# Patient Record
Sex: Female | Born: 1960
Health system: Southern US, Community
[De-identification: ages and names within clinical notes are randomized; demographics above are authoritative.]

## PROBLEM LIST (undated history)

## (undated) DIAGNOSIS — R030 Elevated blood-pressure reading, without diagnosis of hypertension: Secondary | ICD-10-CM

## (undated) DIAGNOSIS — Z973 Presence of spectacles and contact lenses: Secondary | ICD-10-CM

## (undated) DIAGNOSIS — M810 Age-related osteoporosis without current pathological fracture: Secondary | ICD-10-CM

## (undated) DIAGNOSIS — Z8719 Personal history of other diseases of the digestive system: Secondary | ICD-10-CM

## (undated) DIAGNOSIS — K5790 Diverticulosis of intestine, part unspecified, without perforation or abscess without bleeding: Secondary | ICD-10-CM

## (undated) DIAGNOSIS — D259 Leiomyoma of uterus, unspecified: Secondary | ICD-10-CM

## (undated) DIAGNOSIS — N950 Postmenopausal bleeding: Secondary | ICD-10-CM

## (undated) DIAGNOSIS — M8580 Other specified disorders of bone density and structure, unspecified site: Secondary | ICD-10-CM

## (undated) DIAGNOSIS — K219 Gastro-esophageal reflux disease without esophagitis: Secondary | ICD-10-CM

## (undated) HISTORY — DX: Postmenopausal bleeding: N95.0

## (undated) HISTORY — PX: UMBILICAL HERNIA REPAIR: SHX196

## (undated) HISTORY — PX: ABDOMINOPLASTY: SUR9

## (undated) HISTORY — DX: Age-related osteoporosis without current pathological fracture: M81.0

## (undated) HISTORY — PX: EPIGASTRIC HERNIA REPAIR: SUR1177

## (undated) HISTORY — DX: Diverticulosis of intestine, part unspecified, without perforation or abscess without bleeding: K57.90

## (undated) HISTORY — PX: HYSTEROSCOPY WITH D & C: SHX1775

## (undated) HISTORY — PX: HERNIA REPAIR: SHX51

## (undated) HISTORY — DX: Leiomyoma of uterus, unspecified: D25.9

## (undated) HISTORY — DX: Other specified disorders of bone density and structure, unspecified site: M85.80

## (undated) HISTORY — PX: DILATION AND CURETTAGE, DIAGNOSTIC / THERAPEUTIC: SUR384

## (undated) HISTORY — PX: INGUINAL HERNIA REPAIR: SUR1180

## (undated) HISTORY — DX: Gastro-esophageal reflux disease without esophagitis: K21.9

## (undated) HISTORY — DX: Personal history of other diseases of the digestive system: Z87.19

## (undated) HISTORY — PX: DILATION AND CURETTAGE OF UTERUS: SHX78

---

## 1998-04-28 ENCOUNTER — Encounter: Payer: Self-pay | Admitting: Sports Medicine

## 1998-04-28 ENCOUNTER — Ambulatory Visit (HOSPITAL_COMMUNITY): Admission: RE | Admit: 1998-04-28 | Discharge: 1998-04-28 | Payer: Self-pay | Admitting: Sports Medicine

## 1999-05-17 ENCOUNTER — Encounter: Admission: RE | Admit: 1999-05-17 | Discharge: 1999-08-15 | Payer: Self-pay | Admitting: Family Medicine

## 1999-11-22 ENCOUNTER — Ambulatory Visit (HOSPITAL_COMMUNITY): Admission: RE | Admit: 1999-11-22 | Discharge: 1999-11-22 | Payer: Self-pay | Admitting: Obstetrics & Gynecology

## 1999-11-22 ENCOUNTER — Encounter: Payer: Self-pay | Admitting: Obstetrics & Gynecology

## 1999-12-06 ENCOUNTER — Other Ambulatory Visit: Admission: RE | Admit: 1999-12-06 | Discharge: 1999-12-06 | Payer: Self-pay | Admitting: Obstetrics and Gynecology

## 2000-02-14 ENCOUNTER — Encounter: Payer: Self-pay | Admitting: Obstetrics and Gynecology

## 2000-02-14 ENCOUNTER — Ambulatory Visit (HOSPITAL_COMMUNITY): Admission: RE | Admit: 2000-02-14 | Discharge: 2000-02-14 | Payer: Self-pay | Admitting: Obstetrics and Gynecology

## 2000-03-21 ENCOUNTER — Encounter: Admission: RE | Admit: 2000-03-21 | Discharge: 2000-03-21 | Payer: Self-pay | Admitting: Family Medicine

## 2000-04-03 ENCOUNTER — Encounter: Admission: RE | Admit: 2000-04-03 | Discharge: 2000-04-03 | Payer: Self-pay | Admitting: Family Medicine

## 2000-07-05 ENCOUNTER — Inpatient Hospital Stay (HOSPITAL_COMMUNITY): Admission: AD | Admit: 2000-07-05 | Discharge: 2000-07-08 | Payer: Self-pay | Admitting: Obstetrics and Gynecology

## 2000-07-05 ENCOUNTER — Encounter (INDEPENDENT_AMBULATORY_CARE_PROVIDER_SITE_OTHER): Payer: Self-pay | Admitting: Specialist

## 2000-11-08 ENCOUNTER — Encounter (HOSPITAL_BASED_OUTPATIENT_CLINIC_OR_DEPARTMENT_OTHER): Payer: Self-pay | Admitting: General Surgery

## 2000-11-13 ENCOUNTER — Encounter (INDEPENDENT_AMBULATORY_CARE_PROVIDER_SITE_OTHER): Payer: Self-pay | Admitting: *Deleted

## 2000-11-13 ENCOUNTER — Ambulatory Visit (HOSPITAL_COMMUNITY): Admission: RE | Admit: 2000-11-13 | Discharge: 2000-11-13 | Payer: Self-pay | Admitting: General Surgery

## 2000-12-31 ENCOUNTER — Encounter (INDEPENDENT_AMBULATORY_CARE_PROVIDER_SITE_OTHER): Payer: Self-pay | Admitting: *Deleted

## 2000-12-31 ENCOUNTER — Ambulatory Visit (HOSPITAL_COMMUNITY): Admission: RE | Admit: 2000-12-31 | Discharge: 2000-12-31 | Payer: Self-pay | Admitting: General Surgery

## 2001-05-01 ENCOUNTER — Other Ambulatory Visit: Admission: RE | Admit: 2001-05-01 | Discharge: 2001-05-01 | Payer: Self-pay | Admitting: Obstetrics and Gynecology

## 2001-08-21 ENCOUNTER — Encounter: Admission: RE | Admit: 2001-08-21 | Discharge: 2001-11-19 | Payer: Self-pay | Admitting: Sports Medicine

## 2001-10-29 ENCOUNTER — Encounter: Admission: RE | Admit: 2001-10-29 | Discharge: 2001-10-29 | Payer: Self-pay | Admitting: Sports Medicine

## 2001-12-03 ENCOUNTER — Inpatient Hospital Stay (HOSPITAL_COMMUNITY): Admission: RE | Admit: 2001-12-03 | Discharge: 2001-12-06 | Payer: Self-pay | Admitting: Plastic Surgery

## 2001-12-30 ENCOUNTER — Encounter: Admission: RE | Admit: 2001-12-30 | Discharge: 2001-12-30 | Payer: Self-pay | Admitting: Family Medicine

## 2002-03-03 ENCOUNTER — Encounter: Admission: RE | Admit: 2002-03-03 | Discharge: 2002-03-03 | Payer: Self-pay | Admitting: Family Medicine

## 2002-05-26 ENCOUNTER — Encounter: Admission: RE | Admit: 2002-05-26 | Discharge: 2002-05-26 | Payer: Self-pay | Admitting: Family Medicine

## 2002-07-08 ENCOUNTER — Ambulatory Visit (HOSPITAL_COMMUNITY): Admission: RE | Admit: 2002-07-08 | Discharge: 2002-07-08 | Payer: Self-pay | Admitting: Family Medicine

## 2002-07-08 ENCOUNTER — Encounter: Admission: RE | Admit: 2002-07-08 | Discharge: 2002-07-08 | Payer: Self-pay | Admitting: Family Medicine

## 2002-07-08 ENCOUNTER — Encounter: Payer: Self-pay | Admitting: Family Medicine

## 2002-07-17 ENCOUNTER — Encounter: Admission: RE | Admit: 2002-07-17 | Discharge: 2002-07-17 | Payer: Self-pay | Admitting: Family Medicine

## 2002-08-19 ENCOUNTER — Other Ambulatory Visit: Admission: RE | Admit: 2002-08-19 | Discharge: 2002-08-19 | Payer: Self-pay | Admitting: Gynecology

## 2003-05-26 ENCOUNTER — Ambulatory Visit (HOSPITAL_COMMUNITY): Admission: RE | Admit: 2003-05-26 | Discharge: 2003-05-26 | Payer: Self-pay | Admitting: Family Medicine

## 2003-05-26 ENCOUNTER — Encounter: Admission: RE | Admit: 2003-05-26 | Discharge: 2003-05-26 | Payer: Self-pay | Admitting: Family Medicine

## 2003-10-27 ENCOUNTER — Other Ambulatory Visit: Admission: RE | Admit: 2003-10-27 | Discharge: 2003-10-27 | Payer: Self-pay | Admitting: Gynecology

## 2003-11-06 ENCOUNTER — Encounter: Admission: RE | Admit: 2003-11-06 | Discharge: 2003-11-06 | Payer: Self-pay | Admitting: Sports Medicine

## 2003-12-31 ENCOUNTER — Ambulatory Visit (HOSPITAL_COMMUNITY): Admission: RE | Admit: 2003-12-31 | Discharge: 2003-12-31 | Payer: Self-pay | Admitting: *Deleted

## 2003-12-31 IMAGING — US US ABDOMEN COMPLETE
1 series · 14 of 25 positions shown · non-contrast
Comparison: none

CLINICAL DATA: Patient has epigastric pain.
 ULTRASOUND OF THE ABDOMEN
 The gallbladder is well distended without evidence of stones.  Gallbladder wall is normal measuring 1.9mm.  Common bile duct is normal measuring 4mm.  Liver, IVC, pancreas and spleen are unremarkable.  Right kidney measures 10.4cm.  Left kidney measures 11.1cm with a 3 x 2mm echogenic area in the lower pole of the left kidney.  This may well represent a small stone or an angiomyolipoma.  Aorta is normal measuring 1.7cm. 
 IMPRESSION
 No gallstones.
 Small echogenic area of the left kidney.  Followup study would be suggested in six months for further evaluation of the left kidney.

[Series 1: unknown · 0.30mm/px · 14 of 93 slices shown]
[im 1/93]
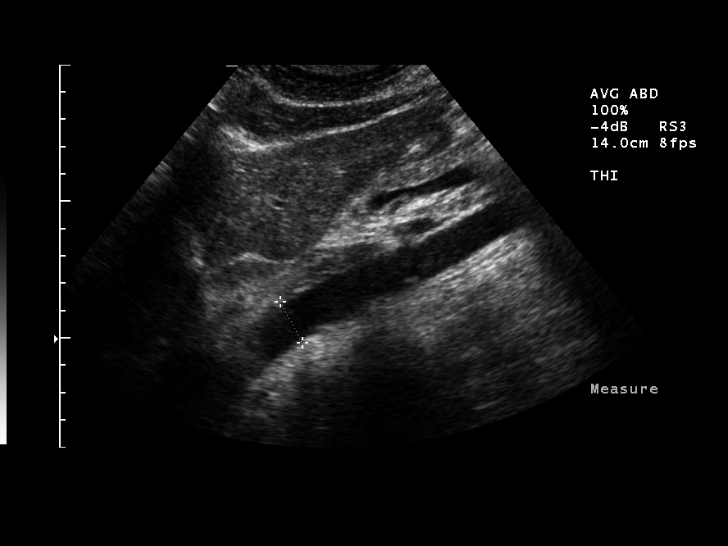
[im 8/93]
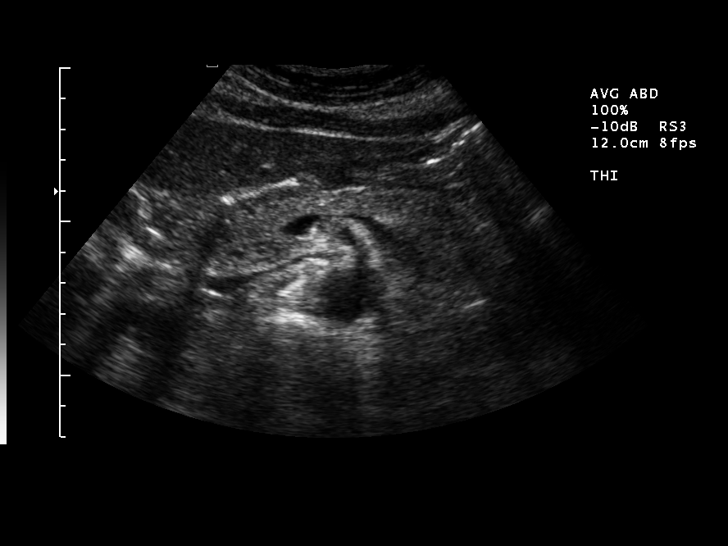
[im 16/93]
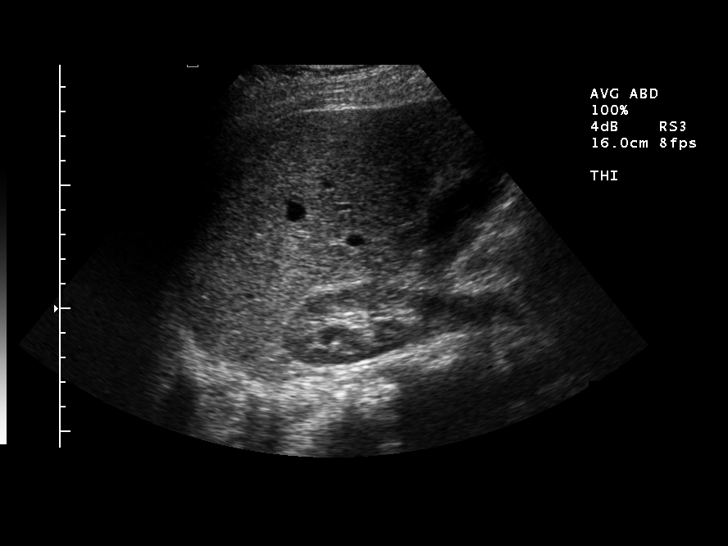
[im 24/93]
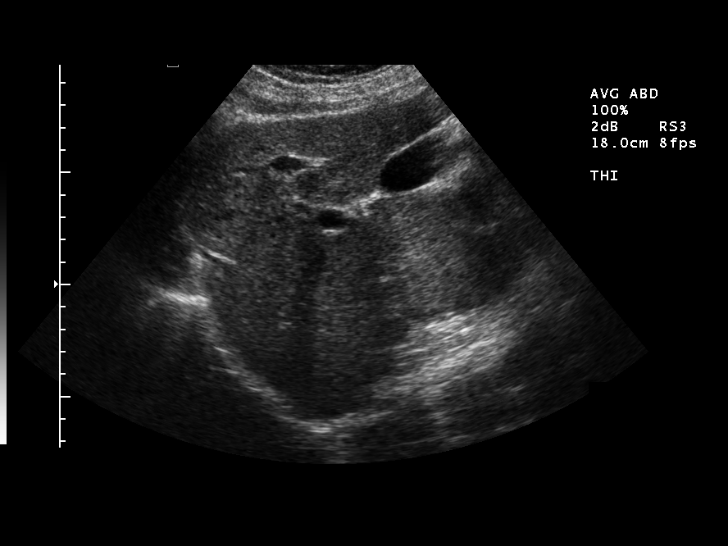
[im 31/93]
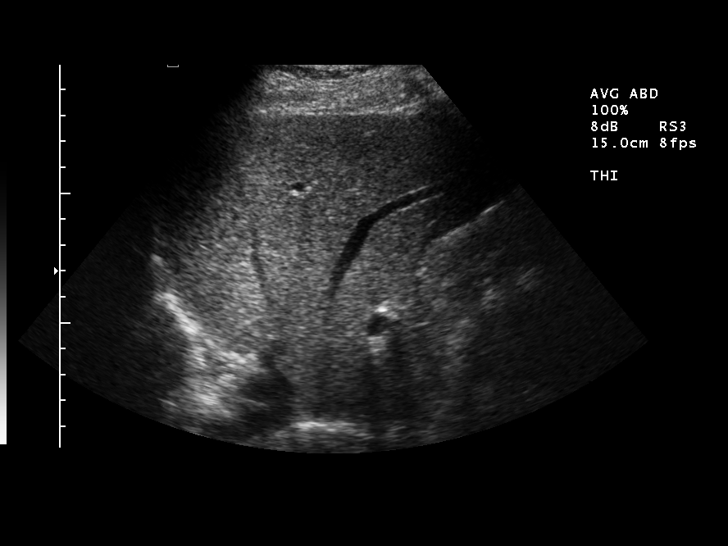
[im 35/93]
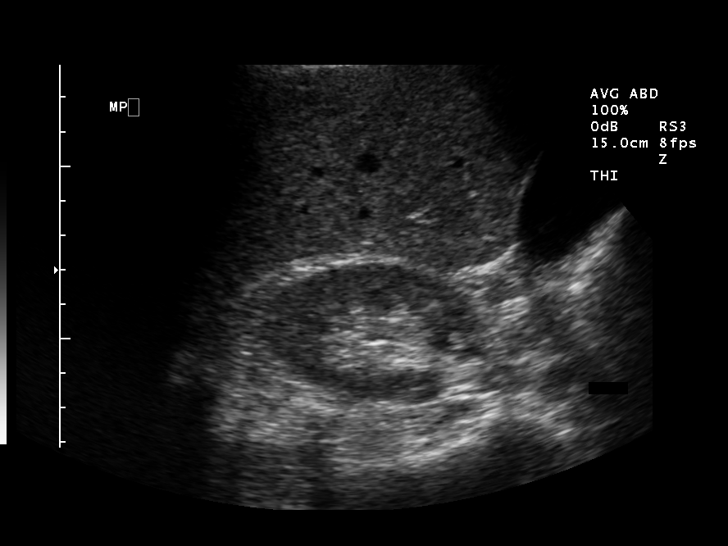
[im 43/93]
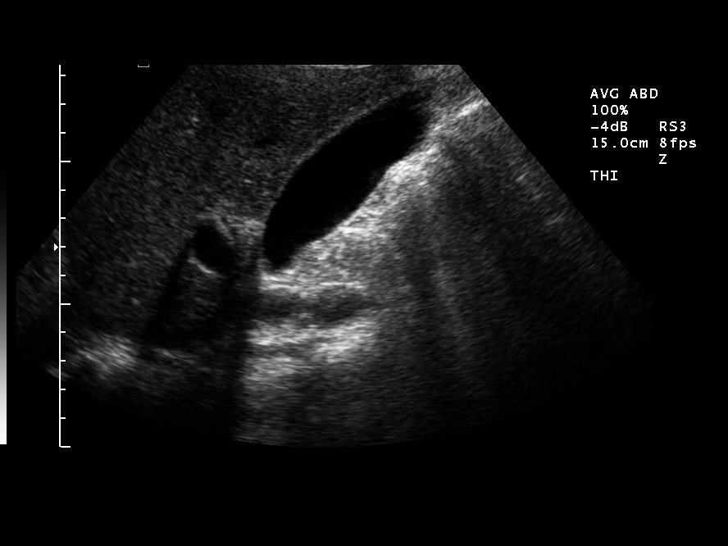
[im 50/93]
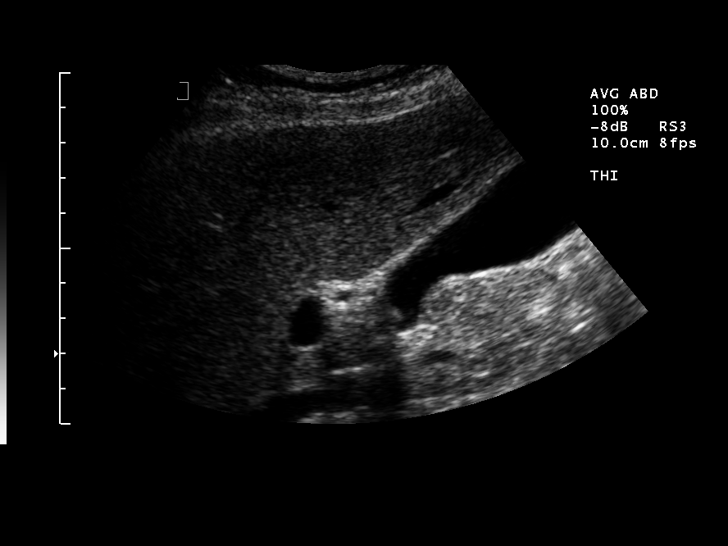
[im 58/93]
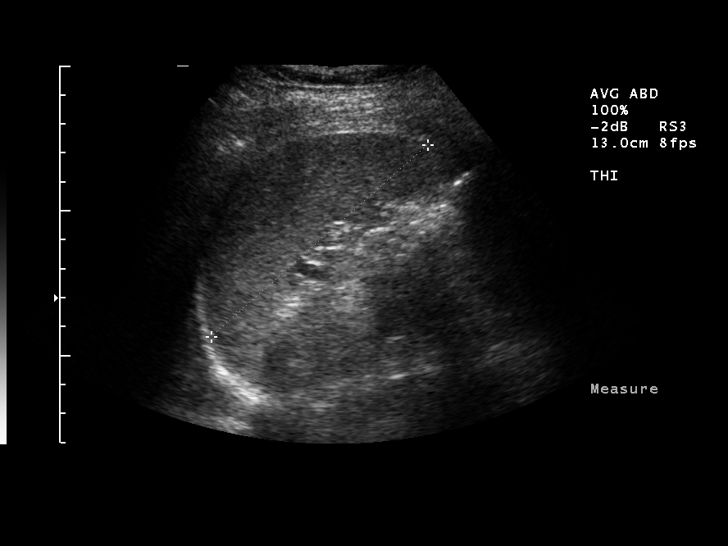
[im 62/93]
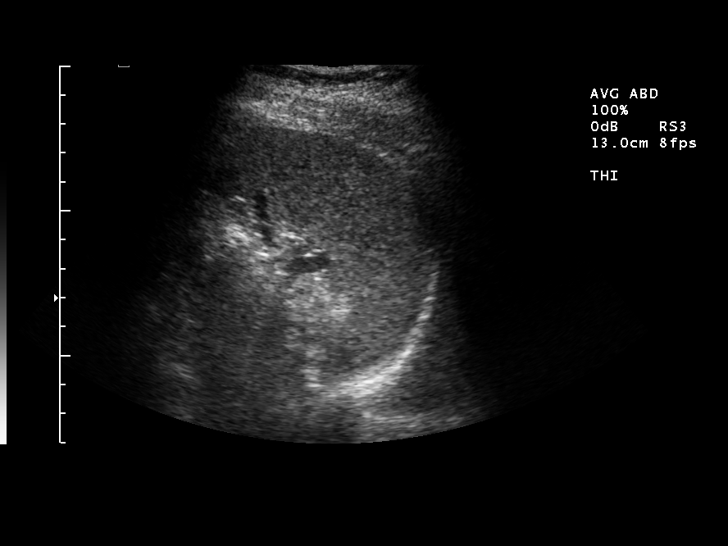
[im 70/93]
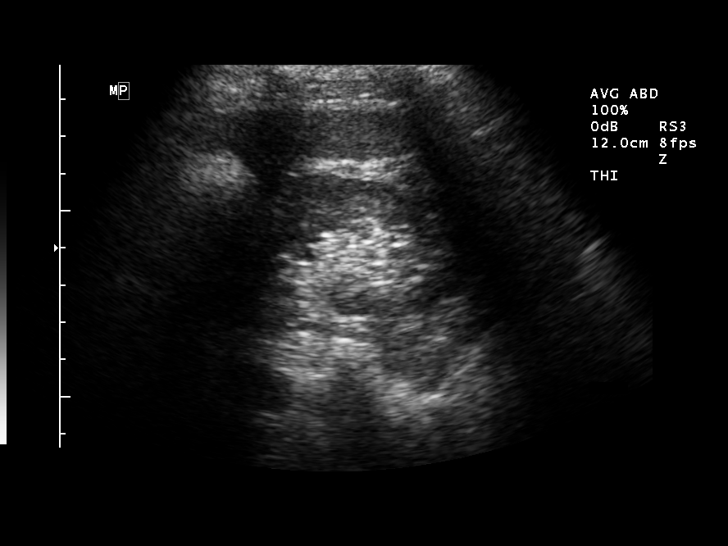
[im 77/93]
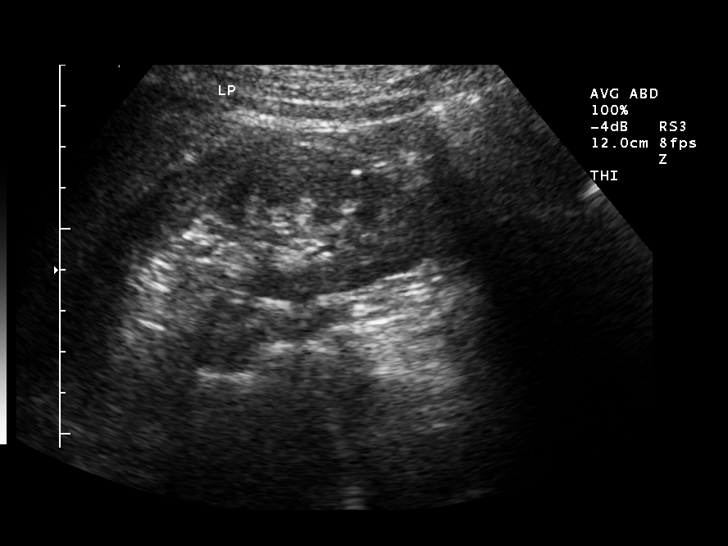
[im 85/93]
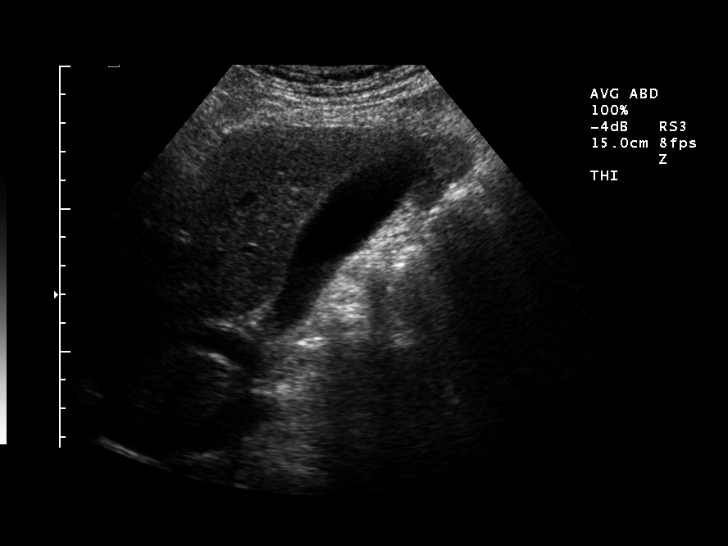
[im 93/93]
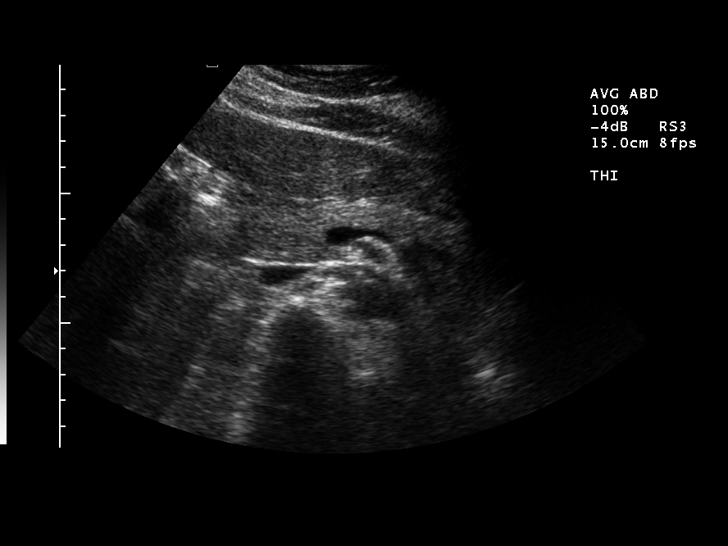

[14 of 25 positions shown; findings below may reference images not displayed]

## 2004-01-07 ENCOUNTER — Ambulatory Visit (HOSPITAL_COMMUNITY): Admission: RE | Admit: 2004-01-07 | Discharge: 2004-01-07 | Payer: Self-pay | Admitting: *Deleted

## 2004-01-07 ENCOUNTER — Encounter (INDEPENDENT_AMBULATORY_CARE_PROVIDER_SITE_OTHER): Payer: Self-pay | Admitting: *Deleted

## 2004-02-16 ENCOUNTER — Ambulatory Visit: Payer: Self-pay | Admitting: Family Medicine

## 2004-04-28 ENCOUNTER — Ambulatory Visit (HOSPITAL_COMMUNITY): Admission: RE | Admit: 2004-04-28 | Discharge: 2004-04-28 | Payer: Self-pay | Admitting: Family Medicine

## 2004-04-28 IMAGING — CT CT ABDOMEN WO/W CM
2 of 4 series · 14 of 32 positions shown, 19 images · IV contrast (omnipaque)
Comparison: Ultrasound of the abdomen [DATE].

CLINICAL DATA: Follow-up left kidney abnormality on ultrasound.
 CT ABDOMEN WITHOUT AND WITH CONTRAST:
 150 cc Omnipaque 300 IV.

[Series 2: abdomen w/o 5.0 b30f · axial · non-contrast · 0.62mm/px · z∈[-370,-230]mm · 6 of 45 slices shown]
[im 6/45  soft-tissue]
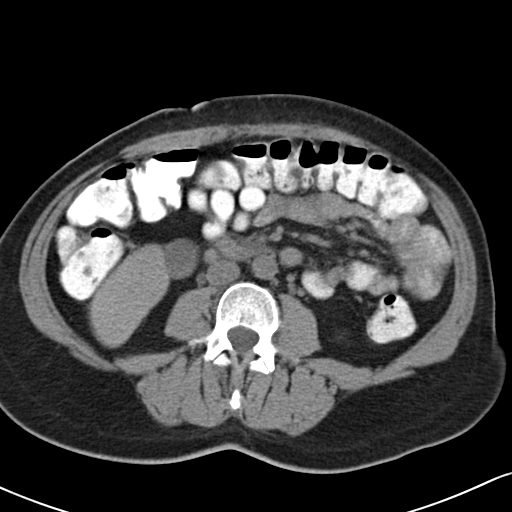
[im 12/45  soft-tissue]
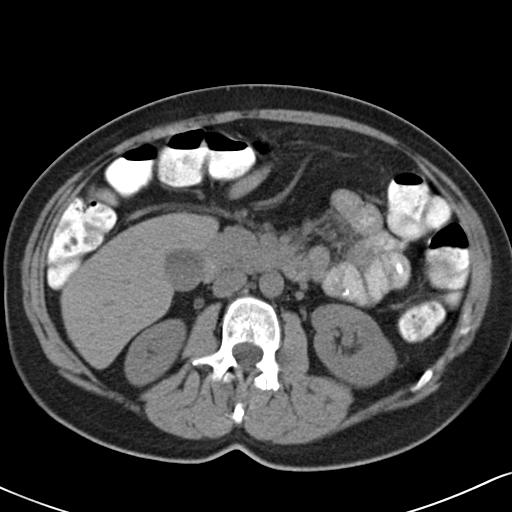
[im 17/45  soft-tissue]
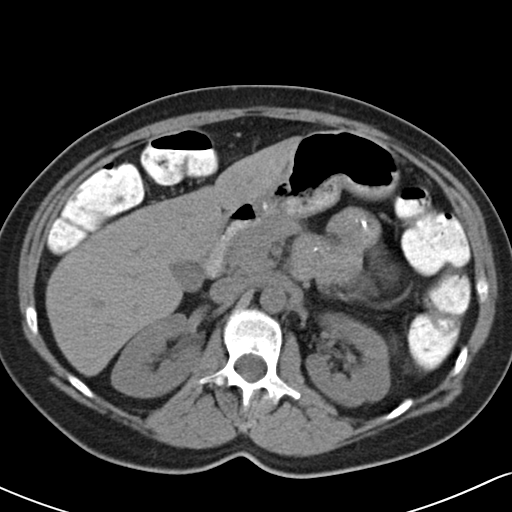
[im 23/45  soft-tissue]
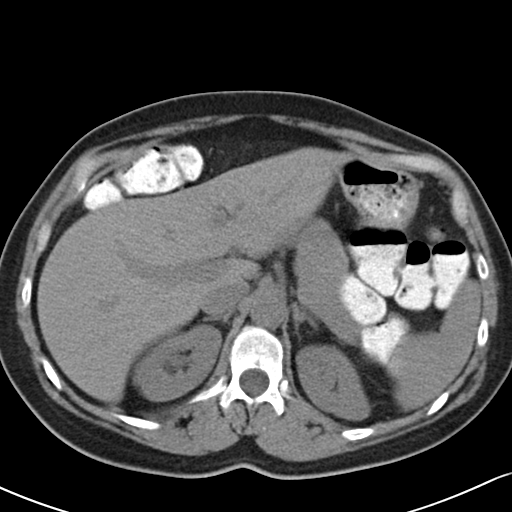
[im 28/45  soft-tissue]
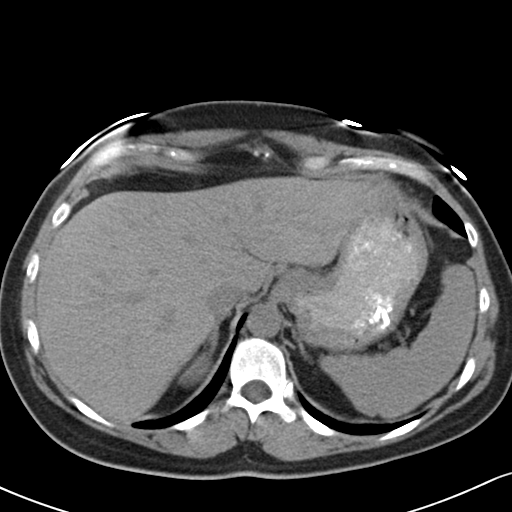
[im 34/45  soft-tissue]
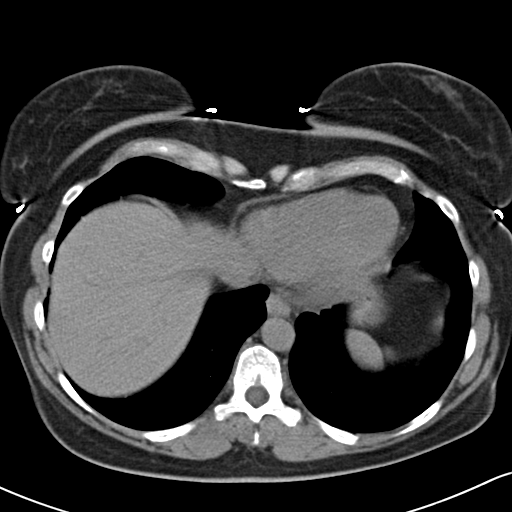

[Series 3: abdomen 5.0 b30f · axial · 0.62mm/px · z∈[-380,-205]mm · 8 of 47 slices shown, 13 images]
[im 6/47  soft-tissue]
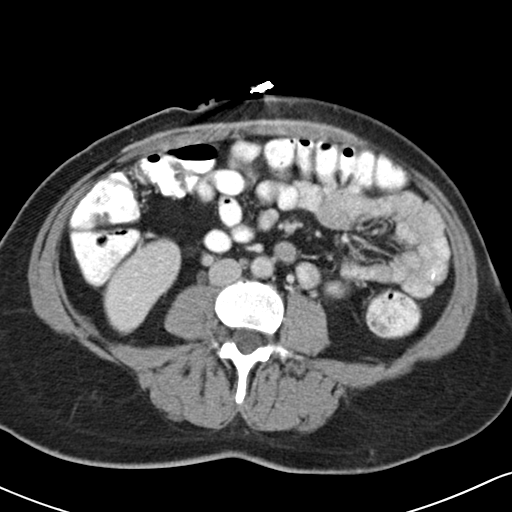
[im 6/47  bone]
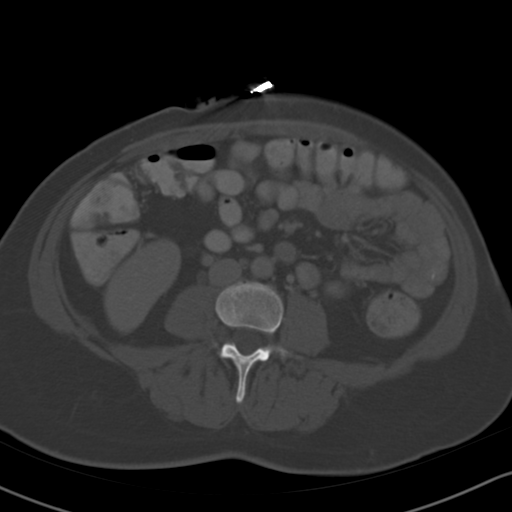
[im 11/47  soft-tissue]
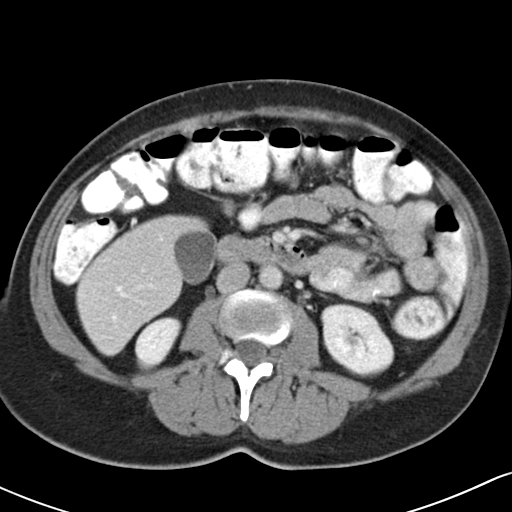
[im 16/47  soft-tissue]
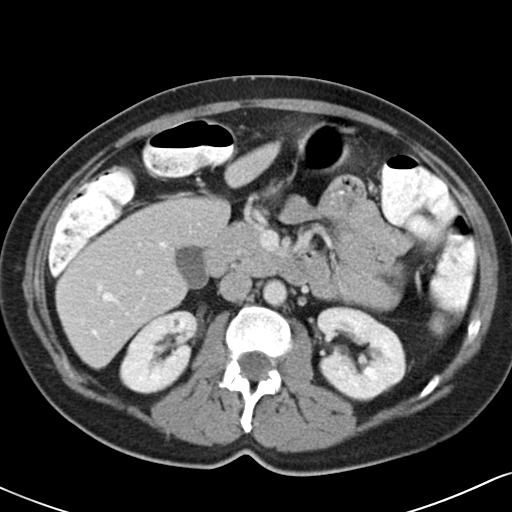
[im 21/47  soft-tissue]
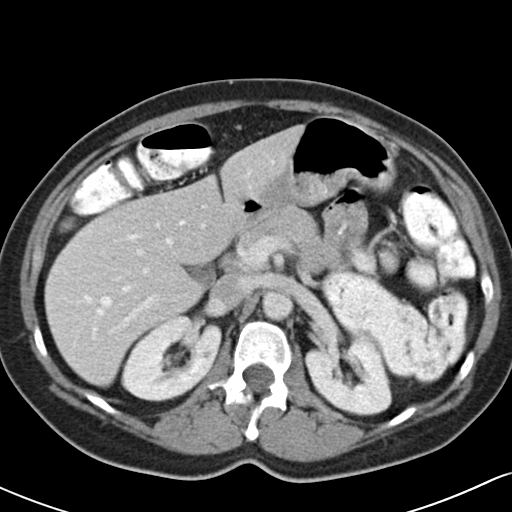
[im 26/47  soft-tissue]
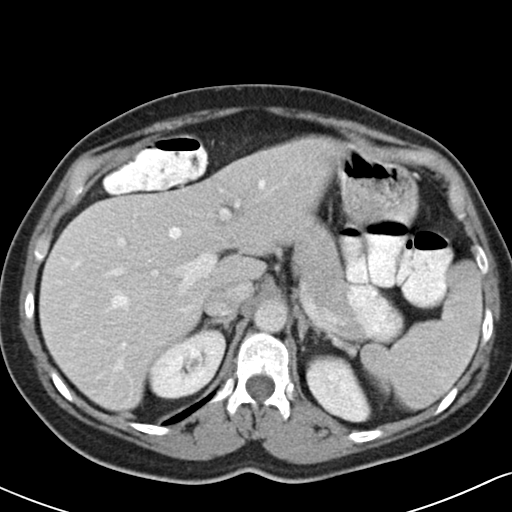
[im 26/47  lung]
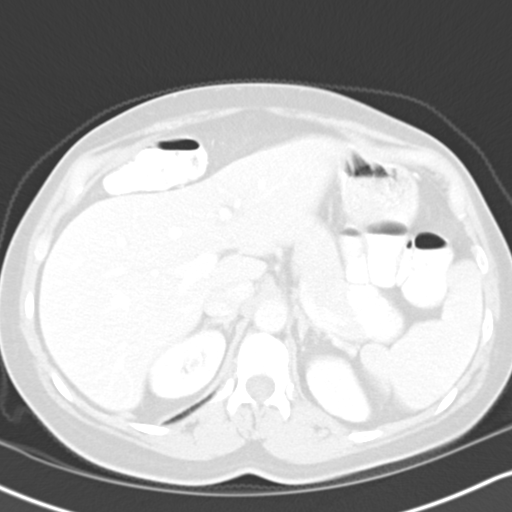
[im 31/47  soft-tissue]
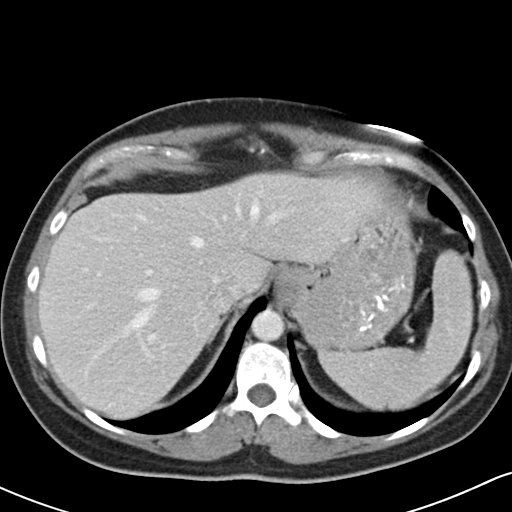
[im 31/47  lung]
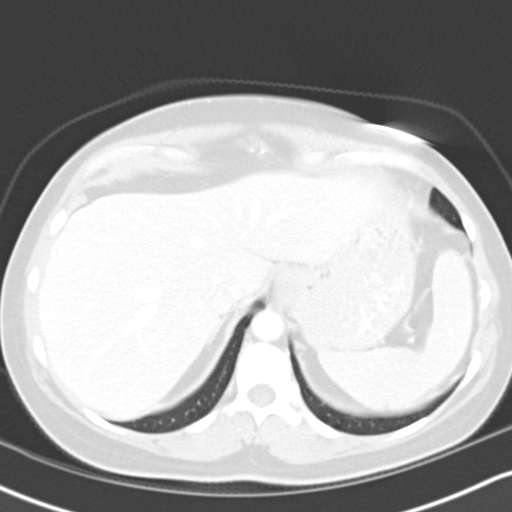
[im 36/47  soft-tissue]
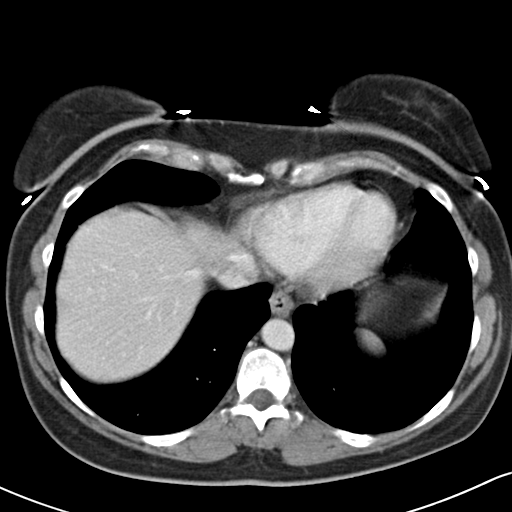
[im 36/47  lung]
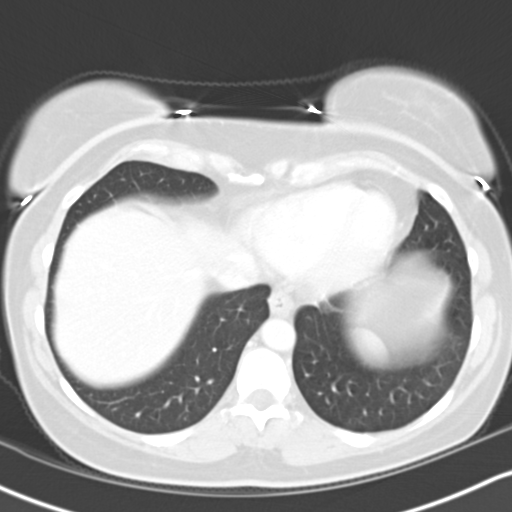
[im 41/47  soft-tissue]
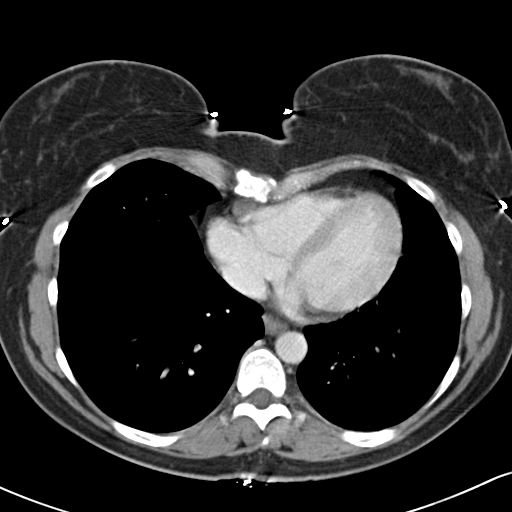
[im 41/47  lung]
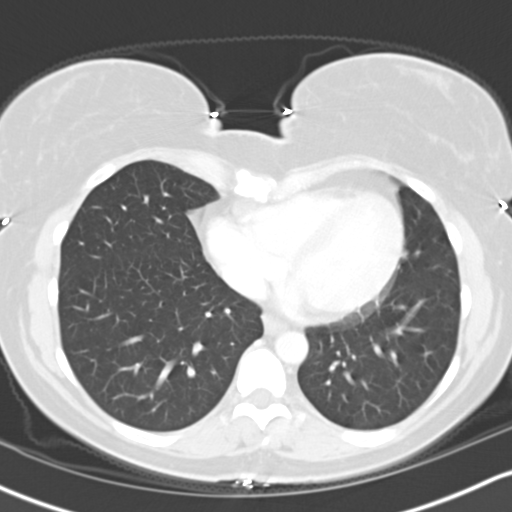

[14 of 32 positions shown; findings below may reference images not displayed]

The prior ultrasound showed a 3 mm echogenic non-shadowing focus in the left lower renal cortex.  No abnormality is identified on this area on CT.  Specifically, there is no renal calcification, cyst, or fatty mass.  Etiology is uncertain but this may represent a small complex cyst, which has resolved, possibly a small fatty tumor which is not visualized on CT.  
 There is a 4 mm low density lesion in the right mid renal medial cortex seen on the CT but not seen in retrospect on the ultrasound.  This is most likely a benign lesion and probably a small cyst.  It has density measurements between approximately 2 and 25.  No renal calculi are identified.  There is no hydronephrosis and no other renal lesions are identified.  
 The liver and spleen are normal.  The lung bases are normal.  The pancreas is normal.  There is no adenopathy or fluid.  The bowel is not dilated.  The hepatic flexure of the colon does extend anterior to the liver, which is a normal variant.
IMPRESSION: 1.  No renal lesion is seen on the left kidney corresponding to the ultrasound abnormality.  I doubt that this represents a significant finding with discussion of above.  
 2  4 mm lesion in the right kidney, which is most likely a small cyst, which is not seen on the recent ultrasound.

## 2005-05-04 ENCOUNTER — Other Ambulatory Visit: Admission: RE | Admit: 2005-05-04 | Discharge: 2005-05-04 | Payer: Self-pay | Admitting: Gynecology

## 2005-09-15 ENCOUNTER — Encounter: Admission: RE | Admit: 2005-09-15 | Discharge: 2005-09-15 | Payer: Self-pay | Admitting: Family Medicine

## 2006-05-17 ENCOUNTER — Ambulatory Visit: Payer: Self-pay | Admitting: Family Medicine

## 2006-08-10 ENCOUNTER — Ambulatory Visit: Payer: Self-pay | Admitting: Family Medicine

## 2006-08-10 LAB — CONVERTED CEMR LAB: Rapid Strep: NEGATIVE

## 2006-08-11 ENCOUNTER — Encounter: Payer: Self-pay | Admitting: Family Medicine

## 2006-09-17 ENCOUNTER — Encounter: Admission: RE | Admit: 2006-09-17 | Discharge: 2006-09-17 | Payer: Self-pay | Admitting: Family Medicine

## 2006-09-18 ENCOUNTER — Encounter: Payer: Self-pay | Admitting: Family Medicine

## 2006-12-11 ENCOUNTER — Ambulatory Visit: Payer: Self-pay | Admitting: Family Medicine

## 2006-12-12 ENCOUNTER — Encounter: Payer: Self-pay | Admitting: Family Medicine

## 2006-12-12 LAB — CONVERTED CEMR LAB
Albumin: 4.4 g/dL (ref 3.5–5.2)
Creatinine, Ser: 0.74 mg/dL (ref 0.40–1.20)
Glucose, Bld: 136 mg/dL — ABNORMAL HIGH (ref 70–99)
Potassium: 4.3 meq/L (ref 3.5–5.3)
Total Bilirubin: 0.5 mg/dL (ref 0.3–1.2)

## 2007-02-26 ENCOUNTER — Ambulatory Visit: Payer: Self-pay | Admitting: Family Medicine

## 2007-02-26 DIAGNOSIS — R109 Unspecified abdominal pain: Secondary | ICD-10-CM | POA: Insufficient documentation

## 2007-02-26 LAB — CONVERTED CEMR LAB
AST: 15 units/L (ref 0–37)
BUN: 9 mg/dL (ref 6–23)
Chloride: 103 meq/L (ref 96–112)
Hemoglobin: 13.7 g/dL (ref 12.0–15.0)
MCV: 88.5 fL (ref 78.0–100.0)
Platelets: 307 10*3/uL (ref 150–400)
Potassium: 4 meq/L (ref 3.5–5.3)
Sodium: 139 meq/L (ref 135–145)
Total Protein: 6.8 g/dL (ref 6.0–8.3)
WBC: 5.8 10*3/uL (ref 4.0–10.5)

## 2007-05-03 ENCOUNTER — Encounter: Payer: Self-pay | Admitting: Family Medicine

## 2007-05-09 ENCOUNTER — Ambulatory Visit: Payer: Self-pay | Admitting: Family Medicine

## 2007-05-09 DIAGNOSIS — M25579 Pain in unspecified ankle and joints of unspecified foot: Secondary | ICD-10-CM | POA: Insufficient documentation

## 2007-06-05 ENCOUNTER — Telehealth: Payer: Self-pay | Admitting: Family Medicine

## 2007-06-07 ENCOUNTER — Ambulatory Visit: Payer: Self-pay | Admitting: Family Medicine

## 2007-06-07 ENCOUNTER — Ambulatory Visit (HOSPITAL_COMMUNITY): Admission: RE | Admit: 2007-06-07 | Discharge: 2007-06-07 | Payer: Self-pay | Admitting: Family Medicine

## 2007-06-10 ENCOUNTER — Telehealth: Payer: Self-pay | Admitting: *Deleted

## 2007-06-11 ENCOUNTER — Encounter: Payer: Self-pay | Admitting: Family Medicine

## 2007-06-26 ENCOUNTER — Encounter: Payer: Self-pay | Admitting: Family Medicine

## 2007-07-02 ENCOUNTER — Ambulatory Visit: Payer: Self-pay | Admitting: Family Medicine

## 2007-07-03 ENCOUNTER — Ambulatory Visit (HOSPITAL_COMMUNITY): Admission: RE | Admit: 2007-07-03 | Discharge: 2007-07-03 | Payer: Self-pay | Admitting: Interventional Cardiology

## 2007-07-03 ENCOUNTER — Encounter (INDEPENDENT_AMBULATORY_CARE_PROVIDER_SITE_OTHER): Payer: Self-pay | Admitting: Interventional Cardiology

## 2007-07-11 DIAGNOSIS — E669 Obesity, unspecified: Secondary | ICD-10-CM | POA: Insufficient documentation

## 2007-08-16 ENCOUNTER — Encounter: Payer: Self-pay | Admitting: Family Medicine

## 2007-08-30 ENCOUNTER — Ambulatory Visit: Payer: Self-pay | Admitting: Sports Medicine

## 2007-08-30 ENCOUNTER — Encounter: Admission: RE | Admit: 2007-08-30 | Discharge: 2007-08-30 | Payer: Self-pay | Admitting: Sports Medicine

## 2007-08-30 DIAGNOSIS — M2170 Unequal limb length (acquired), unspecified site: Secondary | ICD-10-CM

## 2007-08-30 DIAGNOSIS — M4130 Thoracogenic scoliosis, site unspecified: Secondary | ICD-10-CM | POA: Insufficient documentation

## 2007-08-30 DIAGNOSIS — M79673 Pain in unspecified foot: Secondary | ICD-10-CM | POA: Insufficient documentation

## 2007-08-30 DIAGNOSIS — M2020 Hallux rigidus, unspecified foot: Secondary | ICD-10-CM

## 2007-08-30 HISTORY — DX: Thoracogenic scoliosis, site unspecified: M41.30

## 2007-08-30 HISTORY — DX: Unequal limb length (acquired), unspecified site: M21.70

## 2007-08-30 IMAGING — CR DG FOOT COMPLETE 3+V*L*
3 series · 3 of 3 positions shown · non-contrast
Comparison: None

CLINICAL DATA: Pain, no acute injury

LEFT FOOT - COMPLETE 3+ VIEW

[view not recorded (1 of 3)]
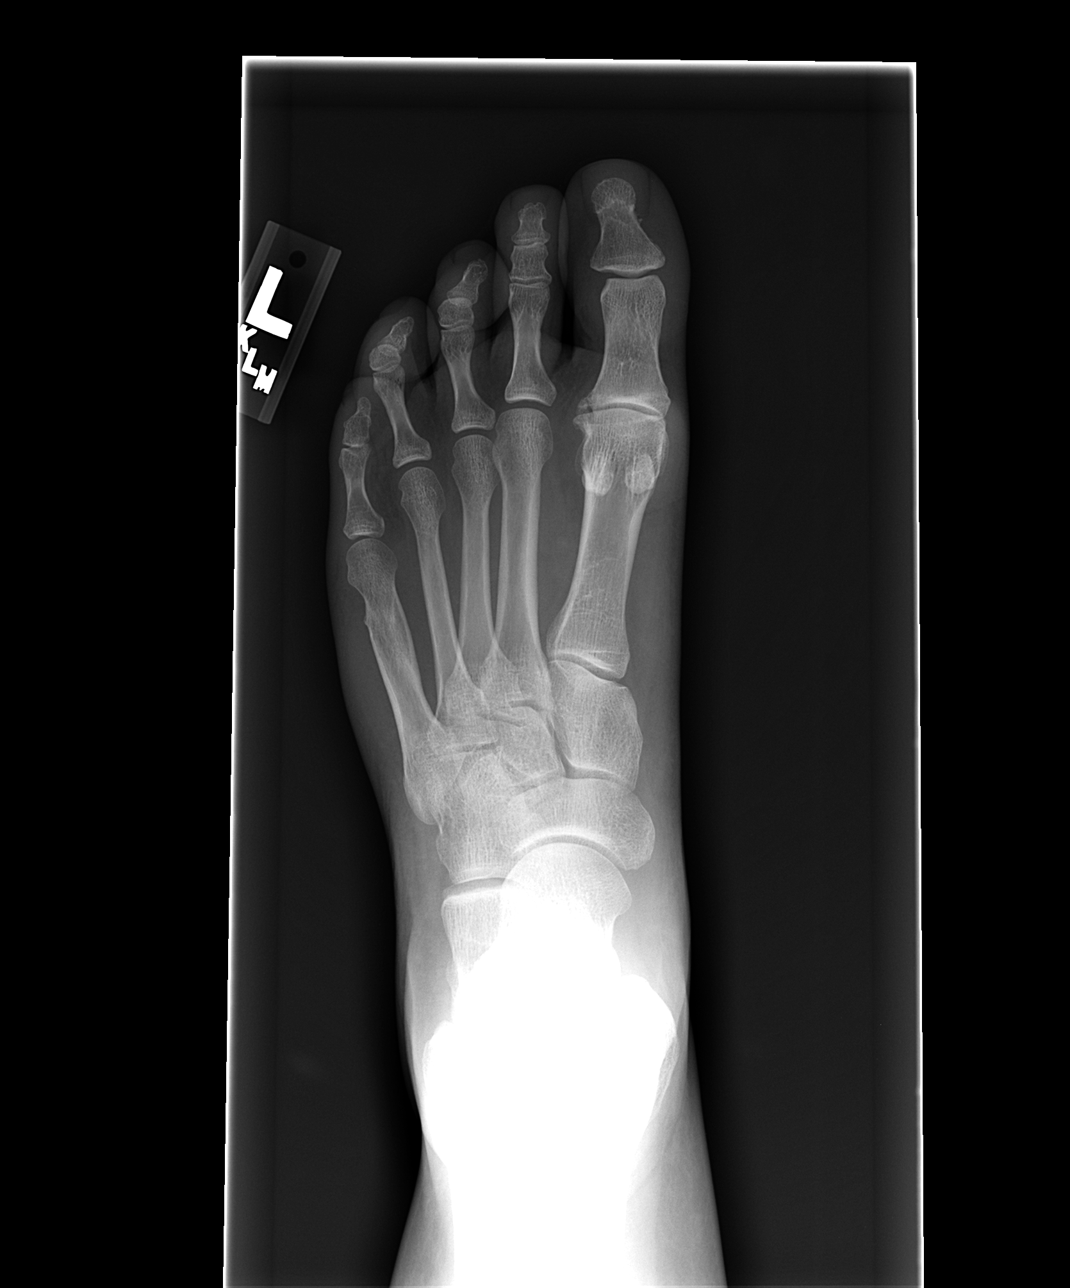

[view not recorded (2 of 3)]
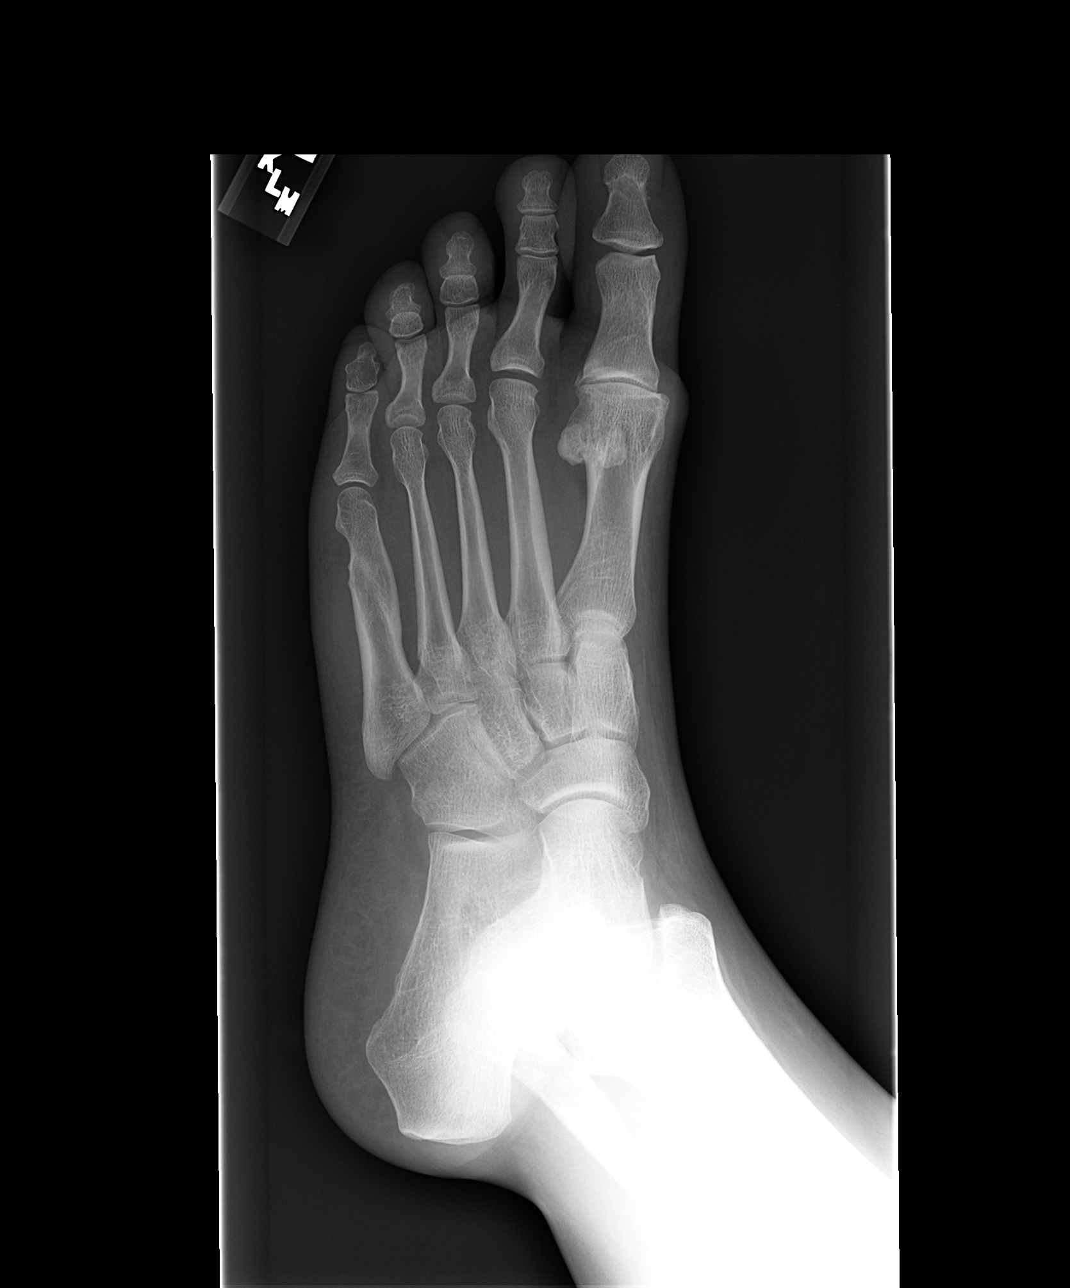

[view not recorded (3 of 3)]
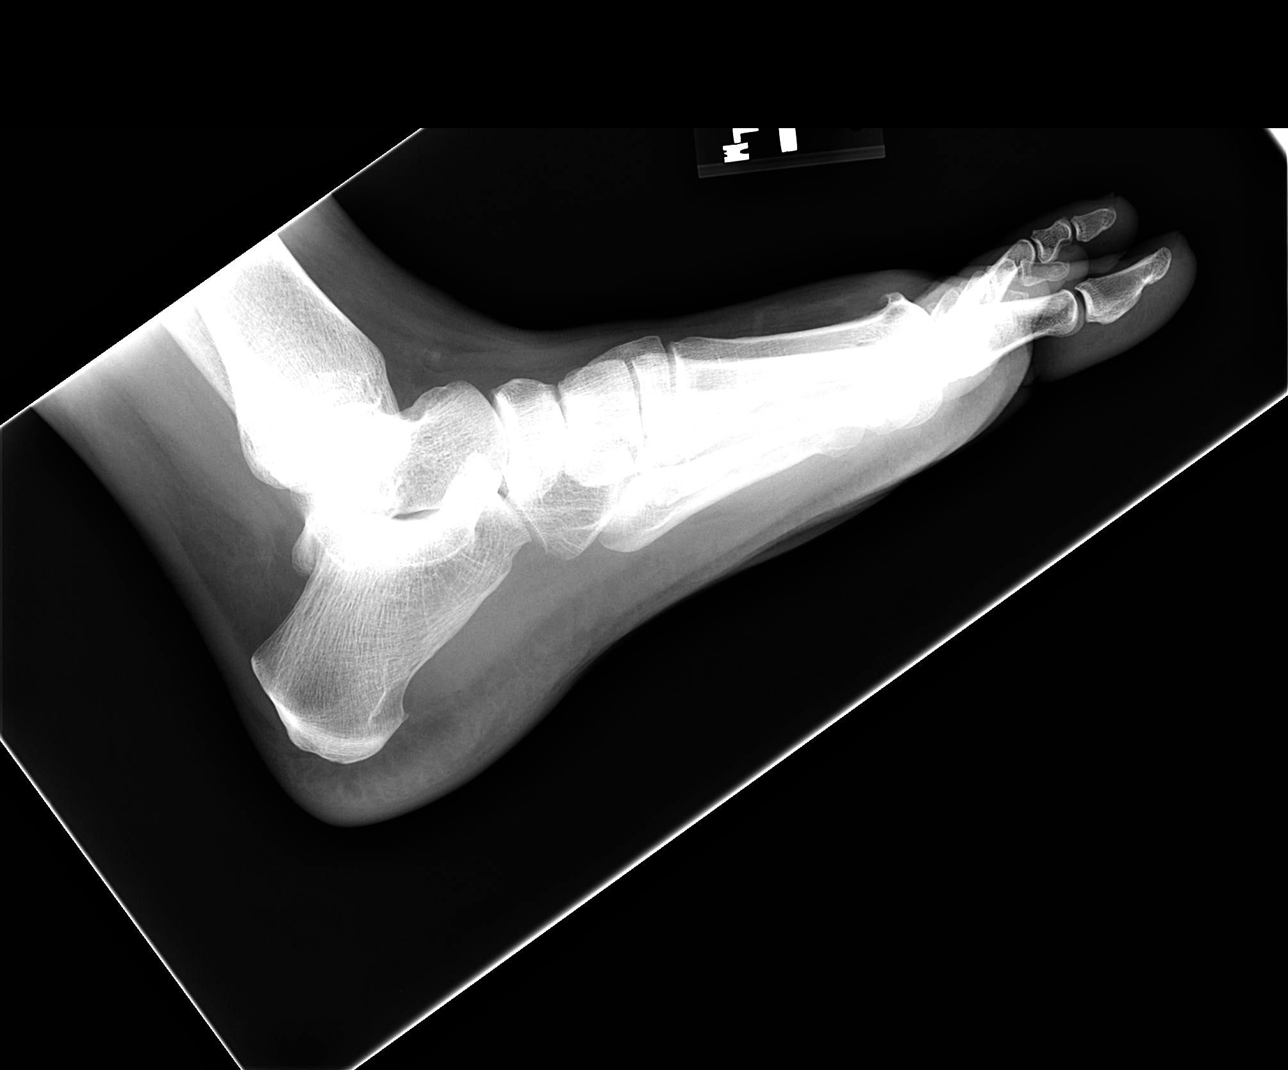

[3 of 3 positions shown; findings below may reference images not displayed]

FINDINGS: There is an old healed fracture of the distal left fifth
metatarsal.  There is degenerative change at the left first MTP
joint with loss of joint space sclerosis and spur formation.  There
is some cortical irregularity of the lateral aspect of the base of
the proximal phalanx of the left great toe which could represent
small fracture.
IMPRESSION: 1.  Cannot include fracture of the base of the proximal phalanx of
the left great toe laterally with soft tissue swelling and
degenerative change present.
2.  Old healed fracture of the left fifth metatarsal.

## 2007-09-26 ENCOUNTER — Encounter: Admission: RE | Admit: 2007-09-26 | Discharge: 2007-09-26 | Payer: Self-pay | Admitting: Family Medicine

## 2007-10-16 ENCOUNTER — Encounter: Admission: RE | Admit: 2007-10-16 | Discharge: 2007-10-16 | Payer: Self-pay | Admitting: *Deleted

## 2007-10-16 ENCOUNTER — Ambulatory Visit: Payer: Self-pay | Admitting: Family Medicine

## 2007-10-16 DIAGNOSIS — M542 Cervicalgia: Secondary | ICD-10-CM | POA: Insufficient documentation

## 2007-10-16 DIAGNOSIS — M25539 Pain in unspecified wrist: Secondary | ICD-10-CM | POA: Insufficient documentation
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
## 2007-10-17 ENCOUNTER — Encounter (INDEPENDENT_AMBULATORY_CARE_PROVIDER_SITE_OTHER): Payer: Self-pay | Admitting: *Deleted

## 2008-05-06 ENCOUNTER — Other Ambulatory Visit: Admission: RE | Admit: 2008-05-06 | Discharge: 2008-05-06 | Payer: Self-pay | Admitting: Gynecology

## 2008-05-08 ENCOUNTER — Encounter: Payer: Self-pay | Admitting: Family Medicine

## 2008-06-10 ENCOUNTER — Encounter (INDEPENDENT_AMBULATORY_CARE_PROVIDER_SITE_OTHER): Payer: Self-pay | Admitting: *Deleted

## 2008-06-10 ENCOUNTER — Ambulatory Visit: Payer: Self-pay | Admitting: Sports Medicine

## 2008-06-10 DIAGNOSIS — M25569 Pain in unspecified knee: Secondary | ICD-10-CM

## 2008-08-03 ENCOUNTER — Encounter: Payer: Self-pay | Admitting: Family Medicine

## 2008-08-04 ENCOUNTER — Ambulatory Visit (HOSPITAL_COMMUNITY): Admission: RE | Admit: 2008-08-04 | Discharge: 2008-08-04 | Payer: Self-pay | Admitting: *Deleted

## 2008-11-03 ENCOUNTER — Encounter: Admission: RE | Admit: 2008-11-03 | Discharge: 2008-11-03 | Payer: Self-pay | Admitting: Family Medicine

## 2009-12-07 ENCOUNTER — Encounter: Admission: RE | Admit: 2009-12-07 | Discharge: 2009-12-07 | Payer: Self-pay | Admitting: Family Medicine

## 2009-12-15 ENCOUNTER — Ambulatory Visit: Payer: Self-pay | Admitting: Sports Medicine

## 2009-12-15 DIAGNOSIS — IMO0002 Reserved for concepts with insufficient information to code with codable children: Secondary | ICD-10-CM

## 2010-06-18 ENCOUNTER — Encounter: Payer: Self-pay | Admitting: Family Medicine

## 2010-06-19 ENCOUNTER — Encounter: Payer: Self-pay | Admitting: *Deleted

## 2010-06-28 NOTE — Assessment & Plan Note (Signed)
Summary: SHIN SPLINTS   Vital Signs:  Patient profile:   50 year old female Pulse rate:   72 / minute BP sitting:   142 / 96  (right arm)  Vitals Entered By: Terese Door (December 15, 2009 11:22 AM) CC: shin splints   CC:  shin splints.  History of Present Illness: 4-5 year history of intermittent anterior shin pain. Severe episode precipitated by 2 day period of prolonged standing at a trade show, pain has persisted since, particularily on R. Patient describes a burning pain down the anterior aspect of the shin, getting steadily worse throughout the day. Has gotten some relief form Advil and voltarol gel.  Past medical history significant for arthritis of L Hallux last 2 years, treated with orthotic pads and voltarol gel.  Physical Exam  General:  Well-developed,well-nourished,in no acute distress; alert,appropriate and cooperative throughout examination Msk:  Hyperpronation of both feet.  Extension of both Halluces severely limited by Osteoarthritis. marked spurring of left 1st MTP some of RT 1st MTP Tenderness over anterior border of both tibiae on palpation.  norm alignment  leg length is slt off when lying but partially corrects w sitting  FABER hip exam were norm abduction is weak bilat   Impression & Recommendations:  Problem # 1:  SHIN SPLINTS (ICD-844.9)  given series of exercises toe, heel walking and also to use icing start ext ext for leg and toes  try compression sleeve over ant shins bilat  hip exercises to strengthen core  Orders: Garment,belt,sleeve or other covering ,elastic or similar stretch (Z6109) Garment,belt,sleeve or other covering ,elastic or similar stretch (U0454)  Problem # 2:  HALLUX RIGIDUS, ACQUIRED (ICD-735.2) I think this affects her gait and the stress to ant shins as does arch collapse  Problem # 3:  UNEQUAL LEG LENGTH (ICD-736.81) partially corrected by scoliosis and would not make much change at this point  Complete  Medication List: 1)  Prilosec 40 Mg Cpdr (Omeprazole) .Marland Kitchen.. 1 by mouth once daily 2)  Protonix 40 Mg Tbec (Pantoprazole sodium) .Marland Kitchen.. 1 once daily to two times a day 3)  Voltaren 1 % Gel (Diclofenac sodium) .... Use qid to great toe on left 4)  Tramadol Hcl 50 Mg Tabs (Tramadol hcl) .Marland Kitchen.. 1-2 by mouth q 6 hrs as needed severe headache

## 2010-06-30 ENCOUNTER — Encounter: Payer: Self-pay | Admitting: *Deleted

## 2010-09-08 LAB — HEMOGLOBIN AND HEMATOCRIT, BLOOD: Hemoglobin: 13.9 g/dL (ref 12.0–15.0)

## 2010-10-11 NOTE — Op Note (Signed)
Shannon Fields, Shannon Fields                 ACCOUNT NO.:  [id number]   MEDICAL RECORD NO.:  [id number]          PATIENT TYPE:  AMB   LOCATION:  DAY                          FACILITY:  [facility]   PHYSICIAN:  Alfonse Ras, MD   DATE OF BIRTH:  05/25/1961   DATE OF PROCEDURE:  DATE OF DISCHARGE:                               OPERATIVE REPORT   PREOPERATIVE DIAGNOSES:  Left-sided incisional hernia and left inguinal  hernia.   POSTOPERATIVE DIAGNOSES:  Left-sided incisional hernia and left inguinal  hernia.   PROCEDURE:  Repair of left inguinal hernia and left incisional hernia  repair with  6 x 6 piece of UltraPro mesh and primary repair.   SURGEON:  Alfonse Ras, MD   ANESTHESIA:  General.   DESCRIPTION:  After extensive informed consent was granted by the  patient for incisional and left inguinal hernia repair, she was taken to  the operating room, placed in supine position, and underwent general  anesthesia by endotracheal tube.  The abdomen and groin were prepped and  draped in normal sterile fashion.  Because of the proximity of the  incisional hernia and the inguinal hernia, I opted to use an oblique  incision in the left lower quadrant.  I dissected down onto the external  oblique fascia and this was opened along its fibers.  The round ligament  was identified at the pubic tubercle.  Indirect hernia defect and sac  were reduced away from the round ligament.  The round ligament was  ligated and transected.  The internal hernia sac was returned to the  abdominal cavity through the internal ring.  The area of incisional  hernia which was out near the left iliac crest was also identified.  Primary repair was accomplished by approximating the transversalis  fascia to the inguinal ligament with interrupted #1 Surgilon sutures x  15.  A piece of UltraPro mesh was placed over both hernia repairs and  tacked using running 2-0 Prolene suture to the pubic tubercle along the  inguinal  ligament over the lateral hernia and along the transversalis  fascia.  Adequate hemostasis was ensured.  The abdominal wall was quite  lax.  The external oblique fascia was closed with a running 3-0 Vicryl  suture.  All tissues were injected with 0.5 Marcaine.  Skin was closed  with staples.  Sterile dressing was applied.  The patient tolerated the  procedure well and went to PACU in good condition.      Alfonse Ras, MD  Electronically Signed     KRE/MEDQ  D:  08/04/2008  T:  08/04/2008  Job:  [id number]

## 2010-10-14 NOTE — Op Note (Signed)
[place]  Patient:    Shannon Fields, Shannon Fields Visit Number: [id number] MRN: [id number]          Service Type: DSU Location: 5000 [id number] [id number] Attending Physician:  Vikki Ports. Dictated by:   Teena Irani. Odis Luster, M.D. Proc. Date: 12/02/01 Admit Date:  12/02/2001                             Operative Report  PREOPERATIVE DIAGNOSIS:  Excess abdominal skin and fat.  POSTOPERATIVE DIAGNOSIS:  Excess abdominal skin and fat.  OPERATION PERFORMED:  Abdominoplasty.  SURGEON:  Teena Irani. Odis Luster, M.D.  ANESTHESIA:  General.  ESTIMATED BLOOD LOSS:  Minimal.  DRAINS:  Two Blakes were left.  INDICATIONS FOR PROCEDURE:  This 50 year old woman has an epigastric hernia which is recurrent and is having it repaired by her general surgeon, Dr. Luan Pulling.  She would like abdominoplasty.  The nature of this procedure and the risks and possible complications were discussed with her in detail.  She wanted to have this done as a combined procedure and this was discussed with Dr. Luan Pulling who is in agreement.  She understood the nature of the procedure and the risks and possible complications and wished to proceed.  DESCRIPTION OF PROCEDURE:  The patient was marked in a full upright position preoperatively.   She was taken to the operating room and placed supine. After successful induction of general anesthesia, she was prepped with Betadine and draped with sterile drapes.  The incision was made around the umbilicus and the umbilicus isolated from the surrounding tissue taking great care to avoid any injury into the epigastric hernia which was located just above the umbilicus.  The lower incision of the planned abdominal incisions was then made as it had been marked preoperatively.  Dissection was carried down through the subcutaneous tissues using electrocautery and the underlying fascia was identified.  The dissection was then performed in a cephalad direction  taking great care to avoid damage to the underlying fascia and intra-abdominal contents.  Once the dissection reached the level of the umbilicus, great care was taken to dissect with Metzenbaum scissors to the area of the umbilicus in order to avoid any possibility of damage to intestine or contents of the hernia sac.  This dissection having been completed, the hernia was identified.  The dissection then continued out to the costal margin and the xiphoid.  Meticulous hemostasis with electrocautery.  The rectus plication was performed cephalad using 0 Prolene interrupted figure-of-eight sutures.  Great care again being taken to avoid damage to underlying intra-abdominal contents.  At this point the procedure was turned over to Dr. Luan Pulling for the ventral hernia repair.  The ventral hernia repair having been completed, the rectus plication was complete inferiorly using 0 Prolene interrupted figure-of-eight sutures.  Once small area laterally on the right side that looked a little bit weak was reinforced with a single 0 Prolene figure-of-eight suture.  Again great care taken to avoid any damage to underlying intra-abdominal contents.  Thorough irrigation with saline and excellent hemostasis achieved using electrocautery. The patient was then placed in the semi-Fowlers position and the amputation of the excess skin and abdominal fat was performed as it had been marked preoperatively.  This permitted closure without tension.  The Blake drains were positioned and brought out through separate stab wounds inferiorly and secured with 3-0 Prolene sutures.  Again thorough irrigation with saline. Excellent hemostasis having been achieved  with electrocautery.  Closure with 2-0 Vicryl interrupted inverted deep sutures and a running 4-0 Monocryl subcuticular suture.  A vertical incision was then made at the midline as it had been marked preoperatively right over the umbilicus brought through  this opening and inset with 3-0 Vicryl interrupted inverted deep sutures.  The umbilicus had excellent color consistent with viability as did the entire length of the lower abdominal flap.  Steri-Strips, dry sterile dressing were applied, and she was transported to the recovery room in stable condition having tolerated the procedure well. Dictated by:   Teena Irani. Odis Luster, M.D. Attending Physician:  Danna Hefty R. DD:  12/02/01 TD:  12/03/01 Job: [id number]

## 2010-10-14 NOTE — Op Note (Signed)
[place]. [facility]  Patient:    Shannon Fields, Shannon Fields                          MRN: [id number] Proc. Date: 12/31/00 Adm. Date:  [id number] Attending:  Sonda Primes                           Operative Report  PREOPERATIVE DIAGNOSIS:  Symptomatic epigastric hernia.  POSTOPERATIVE DIAGNOSIS:  Symptomatic epigastric hernia.  OPERATION PERFORMED:  Repair of epigastric hernia.  SURGEON:  Mardene Celeste. Lurene Shadow, M.D.  ASSISTANT:  Nurse.  ANESTHESIA:  General.  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  She has subsequently developed recurrent pain superior to the umbilicus in the epigastric area and on evaluation she had a small epigastric hernia which was not felt at her previous examination.  She was brought to the operating room now for repair of the epigastric hernia.  DESCRIPTION OF PROCEDURE:  Following induction of satisfactory general anesthetic with the patient positioned supinely, the abdomen was prepped and draped routinely.  The area over the epigastric hernia was previously marked and a vertical incision was made over this region, deepened through the skin and subcutaneous tissue carrying the dissection down to the hernia sac.  The sac was dissected free all the way down to the midline fascia.  The fascia was somewhat attenuated and the attenuated fascia was trimmed away.  The region of the round ligament which had herniated into the defect was then doubly clamped and secured with ties of 2-0 silk.  The abdominal musculature was sufficiently lax so that a primary closure could be made without difficulty.  I closed this with interrupted sutures of 0 and Novofil, placing transverse, inverted mattress fashion.  The repair was checked and noted to be intact.  The subcutaneous tissues were then closed with interrupted 2-0 Vicryl  sutures and the skin closed with running 5-0 Monocryl which was then reinforced with Steri-Strips.  Sterile compressive dressing was applied.  Anesthetic reversed.  Patient removed from the operating room to the recovery room in stable condition having tolerated the procedure well. DD:  12/31/00 TD:  12/31/00 Job: [id number]

## 2010-10-14 NOTE — Op Note (Signed)
[place]. [facility]  Patient:    Shannon Fields, Shannon Fields Visit Number: [id number] MRN: [id number]          Service Type: DSU Location: 5000 5030 01 Attending Physician:  Vikki Ports. Dictated by:   Teena Irani. Odis Luster, M.D. Proc. Date: 12/02/01 Admit Date:  12/02/2001                             Operative Report  PREOPERATIVE DIAGNOSIS:  Possible hematoma, abdomen.  POSTOPERATIVE DIAGNOSIS:  Hematoma, abdomen.  OPERATION PERFORMED:  Re-exploration of abdominal wound and drainage of hematoma.  SURGEON:  Teena Irani. Odis Luster, M.D.  ASSISTANT:  Alfredia Ferguson, M.D.  ANESTHESIA:  General.  ESTIMATED BLOOD LOSS:  Minimal.  OPERATIVE FINDINGS:  300 cc of clotted blood, no active bleeding.  INDICATIONS FOR PROCEDURE:  The patient is a 50 year old woman who had abdominoplasty with repair of an epigastric hernia yesterday.  She did well throughout the day but last night began to develop some bleeding from her drains.  The abdomen remained soft.  It was difficult to tell whether it was a hematoma but the fairly significant output of blood of about 300 cc with a drop in her hemoglobin down to 10 raised a question that she very likely had some sort of bleeding and exploration was recommended.  She was in agreement and wished to proceed.  DESCRIPTION OF PROCEDURE:  The patient was taken to the operating room and placed in a slight semi-Fowlers position due to the abdominal closure for abdominoplasty yesterday.  After satisfactory general anesthesia, she was prepped with Betadine and draped with sterile drapes.  The incision was opened.  There was approximately 300 cc of clotted blood all along the lower aspect of the incision.  There was thorough irrigation with saline and careful exploration was carried forth.  The cautery was used to cauterize anything that looked the least bit suspicious but in fact, there was not any active bleeding visualized.  Having  achieved excellent hemostasis, the drains were positioned.  Blake drains, 2 mm flat three quarters fluted brought out through stab wounds inferiorly and secured with 3-0 plain sutures.  The abdominal closure with 2-0 Vicryl interrupted inverted deep sutures and running 4-0 Monocryl subcuticular suture.  A couple of 3-0 Vicryl interrupted inverted deep sutures were placed as needed.  Steri-Strips applied.  Dry sterile dressing and she was at time placed in an abdominal binder to place a little bit of pressure on the abdomen without compromising the skin, which appeared very healthy throughout this procedure.  The patient tolerated the procedure well and was transported to the recovery room in stable condition. Dictated by:   Teena Irani. Odis Luster, M.D. Attending Physician:  Danna Hefty R. DD:  12/03/01 TD:  12/03/01 Job: [id number]

## 2010-10-14 NOTE — H&P (Signed)
[facility]  Patient:    Shannon Fields, Shannon Fields                          MRN: [id number] Adm. Date:  [id number] Attending:  Leonard Schwartz Dictator:   Mack Guise, C.N.M.                         History and Physical  HISTORY OF PRESENT ILLNESS:   Ms. Crimi is a 50 year old, gravida 2, para 1-0-0-1, who presents at 39-4/7 weeks, EDD July 07, 2000, with increasing labor symptoms.  She reports positive fetal movement, no bleeding, no rupture of membranes.  Denies any headaches, visual changes, or epigastric pain. Her pregnancy has been followed by the M.D. service at [facility] and Gynecology and is remarkable for:  1) Advanced maternal age, normal CVS/XX.  2) Previous cesarean section and wants VBAC.  3) Rh negative. 4) History of fibroids.  5) Group B Strep negative.  This patient was initially evaluated at the office of [facility] and Gynecology on November 15, 1999, at approximately [redacted] weeks gestation.  EDD confirmed by early pregnancy ultrasonography at 7 weeks consistent with dates.  Ultrasound throughout pregnancy has been normal and consistent.  Her pregnancy has been essentially unremarkable complicated by advanced maternal age for which she had a normal CVS showing XX chromosomes early in pregnancy.  PRENATAL LABORATORY DATA:     Hemoglobin and hematocrit 13.5 and 40.3, platelets 317,000.  Blood type and rh AB negative, antibody screen negative, toxoplasmosis titer negative, VDRL nonreactive, rubella immune, hepatitis B surface antigen negative, HIV nonreactive.  Pap smear benign reactive changes, no significant atypia.  GC and Chlamydia negative.  AFP normal.  One-hour Glucose Challenge increased, three-hour GTT negative.  At 36 weeks culture of the vaginal tract is negative for Group B Strep.  PAST OBSTETRIC HISTORY:       1998 primary low transverse cesarean section of a 9 pound female infant named Ava  for failure to progress with no complications.  Present pregnancy.  PAST MEDICAL HISTORY:         History of fibroids.  PAST SURGICAL HISTORY:        Cesarean section in 1998.  FAMILY HISTORY:               The patients mother has a history of hypertension.  Brother has epilepsy.  The patients mother died of lung cancer. Paternal grandmother died of lung cancer.  GENETIC HISTORY:              The patient is age 50, otherwise there is no family history of genetic or chromosomal disorders, children that died in infancy, or that were born with birth defects.  SOCIAL HISTORY:               Ms. Yeaman is a 50 year old married Caucasian female.  Her husband, Kaylanni Ezelle, is involved and supportive.  They are of the [place] faith.  ALLERGIES:                    No known drug allergies.  She denies the use of tobacco, alcohol, or illicit drugs.  REVIEW OF SYSTEMS:            There are no signs or symptoms suggestive of focal or systemic disease and the patient is typical of one with a uterine pregnancy at term in early  active labor.  PHYSICAL EXAMINATION:  VITAL SIGNS:                  Stable, afebrile.  HEENT:                        Unremarkable.  Thyroid is not enlarged.  LUNGS:                        Clear.  HEART:                        Regular rate and rhythm.  ABDOMEN:                      Gravid in its contour.  Uterus fundus is noted to extend 40 cm above the level of the pubic symphysis.  Leopolds maneuver finds the infant to be in the longitudinal lie, cephalic presentation, and the estimated fetal weight is 8 pounds.  PELVIC:                       Digital examination of the cervix by RN in [facility] finds it to be 4 cm dilated, 80% effaced, with cephalic presenting part at -2 station and membranes intact.  The baseline of her fetal heart rate 130 to 140 with average longterm variability, reactivity is present, but no periodic changes.  It is reactive and reassuring.  The  patient is contracting every three to four minutes.  EXTREMITIES:                  Within normal limits with no pathologic edema. Reflexes are 1+ with no clonus.  ASSESSMENT:                   Intrauterine pregnancy at term, early active labor, previous cesarean section, wants vaginal birth after cesarean section.  PLAN:                         Admit per Janine Limbo, M.D.  Routine M.D. orders.  The patient may have epidural.  Dr. Stefano Gaul to follow. DD:  07/05/00 TD:  07/05/00 Job: [id number]

## 2010-10-14 NOTE — Op Note (Signed)
[place]. [facility]  Patient:    GERARD, BONUS Visit Number: [id number] MRN: [id number]          Service Type: MED Location: 5000 5030 [id number] Attending Physician:  Vikki Ports. Dictated by:   Earna Coder, M.D. Proc. Date: 12/02/01 Admit Date:  12/02/2001   CC:         Teena Irani. Odis Luster, M.D.   Operative Report  PREOPERATIVE DIAGNOSIS:  Recurrent ventral hernia.  POSTOPERATIVE DIAGNOSIS:  Recurrent ventral hernia.  OPERATION PERFORMED:  Repair of ventral hernia with mesh.  SURGEON:  Stephenie Acres, M.D.  ANESTHESIA:  General.  DESCRIPTION OF PROCEDURE:  The patient was taken to the operating room and after Dr. Odis Luster had made his incision and created a superior flap for the abdominoplasty, I was called to the room.  The patient had about a 5 or 6 cm defect supraumbilical which hernia contents were reduced.  Fascial edges were identified.  They were reapproximated using interrupted #1 Novofil.  A piece of 4 x 6 inch ____________ polyester mesh was then placed over the repair, split to accommodate the umbilicus and tacked in the periphery using a running 2-0 Prolene suture.  I then departed the room and Dr. Odis Luster completed his abdominoplasty. Dictated by:   Earna Coder, M.D. Attending Physician:  Danna Hefty R. DD:  12/02/01 TD:  12/04/01 Job: [id number]

## 2010-10-14 NOTE — Discharge Summary (Signed)
[facility]  Patient:    GRANT, HENKES                          MRN: [id number] Adm. Date:  [id number] Disc. Date: 07/08/00 Attending:  Leonard Schwartz Dictator:   Vance Gather Duplantis, C.N.M.                           Discharge Summary  ADMISSION DIAGNOSES:          1. Intrauterine pregnancy at term.                               2. Early active labor.                               3. Previous cesarean section, desiring a                                  a trial of labor.                               4. Multiparity.                               5. Desires bilateral tubal ligation for                                  sterilization.  DISCHARGE DIAGNOSES:          1. Intrauterine pregnancy at term.                               2. Early active labor.                               3. Previous cesarean section, desiring a                                  a trial of labor.                               4. Multiparity.                               5. Desires bilateral tubal ligation for                                  sterilization.                               6. Failure to descend in labor.  7. Status post repeat cesarean section.                               8. Occult uterine incisional separation note on                                  repeat cesarean section.                               9. Fibroids.                              10. Meconium stained fluid.                              11. Breast-feeding.  PROCEDURES THIS ADMISSION:    Repeat low transverse cesarean section for                               delivery of a viable female infant named Rose                               who weighed 9 pounds 9 ounces and had                               Apgars of 8 and 9 by Dr. Dierdre Forth on                               July 05, 2000. The patient also underwent                               a bilateral  tubal ligation during this                               cesarean section.  HOSPITAL COURSE:              Ms. Joy is a 50 year old, married, white female, gravida 2, para 1-0-0-1 at 39-4/7 weeks, who presented with spontaneous onset of labor and was 4 cm, 80%, vertex, and -2 on admission. She progressed slowly in labor to complete and pushing and pushed for approximately two hours without effecting any fetal descent beyond +2 station and was noted that the baby was in the OP presentation. In light of her history of a previous macrosomic infant who failed to descend, and this lack of descent with two hours of adequate pushing, she was recommended to proceed with cesarean delivery and she agreed. She underwent the same for delivery of a viable female infant named Rose who weighed 9 pounds 9 ounces and had Apgars of 8 and 9, and was delivered by Dr. Dierdre Forth on July 05, 2000. She also underwent bilateral tubal ligation during the time of her C-section at her request.  Her postoperative course has been uneventful. She is ambulating, eating, and voiding without difficulty. Her vital signs are stable  and she is afebrile. She is breast-feeding, also without difficulty. She has had some trouble with hemorrhoids but had good relief with ProctoFoam. She is deemed ready for discharge today, July 08, 2000.  DISCHARGE INSTRUCTIONS:       Per the [facility] handout.  DISCHARGE MEDICATIONS:        1. Motrin 600 mg p.o. q.6h. p.r.n. for pain.                               2. Tylox one to two p.o. q.4-6h. p.r.n. for                                  pain.                               3. Prenatal vitamins daily.                               4. ProctoFoam-HC to use as directed.  DISCHARGE LABORATORIES:       Hemoglobin is 11.0, her WBC count is 19.4 and platelets are 286,000.  DISCHARGE FOLLOWUP:           Tuesday, July 10, 2000 for staple removal at [facility] and in about six weeks for her routine postoperative visit. DD:  07/08/00 TD:  07/08/00 Job: [id number]

## 2010-10-14 NOTE — Discharge Summary (Signed)
[place]. [facility]  Patient:    Shannon Fields, Shannon Fields Visit Number: [id number] MRN: [id number]          Service Type: SUR Location: 5000 5030 01 Attending Physician:  Chapman Moss Dictated by:   Teena Irani. Odis Luster, M.D. Admit Date:  12/02/2001 Discharge Date: 12/06/2001                             Discharge Summary  FINAL DIAGNOSES: 1. Ventral hernia. 2. Excess abdominal skin and fat. 3. Postoperative bleed.  PROCEDURE PERFORMED: 1. Ventral herniorrhaphy and an abdominoplasty on December 02, 2001. 2. Exploration of abdominal wound and drainage of hematoma on December 03, 2001.  SUMMARY OF HISTORY AND PHYSICAL:  A 50 year old woman who had an umbilical hernia followed by an epigastric hernia and then a recurrence of her epigastric hernia.  This would be the third repair for hernia.  In addition, she desired abdominoplasty.  This was discussed with her and it was planned to do as a combined procedure with Dr. Lovie Chol, the general surgeon for the epigastric herniorrhaphy.  The nature of this procedure and the risks were discussed with patient in detail and she understood all this and wished to proceed.  PROCEDURE:  On the day of admission the patient was taken to surgery at which time the ventral herniorrhaphy and the abdominoplasty were performed.  She tolerated this procedure well.  Postoperatively she did well initially. Subsequently, she did develop some blood from her abdominal drains and on evaluation it was felt that she might indeed have a small hematoma in the abdominoplasty wound.  Her hemoglobin had been 14.7 preoperatively and had dropped to 9.9 postoperatively at that time the first postoperative day.  She was taken to surgery at which time exploration did reveal a small hematoma. This was thoroughly rinsed and there was not any bleeding site identified at that time.  The abdominal incision reclosed and she tolerated that procedure well.   Postoperatively her hemoglobin was 8.4, drifted to 7.8 the following day, and now 8.4 today.  Certainly, there is not any evidence of bleeding. Her abdomen has remained stable, soft, and abdominal skin looks very healthy without any evidence of any compromise.  She has been in an abdominal binder. The drains have drained the appropriate amount and have serosanguineous drainage and decreased.  It is felt that she is ready to be discharged tomorrow morning.  She is now tolerating a regular diet.  She is ambulatory. Her vital signs remain stable.  She has not exhibited any type of symptoms related to her anemia.  Her pulse is in the 86-91 range and blood pressure has remained around 110/68, 130/84, and has been very similar to preoperatively.  DISPOSITION:  She will be discharged on a regular diet.  No lifting.  No exercising.  No showering yet.  Wear abdominal binder as much as possible. Empty drains t.i.d. and record the amount.  MEDICATIONS: 1. Percocet one p.o. q.3h. or two p.o. q.6h. p.r.n. pain. 2. Keflex 250 mg p.o. q.i.d. 3. Iron b.i.d. 4. Colace stool softener b.i.d.  No aspirin or anti-inflammatory drugs yet.  We will see her back in the office next week for recheck. Dictated by:   Teena Irani. Odis Luster, M.D. Attending Physician:  Chapman Moss DD:  12/05/01 TD:  12/09/01 Job: [id number]

## 2010-10-14 NOTE — Op Note (Signed)
[place]. [facility]  Patient:    Shannon Fields, Shannon Fields                          MRN: [id number] Proc. Date: 11/13/00 Attending:  Luisa Hart L. Lurene Shadow, M.D.                           Operative Report  PREOPERATIVE DIAGNOSIS:  Umbilical hernia, symptomatic.  POSTOPERATIVE DIAGNOSIS:  Umbilical hernia, symptomatic.  PROCEDURE:  Repair of umbilical hernia.  SURGEON:  Mardene Celeste. Lurene Shadow, M.D.  ASSISTANT:  Nurse.  ANESTHESIA:  General.  NOTE:  This patient is a 50 year old woman presenting with a symptomatic umbilical hernia which intermittently incarcerates, causing pain.  She had recently delivered twins and on lifting the children, the hernia tends to incarcerate and causing severe pain.  She was brought to the operating room now for repair.  DESCRIPTION OF PROCEDURE:  Following the induction of satisfactory general anesthesia, the patient is positioned supinely and the abdomen routinely prepped and draped to be included in the sterile operative field.  I made an infraumbilical incision, deepened this through the skin and subcutaneous tissues, and raised the umbilicus as a flap, carrying the flap superiorly. The hernial sac was then dissected free from the underlying umbilical skin, carrying the dissection down to the fascia.  The sac was then opened and then entirely amputated.  The tissues came together quite easily.  There was no tension on the repair, so we decided to do a primary repair using interrupted, inverted mattress sutures of 0 Novofil placed transversely across the defect. The repair was noted to be intact.  The sponge, instrument, and sharp counts verified.  The umbilical skin was then sutured back down to the fascia with 3-0 Vicryl sutures, and the skin was closed with a running suture of 5-0 Monocryl.  In the middle of the umbilicus, there was a small raised nevus, which the patient had previously requested that I remove, and I grasped this with  an Adson forceps and excised around the nevus, leaving adequate margins around the nevus, and this was removed and forwarded for pathologic evaluation.  Pressure was held on the wound.  The wounds were then reinforced with Steri-Strips, a sterile compressive dressing applied, anesthetic reversed, and the patient removed from the operating room to the recovery room in stable condition.  She tolerated the procedure well. DD:  11/13/00 TD:  11/13/00 Job: [id number]

## 2010-10-14 NOTE — Op Note (Signed)
[facility]  Patient:    Shannon Fields, Shannon Fields                          MRN: [id number] Proc. Date: 07/05/00 Adm. Date:  [id number] Attending:  Leonard Schwartz                           Operative Report  PREOPERATIVE DIAGNOSES:       1. Intrauterine pregnancy at term.                               2. Prior cesarean section.                               3. Desire for trial of labor.                               4. Uterine fibroids.                               5. Failure to descend in labor.                               6. Meconium-stained amniotic fluid.                               7. Desire for surgical sterilization.  POSTOPERATIVE DIAGNOSES:      1. Intrauterine pregnancy at term.                               2. Prior cesarean section.                               3. Desire for trial of labor.                               4. Uterine fibroids.                               5. Failure to descend in labor.                               6. Meconium-stained amniotic fluid.                               7. Occult uterine incisional separation.                               8. Desire for surgical sterilization.  SURGEON:                      Vanessa P. Pennie Rushing, M.D.  FIRST ASSISTANT:              Feliberto Harts  Wilson, C.N.M.  SURGERY:                      Repeat low transverse cesarean section and tubal                               sterilization.  ANESTHESIA:                   Epidural.  ESTIMATED BLOOD LOSS:         1000 cc.  COMPLICATIONS:                None.  FINDINGS:                     The uterus contained a 10 cm uterine fibroid. There were several adhesions from the previous cesarean section.  There was a separation of the uterine incision underneath the visceral peritoneal bladder flap.  The ovaries were normal bilaterally.  The right fallopian tube was normal.  The left fallopian tube was edematous in the mesosalpingeal area. The  patient was delivered of a female infant whose name is Rose, weighing 9 pounds 9 ounces with Apgars of 8 and 9 at one and five minutes respectively.  DESCRIPTION OF PROCEDURE:     The patient was taken to the operating room after appropriate identification and placed on the operating table.  Her labor epidural and Foley catheter were in place.  She was placed on the operating table.  The labor epidural was dosed for surgical anesthesia.  The abdomen was prepped with multiple layers of Betadine and draped as a sterile field.  A transverse incision was made in the abdomen, and the abdomen was opened in layers.  The peritoneum was entered, and the skin incision had to be extended on the right side in order to visualize the cephalad placed visceral peritoneal reflection.  The visceral peritoneum reflection was incised, and there was egress of meconium-stained fluid because of separation of the uterine incision beneath the visceral peritoneal reflection.  This incision was then widened and the infant delivered from the occiput transverse position and after having the nares and pharynx suctioned and the cord clamped and cut, was handed off to the awaiting pediatricians.  Before delivery of the shoulders and body, the infant was duly suctioned at the incision.  The appropriate cord blood was drawn and the placenta noted to spontaneously separate then remove from the uterine cavity.  The uterine incision was then closed with a running interlocking suture of 0 Vicryl.  Hemostasis was noted to be adequate.  The visceral peritoneum was repaired with a running suture of 2-0 Vicryl.  Copious irrigation was carried out.  The left fallopian tube was identified, followed to its fimbriated end, then grasped at the isthmic portion and elevated.  Because of the significant edema surrounding the tube, the 2-0 chromic suture was used to tie distally through the mesosalpinx, and then another suture ligature  placed proximal with an intervening 3 cm of tube. The tube was excised with Bovie cautery and the tube cut ends cauterized.  The intervening mesosalpinx was oversewn with a running interlocking suture of 2-0 chromic and hemostasis noted to be adequate.  The fallopian tube on the right side was then identified, followed to its fimbriated end, then grasped at the isthmic portion and elevated.  A suture of 2-0 chromic was placed through the mesosalpinx and tied  fore and aft on a knuckle of tube.  A second suture ligature was placed proximal to that and the intervening knuckle of tube excised.  The cut ends were cauterized.  Hemostasis was noted to be adequate. Copious irrigation was carried out and the abdominal peritoneum closed with a running suture of 2-0 Vicryl.  The rectus muscles were reapproximated in the midline with figure-of-eight sutures of 2-0 Vicryl.  The rectus muscles were noted to be hemostatic and the rectus fascia closed with a running suture of 0 Vicryl then reinforced on either side of midline with sutures of 0 Vicryl. The subcutaneous tissue was copiously irrigated and made hemostatic with Bovie cautery.  Skin staples were applied to the skin incision.  A sterile dressing was applied.  The patient was taken from the operating room to the recovery room in satisfactory condition, having tolerated the procedure well with sponge and instrument counts correct.  The infant went to the full-term nursery. DD:  07/05/00 TD:  07/07/00 Job: [id number]

## 2010-11-15 ENCOUNTER — Other Ambulatory Visit: Payer: Self-pay | Admitting: Family Medicine

## 2010-11-15 DIAGNOSIS — Z1231 Encounter for screening mammogram for malignant neoplasm of breast: Secondary | ICD-10-CM

## 2010-12-20 ENCOUNTER — Ambulatory Visit: Payer: Self-pay

## 2010-12-21 ENCOUNTER — Ambulatory Visit (INDEPENDENT_AMBULATORY_CARE_PROVIDER_SITE_OTHER): Payer: 59 | Admitting: *Deleted

## 2010-12-21 VITALS — Temp 98.1°F

## 2010-12-21 DIAGNOSIS — Z23 Encounter for immunization: Secondary | ICD-10-CM

## 2010-12-21 MED ORDER — TETANUS-DIPHTH-ACELL PERTUSSIS 5-2.5-18.5 LF-MCG/0.5 IM SUSP: 0.5000 mL | Freq: Once | INTRAMUSCULAR | Status: DC

## 2010-12-21 NOTE — Progress Notes (Signed)
In to update  Tetanus vaccine because she had a puncture wound  to hand that has healed . Injury was 12/17/2010

## 2010-12-22 ENCOUNTER — Ambulatory Visit
Admission: RE | Admit: 2010-12-22 | Discharge: 2010-12-22 | Disposition: A | Discharge status: Home / Self Care | Payer: 59 | Source: Ambulatory Visit | Attending: Family Medicine | Admitting: Family Medicine

## 2010-12-22 DIAGNOSIS — Z1231 Encounter for screening mammogram for malignant neoplasm of breast: Secondary | ICD-10-CM

## 2011-03-09 ENCOUNTER — Other Ambulatory Visit: Payer: Self-pay | Admitting: Family Medicine

## 2011-03-09 DIAGNOSIS — R030 Elevated blood-pressure reading, without diagnosis of hypertension: Secondary | ICD-10-CM

## 2011-03-09 DIAGNOSIS — Z13220 Encounter for screening for lipoid disorders: Secondary | ICD-10-CM

## 2011-07-20 ENCOUNTER — Ambulatory Visit (INDEPENDENT_AMBULATORY_CARE_PROVIDER_SITE_OTHER): Payer: 59 | Admitting: Family Medicine

## 2011-07-20 DIAGNOSIS — J329 Chronic sinusitis, unspecified: Secondary | ICD-10-CM | POA: Insufficient documentation

## 2011-07-20 MED ORDER — AMOXICILLIN 500 MG PO CAPS: 500.0000 mg | ORAL_CAPSULE | Freq: Two times a day (BID) | ORAL | Status: AC

## 2011-07-20 NOTE — Assessment & Plan Note (Signed)
Primary symptoms of rhinorrhea > 2 weeks.  Will terat empirically with amoxicillin x 7 days for sinusitis.  Discussed use of oral decongestant and afrin to help with nasal congestion and Eustachian tube dysfunction to help mediate risks of flying. Given red flags to return

## 2011-07-20 NOTE — Progress Notes (Signed)
  Subjective:    Patient ID: Olive Bass, female    DOB: 1960-12-18, 51 y.o.   MRN: [id number]  HPI Here for workin appt to evaluate nasal congestion and cough x 2 weeks.  No improvement over this time. Has had intermittent sore throat, ear ringing, nasal congestion which is the most bothersome symptoms.  No fever, has not been taking any OTC meds.  Concerned due to duration and that she has to fly to a funeral in the next 1-2 days.  No ear congestion.  No tooth pain, facial pain, or second sickening.   Review of SystemsGeneral:  Negative for fever, chills, malaise, myalgias HEENT: Negative for conjunctivitis, ear pain or drainage, sore throat Respiratory:  Negative for  sputum, dyspnea Abdomen: Negative for abdominal pain, emesis, diarrhea Skin:  Negative for rash         Objective:   Physical Exam  GEN: Alert & Oriented, No acute distress HEENT: [place]/AT. EOMI, PERRLA, no conjunctival injection or scleral icterus.  Bilateral tympanic membranes intact without erythema or effusion, localized ?scab on left TM.  Marland Kitchen  Nares with edema or rhinorrhea.  Oropharynx is without erythema or exudates.  No anterior or posterior cervical lymphadenopathy. CV:  Regular Rate & Rhythm, no murmur Respiratory:  Normal work of breathing, CTAB       Assessment & Plan:

## 2011-07-20 NOTE — Patient Instructions (Addendum)
Amoxicillin for sinus infection  May try oral decongestant like sudafed (available behind the counter) for congestion.  May also use afrin before flight.  Limit use of afrin to 2-3 days to prevent rebound congestion.  Goal blood pressure is less than 140/90  Best wishes for this weekend

## 2012-02-13 ENCOUNTER — Encounter: Payer: Self-pay | Admitting: Family Medicine

## 2012-03-25 ENCOUNTER — Encounter: Payer: Self-pay | Admitting: Family Medicine

## 2012-03-25 ENCOUNTER — Ambulatory Visit (INDEPENDENT_AMBULATORY_CARE_PROVIDER_SITE_OTHER): Payer: 59 | Admitting: Family Medicine

## 2012-03-25 VITALS — BP 167/104 | HR 81 | Temp 98.4°F | Ht 62.0 in | Wt 151.0 lb

## 2012-03-25 DIAGNOSIS — Z13220 Encounter for screening for lipoid disorders: Secondary | ICD-10-CM

## 2012-03-25 DIAGNOSIS — R030 Elevated blood-pressure reading, without diagnosis of hypertension: Secondary | ICD-10-CM | POA: Insufficient documentation

## 2012-03-25 DIAGNOSIS — I10 Essential (primary) hypertension: Secondary | ICD-10-CM | POA: Insufficient documentation

## 2012-03-25 NOTE — Patient Instructions (Addendum)
Monitor your blood pressure Send me the readings  Come in for your blood work  Listen to yourself

## 2012-03-25 NOTE — Assessment & Plan Note (Signed)
Will check labs and monitor at home   

## 2012-03-25 NOTE — Progress Notes (Signed)
  Subjective:    Patient ID: Shannon Fields, female    DOB: [birth year], 51 y.o.   MRN: [id number]  HPI  Blood pressure Has been elevated on and off at home.  Is taking ibuprofen regularly (daily) for headaches.  Trying to watch salt intake.  No much time for exercise.  No chest pain or shortness of breath or edema.  Neck Pain Headaches For years worse with stress.  Takes ibuprofen which helps.  Has a massage monthly or so which helps  Tramadol did not.   No focal weakness or visual changes.     Patient Information Form: Screening and ROS  AUDIT-C Score: 0 Do you feel safe in relationships? yes PHQ-2:negative  Review of Symptoms  General:  Negative for nexplained weight loss, fever Skin: Negative for new or changing mole, sore that won't heal HEENT: Negative for trouble hearing, trouble seeing, ringing in ears, mouth sores, hoarseness, change in voice, dysphagia. CV:  Negative for chest pain, dyspnea, edema, palpitations Resp: Negative for cough, dyspnea, hemoptysis GI: Negative for nausea, vomiting, diarrhea, constipation, abdominal pain, melena, hematochezia. GU: Negative for dysuria, incontinence, urinary hesitance, hematuria, vaginal or penile discharge, polyuria, sexual difficulty, lumps in testicle or breasts MSK: Negative for muscle cramps or aches, joint pain or swelling Neuro: Negative for headaches, weakness, numbness, dizziness, passing out/fainting Psych: Negative for depression, anxiety, memory problems    Review of Systems     Objective:   Physical Exam  Alert no acute distress Neck:  No deformities, thyromegaly, masses, or tenderness noted.   Supple with full range of motion without pain.Does have tense trapezius muscle bilaterally  Heart - Regular rate and rhythm.  No murmurs, gallops or rubs.    Lungs:  Normal respiratory effort, chest expands symmetrically. Lungs are clear to auscultation, no crackles or wheezes. Extremities:  No cyanosis, edema, or deformity  noted with good range of motion of all major joints.          Assessment & Plan:

## 2012-04-12 ENCOUNTER — Other Ambulatory Visit: Payer: Self-pay | Admitting: Dermatology

## 2012-05-07 ENCOUNTER — Other Ambulatory Visit: Payer: Self-pay | Admitting: Gastroenterology

## 2012-05-07 ENCOUNTER — Ambulatory Visit
Admission: RE | Admit: 2012-05-07 | Discharge: 2012-05-07 | Disposition: A | Discharge status: Home / Self Care | Payer: 59 | Source: Ambulatory Visit | Attending: Gastroenterology | Admitting: Gastroenterology

## 2012-05-07 DIAGNOSIS — R109 Unspecified abdominal pain: Secondary | ICD-10-CM

## 2012-05-07 LAB — HM COLONOSCOPY

## 2012-05-07 IMAGING — CR DG ABDOMEN 2V
2 series · 2 of 2 positions shown · non-contrast
Comparison: CT abdomen of [DATE]

CLINICAL DATA: Severe abdominal pain after colonoscopy today, polyp
removed

ABDOMEN - 2 VIEW

[w abdomen upright *]
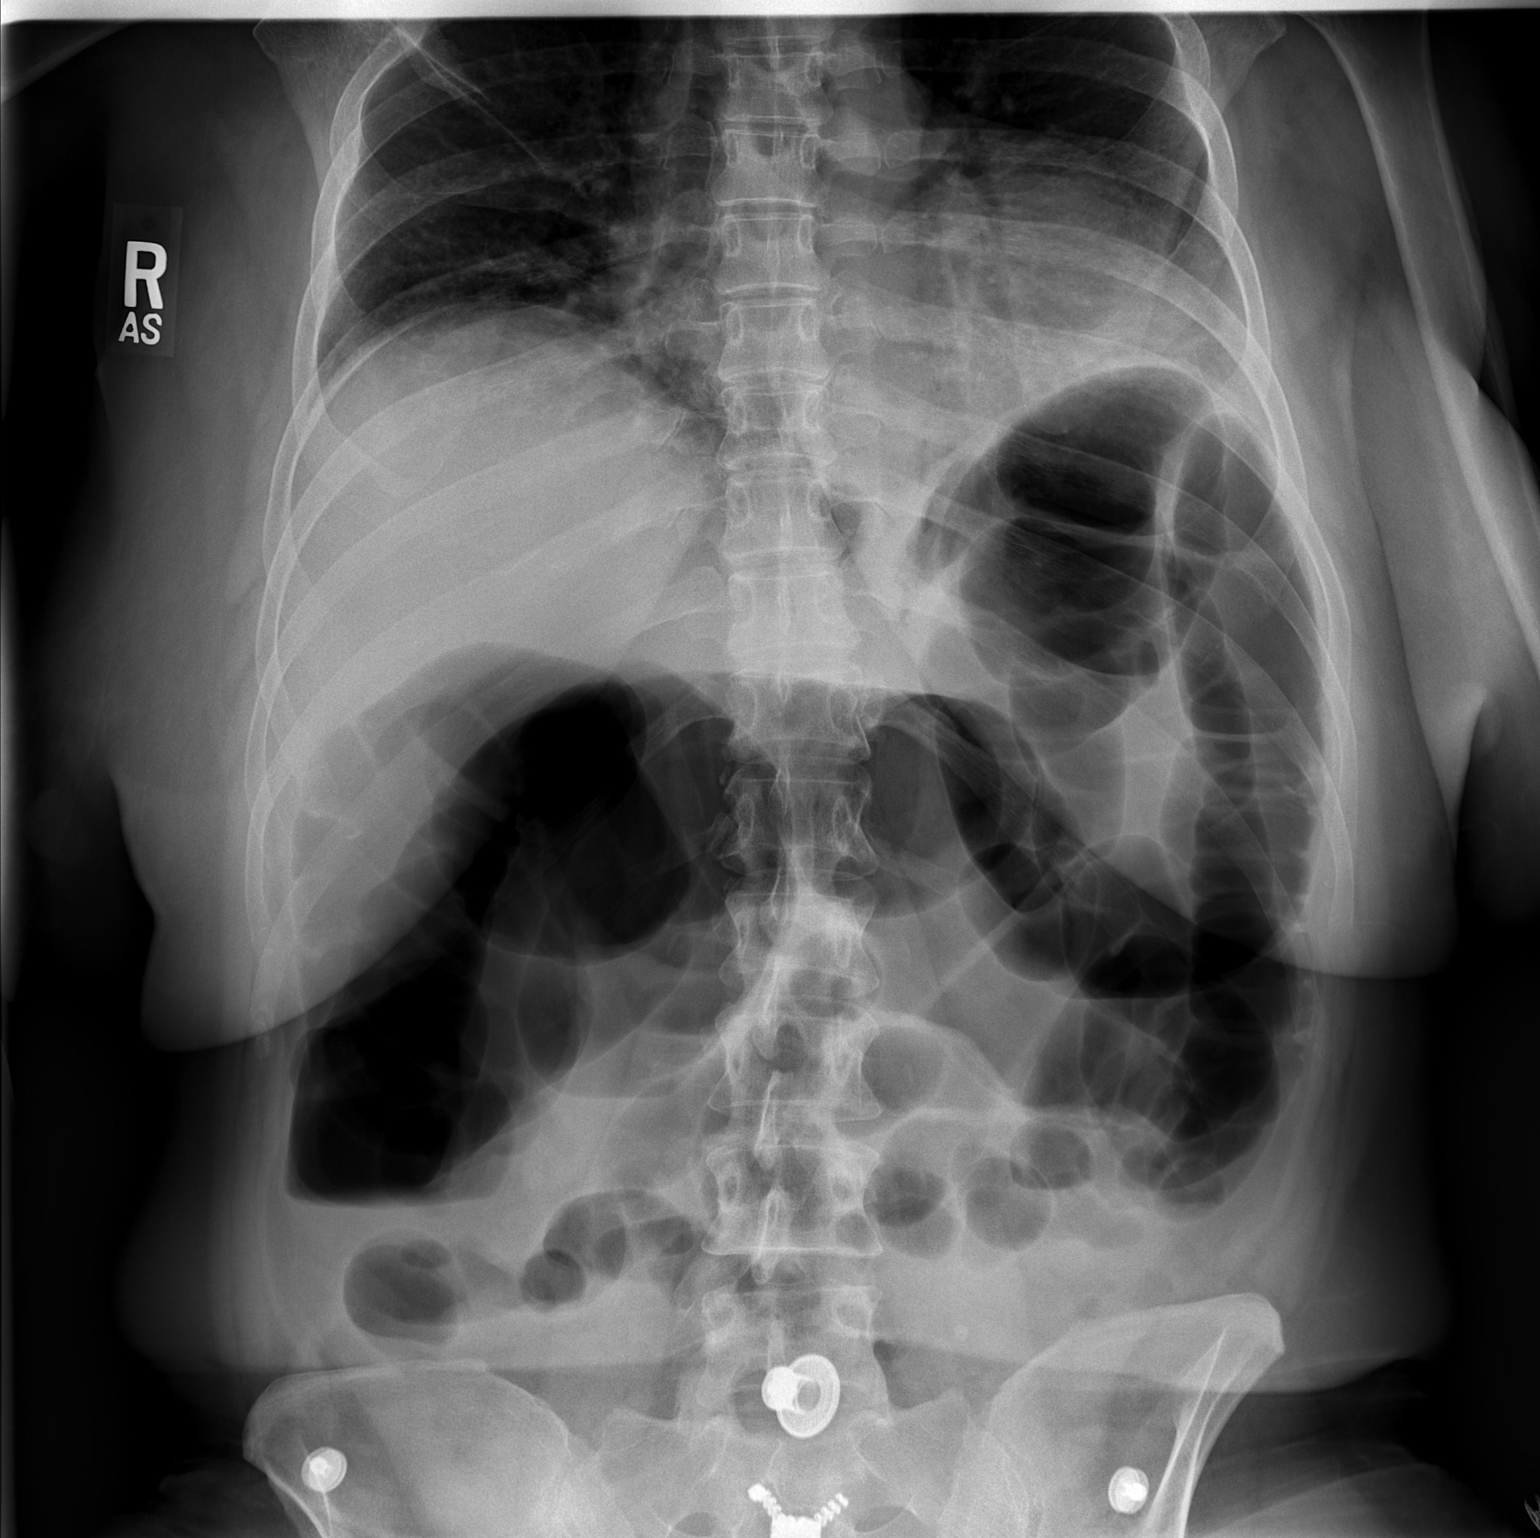

[t abdomen supine]
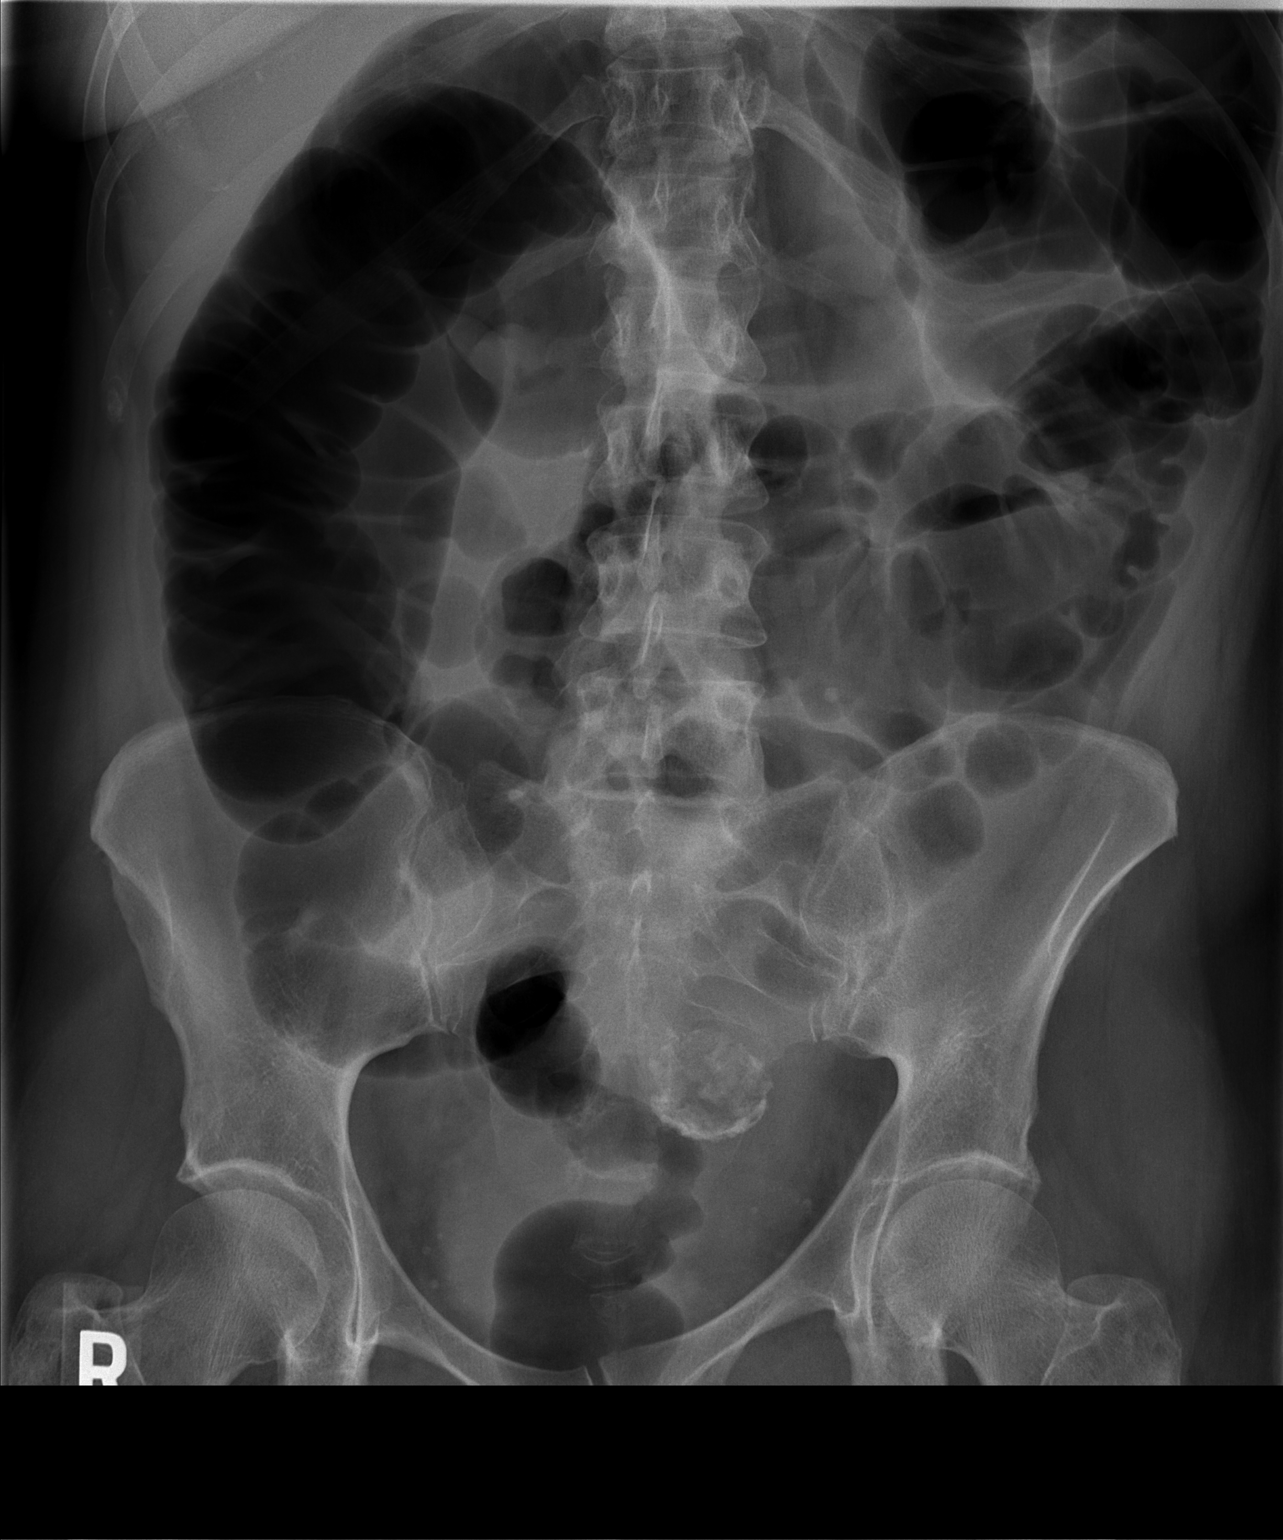

[2 of 2 positions shown; findings below may reference images not displayed]

FINDINGS: There is a large amount of gas primarily throughout the
colon from today's colonoscopy.  However, no free intraperitoneal
air is seen.  Some small bowel gas is present as well.  A rounded
calcification in the left pelvis is consistent with a calcified
uterine fibroid.
IMPRESSION: Large amount of bowel gas secondary to today's colonoscopy.  No
free air.

## 2012-05-17 ENCOUNTER — Other Ambulatory Visit: Payer: 59

## 2012-05-17 DIAGNOSIS — IMO0001 Reserved for inherently not codable concepts without codable children: Secondary | ICD-10-CM

## 2012-05-17 DIAGNOSIS — Z13220 Encounter for screening for lipoid disorders: Secondary | ICD-10-CM

## 2012-05-17 LAB — CBC
HCT: 41.2 % (ref 36.0–46.0)
Hemoglobin: 14.1 g/dL (ref 12.0–15.0)
MCHC: 34.2 g/dL (ref 30.0–36.0)
RBC: 4.61 MIL/uL (ref 3.87–5.11)

## 2012-05-17 LAB — COMPREHENSIVE METABOLIC PANEL
Albumin: 4.2 g/dL (ref 3.5–5.2)
Alkaline Phosphatase: 42 U/L (ref 39–117)
BUN: 16 mg/dL (ref 6–23)
CO2: 25 mEq/L (ref 19–32)
Calcium: 9.3 mg/dL (ref 8.4–10.5)
Glucose, Bld: 106 mg/dL — ABNORMAL HIGH (ref 70–99)
Potassium: 4.8 mEq/L (ref 3.5–5.3)
Sodium: 139 mEq/L (ref 135–145)
Total Protein: 6.7 g/dL (ref 6.0–8.3)

## 2012-05-17 LAB — LIPID PANEL
HDL: 77 mg/dL (ref >39)
LDL Cholesterol: 77 mg/dL (ref 0–99)
Total CHOL/HDL Ratio: 2.2 Ratio

## 2012-05-17 NOTE — Progress Notes (Signed)
CMP,FLP AND CBC DONE TODAY Shannon Fields 

## 2012-05-24 ENCOUNTER — Encounter: Payer: Self-pay | Admitting: Family Medicine

## 2012-06-18 NOTE — H&P (Signed)
Shannon Fields  DICTATION # [id number] CSN# [id number]   Meriel Pica, MD 06/18/2012 2:08 PM

## 2012-06-19 ENCOUNTER — Encounter (HOSPITAL_COMMUNITY): Payer: Self-pay | Admitting: Pharmacist

## 2012-06-20 NOTE — H&P (Signed)
Shannon Fields, Shannon Fields                 ACCOUNT NO.:  [id number]  MEDICAL RECORD NO.:  [id number]  LOCATION:                                 FACILITY:  PHYSICIAN:  Duke Salvia. Marcelle Overlie, M.D.DATE OF BIRTH:  01/13/1961  DATE OF ADMISSION: DATE OF DISCHARGE:                             HISTORY & PHYSICAL   CHIEF COMPLAINT:  Menorrhagia, probable submucous fibroid.  HISTORY OF PRESENT ILLNESS:  A 52 year old, G2, P2, prior tubal.  This is a patient of Dr. Jackelyn Knife, who has evaluated her with sonohysterogram for heavy bleeding, which showed a 2 cm probable soft tissue polyp in the cavity with several small intramural fibroids.  She presents now for D and C, hysteroscopy.  We discussed other treatment options in particular LAVH, which she declines.  Specific risks related to this procedure include bleeding, infection, other complications that may require open or additional surgery, which she understands and accepts.  PAST MEDICAL HISTORY:  none.  CURRENT MEDICATIONS:  None.  ALLERGIES:  None.  REVIEW OF SYSTEMS:  Significant for history of headache.  FAMILY HISTORY:  Significant for lung cancer.  SOCIAL HISTORY:  Denies tobacco or drug use.  She does drink 2 alcoholic drinks per week.  She is married.  PHYSICAL EXAMINATION:  VITAL SIGNS:  Temp 92, blood pressure 130/72. HEENT:  Unremarkable. NECK:  Supple without masses. LUNGS:  Clear. CARDIOVASCULAR:  Regular rhythm without murmurs, rubs, or gallops. BREASTS:  Without masses. ABDOMEN:  Soft, flat, nontender. PELVIC:  Vulva, vagina, and cervix normal.  Pap is normal.  Recently, uterus upper limit normal size, mobile.  Adnexa negative. EXTREMITIES:  Unremarkable. NEUROLOGIC:  Unremarkable  IMPRESSION:  Menorrhagia, probable endometrial polyp, leiomyoma.  PLAN:  D and C, hysteroscopy.  Procedure and risks discussed as above.     Demetre Monaco M. Marcelle Overlie, M.D.     RM[facility]/MEDQ  D:  06/18/2012  T:  06/19/2012  Job:  [id number]

## 2012-06-27 ENCOUNTER — Encounter (HOSPITAL_COMMUNITY): Payer: Self-pay | Admitting: Anesthesiology

## 2012-06-27 ENCOUNTER — Ambulatory Visit (HOSPITAL_COMMUNITY)
Admission: RE | Admit: 2012-06-27 | Discharge: 2012-06-27 | Disposition: A | Discharge status: Home / Self Care | Payer: 59 | Source: Ambulatory Visit | Attending: Obstetrics and Gynecology | Admitting: Obstetrics and Gynecology

## 2012-06-27 ENCOUNTER — Encounter (HOSPITAL_COMMUNITY)
Admission: RE | Disposition: A | Discharge status: Home / Self Care | Payer: Self-pay | Source: Ambulatory Visit | Attending: Obstetrics and Gynecology

## 2012-06-27 DIAGNOSIS — Z5309 Procedure and treatment not carried out because of other contraindication: Secondary | ICD-10-CM | POA: Insufficient documentation

## 2012-06-27 DIAGNOSIS — D250 Submucous leiomyoma of uterus: Secondary | ICD-10-CM | POA: Insufficient documentation

## 2012-06-27 DIAGNOSIS — N920 Excessive and frequent menstruation with regular cycle: Secondary | ICD-10-CM | POA: Insufficient documentation

## 2012-06-27 DIAGNOSIS — D251 Intramural leiomyoma of uterus: Secondary | ICD-10-CM | POA: Insufficient documentation

## 2012-06-27 LAB — CBC
HCT: 41.3 % (ref 36.0–46.0)
Hemoglobin: 14.2 g/dL (ref 12.0–15.0)
MCH: 30.1 pg (ref 26.0–34.0)
MCHC: 34.4 g/dL (ref 30.0–36.0)

## 2012-06-27 SURGERY — DILATATION & CURETTAGE/HYSTEROSCOPY WITH TRUCLEAR
Anesthesia: Choice

## 2012-06-27 MED ORDER — MIDAZOLAM HCL 2 MG/2ML IJ SOLN
INTRAMUSCULAR | Status: AC
  Filled 2012-06-27: qty 2

## 2012-06-27 MED ORDER — ONDANSETRON HCL 4 MG/2ML IJ SOLN
INTRAMUSCULAR | Status: AC
  Filled 2012-06-27: qty 2

## 2012-06-27 MED ORDER — FENTANYL CITRATE 0.05 MG/ML IJ SOLN
INTRAMUSCULAR | Status: AC
  Filled 2012-06-27: qty 5

## 2012-06-27 MED ORDER — LACTATED RINGERS IV SOLN: INTRAVENOUS | Status: DC

## 2012-06-27 MED ORDER — ETOMIDATE 2 MG/ML IV SOLN
INTRAVENOUS | Status: AC
  Filled 2012-06-27: qty 10

## 2012-06-27 SURGICAL SUPPLY — 21 items
BLADE INCISOR TRUC PLUS 2.9 (ABLATOR) IMPLANT
CANISTERS HI-FLOW 3000CC (CANNISTER) IMPLANT
CATH ROBINSON RED A/P 16FR (CATHETERS) ×2 IMPLANT
CLOTH BEACON ORANGE TIMEOUT ST (SAFETY) ×2 IMPLANT
CONTAINER PREFILL 10% NBF 60ML (FORM) ×4 IMPLANT
DRAPE HYSTEROSCOPY (DRAPE) ×2 IMPLANT
DRESSING TELFA 8X3 (GAUZE/BANDAGES/DRESSINGS) ×2 IMPLANT
ELECT REM PT RETURN 9FT ADLT (ELECTROSURGICAL)
ELECTRODE REM PT RTRN 9FT ADLT (ELECTROSURGICAL) IMPLANT
GLOVE BIO SURGEON STRL SZ7 (GLOVE) ×4 IMPLANT
GOWN STRL REIN XL XLG (GOWN DISPOSABLE) ×4 IMPLANT
INCISOR TRUC PLUS BLADE 2.9 (ABLATOR)
KIT HYSTEROSCOPY TRUCLEAR (ABLATOR) IMPLANT
MORCELLATOR RECIP TRUCLEAR 4.0 (ABLATOR) IMPLANT
NDL SPNL 22GX3.5 QUINCKE BK (NEEDLE) ×1 IMPLANT
NEEDLE SPNL 22GX3.5 QUINCKE BK (NEEDLE) ×2 IMPLANT
PACK VAGINAL MINOR WOMEN LF (CUSTOM PROCEDURE TRAY) ×2 IMPLANT
PAD OB MATERNITY 4.3X12.25 (PERSONAL CARE ITEMS) ×2 IMPLANT
SYR CONTROL 10ML LL (SYRINGE) ×2 IMPLANT
TOWEL OR 17X24 6PK STRL BLUE (TOWEL DISPOSABLE) ×4 IMPLANT
WATER STERILE IRR 1000ML POUR (IV SOLUTION) ×2 IMPLANT

## 2012-06-27 NOTE — Progress Notes (Signed)
Patient reported drinking 8 oz of Gatorade at 1045.  Dr. Arby Barrette said he would postpone her case until 1500 if Dr. Marcelle Overlie was agreeable. Dr. Marcelle Overlie advised he was unable to do the surgery at that time and to tell the patient to call his office to reschedule.  Patient verbalizes understanding.

## 2012-06-28 ENCOUNTER — Encounter (HOSPITAL_COMMUNITY): Payer: Self-pay | Admitting: *Deleted

## 2012-07-10 ENCOUNTER — Encounter (HOSPITAL_COMMUNITY)
Admission: RE | Disposition: A | Discharge status: Home / Self Care | Payer: Self-pay | Source: Ambulatory Visit | Attending: Obstetrics and Gynecology

## 2012-07-10 ENCOUNTER — Ambulatory Visit (HOSPITAL_COMMUNITY): Payer: 59 | Admitting: Anesthesiology

## 2012-07-10 ENCOUNTER — Ambulatory Visit (HOSPITAL_COMMUNITY)
Admission: RE | Admit: 2012-07-10 | Discharge: 2012-07-10 | Disposition: A | Discharge status: Home / Self Care | Payer: 59 | Source: Ambulatory Visit | Attending: Obstetrics and Gynecology | Admitting: Obstetrics and Gynecology

## 2012-07-10 ENCOUNTER — Encounter (HOSPITAL_COMMUNITY): Payer: Self-pay | Admitting: Anesthesiology

## 2012-07-10 ENCOUNTER — Encounter (HOSPITAL_COMMUNITY): Payer: Self-pay | Admitting: *Deleted

## 2012-07-10 DIAGNOSIS — N840 Polyp of corpus uteri: Secondary | ICD-10-CM

## 2012-07-10 DIAGNOSIS — N939 Abnormal uterine and vaginal bleeding, unspecified: Secondary | ICD-10-CM

## 2012-07-10 DIAGNOSIS — N949 Unspecified condition associated with female genital organs and menstrual cycle: Secondary | ICD-10-CM | POA: Insufficient documentation

## 2012-07-10 DIAGNOSIS — N938 Other specified abnormal uterine and vaginal bleeding: Secondary | ICD-10-CM | POA: Insufficient documentation

## 2012-07-10 HISTORY — DX: Polyp of corpus uteri: N84.0

## 2012-07-10 LAB — PREGNANCY, URINE: Preg Test, Ur: NEGATIVE

## 2012-07-10 SURGERY — DILATATION & CURETTAGE/HYSTEROSCOPY WITH TRUCLEAR
Anesthesia: General

## 2012-07-10 MED ORDER — KETOROLAC TROMETHAMINE 30 MG/ML IJ SOLN: 15.0000 mg | Freq: Once | INTRAMUSCULAR | Status: DC | PRN

## 2012-07-10 MED ORDER — DEXAMETHASONE SODIUM PHOSPHATE 10 MG/ML IJ SOLN
INTRAMUSCULAR | Status: DC | PRN
  Administered 2012-07-10: 10 mg via INTRAVENOUS

## 2012-07-10 MED ORDER — ONDANSETRON HCL 4 MG/2ML IJ SOLN
INTRAMUSCULAR | Status: AC
  Filled 2012-07-10: qty 2

## 2012-07-10 MED ORDER — ETOMIDATE 2 MG/ML IV SOLN
INTRAVENOUS | Status: DC | PRN
  Administered 2012-07-10: 14 mg via INTRAVENOUS

## 2012-07-10 MED ORDER — LIDOCAINE HCL (CARDIAC) 20 MG/ML IV SOLN
INTRAVENOUS | Status: AC
  Filled 2012-07-10: qty 5

## 2012-07-10 MED ORDER — SODIUM CHLORIDE 0.9 % IR SOLN
Status: DC | PRN
  Administered 2012-07-10 (×2): 3000 mL

## 2012-07-10 MED ORDER — KETOROLAC TROMETHAMINE 30 MG/ML IJ SOLN
INTRAMUSCULAR | Status: AC
  Filled 2012-07-10: qty 1

## 2012-07-10 MED ORDER — LACTATED RINGERS IV SOLN
INTRAVENOUS | Status: DC
  Administered 2012-07-10 (×2): via INTRAVENOUS

## 2012-07-10 MED ORDER — ETOMIDATE 2 MG/ML IV SOLN
INTRAVENOUS | Status: AC
  Filled 2012-07-10: qty 10

## 2012-07-10 MED ORDER — KETOROLAC TROMETHAMINE 30 MG/ML IJ SOLN
INTRAMUSCULAR | Status: DC | PRN
  Administered 2012-07-10: 30 mg via INTRAVENOUS

## 2012-07-10 MED ORDER — DEXAMETHASONE SODIUM PHOSPHATE 10 MG/ML IJ SOLN
INTRAMUSCULAR | Status: AC
  Filled 2012-07-10: qty 1

## 2012-07-10 MED ORDER — HYDROCODONE-IBUPROFEN 7.5-200 MG PO TABS: 1.0000 | ORAL_TABLET | Freq: Three times a day (TID) | ORAL | Status: DC | PRN

## 2012-07-10 MED ORDER — MIDAZOLAM HCL 5 MG/5ML IJ SOLN
INTRAMUSCULAR | Status: DC | PRN
  Administered 2012-07-10: 2 mg via INTRAVENOUS

## 2012-07-10 MED ORDER — LIDOCAINE HCL 1 % IJ SOLN
INTRAMUSCULAR | Status: DC | PRN
  Administered 2012-07-10: 10 mL

## 2012-07-10 MED ORDER — LIDOCAINE HCL (CARDIAC) 20 MG/ML IV SOLN
INTRAVENOUS | Status: DC | PRN
  Administered 2012-07-10: 50 mg via INTRAVENOUS

## 2012-07-10 MED ORDER — FENTANYL CITRATE 0.05 MG/ML IJ SOLN
INTRAMUSCULAR | Status: AC
Start: 2012-07-10 — End: 2012-07-10
  Filled 2012-07-10: qty 2

## 2012-07-10 MED ORDER — ONDANSETRON HCL 4 MG/2ML IJ SOLN
INTRAMUSCULAR | Status: DC | PRN
  Administered 2012-07-10: 4 mg via INTRAVENOUS

## 2012-07-10 MED ORDER — MIDAZOLAM HCL 2 MG/2ML IJ SOLN
INTRAMUSCULAR | Status: AC
  Filled 2012-07-10: qty 2

## 2012-07-10 MED ORDER — FENTANYL CITRATE 0.05 MG/ML IJ SOLN
INTRAMUSCULAR | Status: DC | PRN
  Administered 2012-07-10: 50 ug via INTRAVENOUS
  Administered 2012-07-10: 100 ug via INTRAVENOUS
  Administered 2012-07-10: 50 ug via INTRAVENOUS

## 2012-07-10 MED ORDER — FENTANYL CITRATE 0.05 MG/ML IJ SOLN: 25.0000 ug | INTRAMUSCULAR | Status: DC | PRN

## 2012-07-10 MED ORDER — FENTANYL CITRATE 0.05 MG/ML IJ SOLN
INTRAMUSCULAR | Status: AC
  Filled 2012-07-10: qty 2

## 2012-07-10 SURGICAL SUPPLY — 21 items
BLADE INCISOR TRUC PLUS 2.9 (ABLATOR) IMPLANT
CANISTERS HI-FLOW 3000CC (CANNISTER) IMPLANT
CATH ROBINSON RED A/P 16FR (CATHETERS) ×2 IMPLANT
CLOTH BEACON ORANGE TIMEOUT ST (SAFETY) ×2 IMPLANT
CONTAINER PREFILL 10% NBF 60ML (FORM) ×4 IMPLANT
DRAPE HYSTEROSCOPY (DRAPE) ×2 IMPLANT
DRESSING TELFA 8X3 (GAUZE/BANDAGES/DRESSINGS) ×2 IMPLANT
ELECT REM PT RETURN 9FT ADLT (ELECTROSURGICAL)
ELECTRODE REM PT RTRN 9FT ADLT (ELECTROSURGICAL) IMPLANT
GLOVE BIO SURGEON STRL SZ7 (GLOVE) ×4 IMPLANT
GOWN STRL REIN XL XLG (GOWN DISPOSABLE) ×4 IMPLANT
INCISOR TRUC PLUS BLADE 2.9 (ABLATOR) ×2
KIT HYSTEROSCOPY TRUCLEAR (ABLATOR) ×2 IMPLANT
MORCELLATOR RECIP TRUCLEAR 4.0 (ABLATOR) IMPLANT
NDL SPNL 22GX3.5 QUINCKE BK (NEEDLE) ×1 IMPLANT
NEEDLE SPNL 22GX3.5 QUINCKE BK (NEEDLE) ×2 IMPLANT
PACK VAGINAL MINOR WOMEN LF (CUSTOM PROCEDURE TRAY) ×2 IMPLANT
PAD OB MATERNITY 4.3X12.25 (PERSONAL CARE ITEMS) ×2 IMPLANT
SYR CONTROL 10ML LL (SYRINGE) ×2 IMPLANT
TOWEL OR 17X24 6PK STRL BLUE (TOWEL DISPOSABLE) ×4 IMPLANT
WATER STERILE IRR 1000ML POUR (IV SOLUTION) ×2 IMPLANT

## 2012-07-10 NOTE — Transfer of Care (Signed)
Immediate Anesthesia Transfer of Care Note  Patient: Shannon Fields  Procedure(s) Performed: Procedure(s): DILATATION & CURETTAGE/HYSTEROSCOPY WITH TRUCLEAR (N/A)  Patient Location: PACU  Anesthesia Type:General  Level of Consciousness: awake  Airway & Oxygen Therapy: Patient Spontanous Breathing and Patient connected to nasal cannula oxygen  Post-op Assessment: Report given to PACU RN and Post -op Vital signs reviewed and stable  Post vital signs: stable  Complications: No apparent anesthesia complications

## 2012-07-10 NOTE — Progress Notes (Signed)
The patient was re-examined with no change in status 

## 2012-07-10 NOTE — Op Note (Signed)
Preoperative diagnosis: Abnormal uterine bleeding, endometrial polyp  Postoperative diagnosis: Same  Procedure: Hysteroscopy with true clear resection of endometrial polyp  Surgeon: Marcelle Overlie  Specimens removed: Endometrial polyp fragments, to pathology  EBL: Less than 50 cc  Complications: None  Procedure and findings:  The patient taken the operating room after an adequate level of general anesthesia was obtained with the patient's legs in stirrups the perineum and vagina were prepped and draped in the usual fashion for vaginal procedures. The bladder was drained, appropriate timeout taken at that point. He UA carried out uterus is midposition normal size adnexa negative. Weighted speculum was positioned cervix grasped with a tenaculum, paracervical block was then created by infiltrating at 3 and 9:00 submucosally 5-7 cc 1% Xylocaine at 3 and 9:00 after negative aspiration. The uterus is then sounded to 9 cm progressively dilated to a 20-32 Pratt dilator. The smaller true clear hysteroscope was then inserted and a well-defined endometrial polyp coming from the posterior wall was noted. The smaller morcellator was positioned and under direct visualization this was morcellated out completely. Repeat hysteroscopic exam revealed the cavity was clean, fragments sent to pathology. She tolerated this well went to recovery room in good condition.  Dictated with [facility]  Lem Peary M. Milana Obey.D.

## 2012-07-10 NOTE — Anesthesia Preprocedure Evaluation (Addendum)
Anesthesia Evaluation  Patient identified by MRN, date of birth, ID band Patient awake    Reviewed: Allergy & Precautions, H&P , NPO status , Patient's Chart, lab work & pertinent test results, reviewed documented beta blocker date and time   History of Anesthesia Complications Negative for: history of anesthetic complications  Airway Mallampati: I TM Distance: >3 FB Neck ROM: full    Dental  (+) Teeth Intact   Pulmonary neg pulmonary ROS,  breath sounds clear to auscultation  Pulmonary exam normal       Cardiovascular Exercise Tolerance: Good negative cardio ROS  Rhythm:regular Rate:Normal     Neuro/Psych negative neurological ROS  negative psych ROS   GI/Hepatic negative GI ROS, Neg liver ROS,   Endo/Other  negative endocrine ROS  Renal/GU negative Renal ROS  Female GU complaint     Musculoskeletal   Abdominal   Peds  Hematology negative hematology ROS (+)   Anesthesia Other Findings   Reproductive/Obstetrics negative OB ROS                           Anesthesia Physical Anesthesia Plan  ASA: I  Anesthesia Plan: General LMA   Post-op Pain Management:    Induction:   Airway Management Planned:   Additional Equipment:   Intra-op Plan:   Post-operative Plan:   Informed Consent: I have reviewed the patients History and Physical, chart, labs and discussed the procedure including the risks, benefits and alternatives for the proposed anesthesia with the patient or authorized representative who has indicated his/her understanding and acceptance.   Dental Advisory Given  Plan Discussed with: CRNA and Surgeon  Anesthesia Plan Comments:         Anesthesia Quick Evaluation  

## 2012-07-11 NOTE — Anesthesia Postprocedure Evaluation (Addendum)
  Anesthesia Post-op Note  Anesthesia Post Note  Patient: Shannon Fields  Procedure(s) Performed: Procedure(s) (LRB): DILATATION & CURETTAGE/HYSTEROSCOPY WITH TRUCLEAR (N/A)  Anesthesia type: General  Patient location: PACU  Post pain: Pain level controlled  Post assessment: Post-op Vital signs reviewed  Last Vitals:  Filed Vitals:   07/10/12 1415  BP: 145/73  Pulse: 70  Temp: 36.6 C  Resp: 20    Post vital signs: Reviewed  Level of consciousness: sedated  Complications: No apparent anesthesia complications

## 2012-10-31 ENCOUNTER — Telehealth: Payer: Self-pay | Admitting: Family Medicine

## 2012-10-31 NOTE — Telephone Encounter (Signed)
Pt dropped off paperwork to be filled out regarding college admissions p.e. Form. Phone number to reach pt at is [phone]

## 2012-11-18 ENCOUNTER — Ambulatory Visit (INDEPENDENT_AMBULATORY_CARE_PROVIDER_SITE_OTHER): Payer: 59 | Admitting: Family Medicine

## 2012-11-18 ENCOUNTER — Encounter: Payer: Self-pay | Admitting: Family Medicine

## 2012-11-18 VITALS — BP 136/90 | HR 69 | Ht 62.0 in | Wt 150.0 lb

## 2012-11-18 DIAGNOSIS — M2170 Unequal limb length (acquired), unspecified site: Secondary | ICD-10-CM

## 2012-11-18 DIAGNOSIS — M79609 Pain in unspecified limb: Secondary | ICD-10-CM

## 2012-11-18 DIAGNOSIS — M79671 Pain in right foot: Secondary | ICD-10-CM

## 2012-11-18 NOTE — Patient Instructions (Addendum)
You have 1st MTP arthritis. Take tylenol 500mg  1-2 tabs three times a day for pain. Consider aleve 1-2 tabs twice a day with food if still painful Glucosamine sulfate 750mg  twice a day is a supplement that may help moderate to severe arthritis. Capsaicin OR voltaren gel topically up to four times a day may also help with pain. Cortisone injections are an option though less effective with these joints. Orthotics with a first ray post would be my greatest recommendation - if you want these, let me know and we'll make some for you - you'd need to wear closed shoes with them though. Heat or ice 15 minutes at a time 3-4 times a day as needed to help with pain. You have a 2 cm leg length difference - if more than 1 cm it's generally recommended to correct this with the 2/3rd length heel lift. Let me know if you want to do any of the above measures.

## 2012-11-19 ENCOUNTER — Encounter: Payer: Self-pay | Admitting: Family Medicine

## 2012-11-19 DIAGNOSIS — M79672 Pain in left foot: Secondary | ICD-10-CM | POA: Insufficient documentation

## 2012-11-19 DIAGNOSIS — M79671 Pain in right foot: Secondary | ICD-10-CM | POA: Insufficient documentation

## 2012-11-19 NOTE — Progress Notes (Signed)
Patient ID: Shannon Fields, female   DOB: Dec 22, 1960, 52 y.o.   MRN: [id number]  PCP: Carney Living, MD  Subjective:   HPI: Patient is a 52 y.o. female here for bilateral foot pain.  Patient reports she's had longstanding issues with left great toe, known DJD here. Has in past few months started noticing similar pain and swelling of right great toe at 1st MTP. In past has used metatarsal pads but states these made her hurt worse. Does not have custom orthotics. In fact does not like wearing closed toe shoes about this time of year - currently wearing sandals. Feels worse with flat shoes. Known leg length inequality of about 1 inch but not using a heel lift. Not taking any medication for pain or arthritis.  History reviewed. No pertinent past medical history.  Current Outpatient Prescriptions on File Prior to Visit  Medication Sig Dispense Refill  . famotidine (PEPCID AC) 10 MG chewable tablet Chew 10 mg by mouth 2 (two) times daily as needed. For heartburn      . HYDROcodone-ibuprofen (VICOPROFEN) 7.5-200 MG per tablet Take 1 tablet by mouth every 8 (eight) hours as needed for pain.  30 tablet  0  . ibuprofen (ADVIL,MOTRIN) 600 MG tablet Take 600 mg by mouth every 8 (eight) hours as needed. For headaches       No current facility-administered medications on file prior to visit.    Past Surgical History  Procedure Laterality Date  . Cesarean section    . Hernia repair    . Abdominoplasty      Allergies  Allergen Reactions  . Eggs Or Egg-Derived Products     History   Social History  . Marital Status: Married    Spouse Name: N/A    Number of Children: N/A  . Years of Education: N/A   Occupational History  . Not on file.   Social History Main Topics  . Smoking status: Never Smoker   . Smokeless tobacco: Not on file  . Alcohol Use: No  . Drug Use: No  . Sexually Active: Not on file   Other Topics Concern  . Not on file   Social History Narrative  . No  narrative on file    Family History  Problem Relation Age of Onset  . Hypertension Mother   . Sudden death Neg Hx   . Diabetes Neg Hx   . Heart attack Neg Hx   . Hyperlipidemia Neg Hx     BP 136/90  Pulse 69  Ht 5\' 2"  (1.575 m)  Wt 150 lb (68.04 kg)  BMI 27.43 kg/m2  Review of Systems: See HPI above.    Objective:  Physical Exam:  Gen: NAD  L foot/ankle: Hallux rigidus without dorsiflexion - bone spur palpable dorsally.  No bruising, other deformities. FROM ankle with 5/5 strength all directions. TTP mildly circumferentially about 1st MTP.  No metatarsal head, other TTP. Negative ant drawer and talar tilt.   Negative syndesmotic compression. Minimal pain with metatarsal squeeze but felt at 1st MTP. Thompsons test negative. NV intact distally.  R foot/ankle: Mild bone spur with hallux rigidus 1st MTP - has 20 degrees dorsiflexion.  No bruising, other deformities. FROM ankle with 5/5 strength all directions. No TTP currently throughout foot or ankle including 1st MTP, metatarsal heads. Negative ant drawer and talar tilt.   Negative syndesmotic compression. Negative metatarsal squeeze. Thompsons test negative. NV intact distally.  Leg length left 83.5 cm, right 85.5 cm.  Assessment &  Plan:  1. Bilateral 1st MTP DJD, hallux rigidus - Discussed tylenol, aleve, glucosamine, and topicals (voltaren gel, capsaicin) that she could try to help with pain.  At this time pain is not severe enough for her to consider sports insoles with a 1st ray post or custom orthotics with a 1st ray post - prefers sandals, open toed shoes.  Can consider intraarticular cortisone injection as well.  F/u prn.  2. Leg length inequality - 2 cm difference.  No current issues as a result of this discrepancy but advised lift be used on left side.

## 2012-11-19 NOTE — Assessment & Plan Note (Signed)
Bilateral 1st MTP DJD, hallux rigidus - Discussed tylenol, aleve, glucosamine, and topicals (voltaren gel, capsaicin) that she could try to help with pain.  At this time pain is not severe enough for her to consider sports insoles with a 1st ray post or custom orthotics with a 1st ray post - prefers sandals, open toed shoes.  Can consider intraarticular cortisone injection as well.  F/u prn.

## 2012-11-19 NOTE — Assessment & Plan Note (Signed)
2 cm difference.  No current issues as a result of this discrepancy but advised lift be used on left side.

## 2012-11-22 NOTE — Telephone Encounter (Signed)
Filled out and left at front to pick up.  Notified via email

## 2013-05-06 ENCOUNTER — Other Ambulatory Visit: Payer: Self-pay | Admitting: Family Medicine

## 2013-05-06 DIAGNOSIS — Z1231 Encounter for screening mammogram for malignant neoplasm of breast: Secondary | ICD-10-CM

## 2013-05-27 ENCOUNTER — Ambulatory Visit (HOSPITAL_COMMUNITY)
Admission: RE | Admit: 2013-05-27 | Discharge: 2013-05-27 | Disposition: A | Discharge status: Home / Self Care | Payer: 59 | Source: Ambulatory Visit | Attending: Family Medicine | Admitting: Family Medicine

## 2013-05-27 DIAGNOSIS — Z1231 Encounter for screening mammogram for malignant neoplasm of breast: Secondary | ICD-10-CM | POA: Insufficient documentation

## 2013-11-10 ENCOUNTER — Other Ambulatory Visit: Payer: Self-pay | Admitting: Family Medicine

## 2013-11-10 DIAGNOSIS — R209 Unspecified disturbances of skin sensation: Secondary | ICD-10-CM

## 2013-11-10 DIAGNOSIS — R221 Localized swelling, mass and lump, neck: Secondary | ICD-10-CM

## 2013-11-10 DIAGNOSIS — Z13220 Encounter for screening for lipoid disorders: Secondary | ICD-10-CM

## 2013-11-11 ENCOUNTER — Other Ambulatory Visit: Payer: 59

## 2013-11-11 DIAGNOSIS — Z13220 Encounter for screening for lipoid disorders: Secondary | ICD-10-CM

## 2013-11-11 DIAGNOSIS — R209 Unspecified disturbances of skin sensation: Secondary | ICD-10-CM

## 2013-11-11 LAB — LIPID PANEL
CHOL/HDL RATIO: 3 ratio
CHOLESTEROL: 209 mg/dL — AB (ref 0–200)
HDL: 70 mg/dL (ref >39)
LDL Cholesterol: 119 mg/dL — ABNORMAL HIGH (ref 0–99)
TRIGLYCERIDES: 99 mg/dL (ref <150)
VLDL: 20 mg/dL (ref 0–40)

## 2013-11-11 NOTE — Progress Notes (Signed)
CMP AND FLP DONE TODAY MARCI HOLDER 

## 2013-11-12 ENCOUNTER — Encounter: Payer: Self-pay | Admitting: Family Medicine

## 2013-11-12 LAB — TSH: TSH: 2.005 u[IU]/mL (ref 0.350–4.500)

## 2014-01-27 ENCOUNTER — Encounter: Payer: Self-pay | Admitting: Sports Medicine

## 2014-01-27 ENCOUNTER — Ambulatory Visit (INDEPENDENT_AMBULATORY_CARE_PROVIDER_SITE_OTHER): Payer: 59 | Admitting: Sports Medicine

## 2014-01-27 VITALS — BP 147/88 | Ht 62.0 in | Wt 150.0 lb

## 2014-01-27 DIAGNOSIS — M25532 Pain in left wrist: Secondary | ICD-10-CM

## 2014-01-27 DIAGNOSIS — M25539 Pain in unspecified wrist: Secondary | ICD-10-CM
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
## 2014-02-23 ENCOUNTER — Ambulatory Visit: Payer: 59 | Admitting: Sports Medicine

## 2014-04-28 ENCOUNTER — Other Ambulatory Visit: Payer: Self-pay | Admitting: Family Medicine

## 2014-04-28 DIAGNOSIS — Z1231 Encounter for screening mammogram for malignant neoplasm of breast: Secondary | ICD-10-CM

## 2014-04-29 ENCOUNTER — Other Ambulatory Visit: Payer: Self-pay | Admitting: Gynecology

## 2014-04-30 LAB — CYTOLOGY - PAP

## 2014-06-04 ENCOUNTER — Ambulatory Visit (HOSPITAL_COMMUNITY): Payer: 59

## 2014-06-09 ENCOUNTER — Ambulatory Visit (HOSPITAL_COMMUNITY)
Admission: RE | Admit: 2014-06-09 | Discharge: 2014-06-09 | Disposition: A | Discharge status: Home / Self Care | Payer: 59 | Source: Ambulatory Visit | Attending: Family Medicine | Admitting: Family Medicine

## 2014-06-09 DIAGNOSIS — Z1231 Encounter for screening mammogram for malignant neoplasm of breast: Secondary | ICD-10-CM | POA: Insufficient documentation

## 2014-06-11 ENCOUNTER — Encounter (HOSPITAL_COMMUNITY): Payer: Self-pay | Admitting: Obstetrics and Gynecology

## 2014-11-09 ENCOUNTER — Ambulatory Visit (INDEPENDENT_AMBULATORY_CARE_PROVIDER_SITE_OTHER): Payer: 59 | Admitting: *Deleted

## 2014-11-09 DIAGNOSIS — Z111 Encounter for screening for respiratory tuberculosis: Secondary | ICD-10-CM | POA: Diagnosis not present

## 2014-11-09 NOTE — Progress Notes (Signed)
   PPD placed Left Forearm.  Pt to return 11/11/14 for reading.  Pt tolerated intradermal injection. Derl Barrow, RN

## 2014-11-11 ENCOUNTER — Encounter: Payer: 59 | Admitting: Family Medicine

## 2014-11-11 ENCOUNTER — Encounter: Payer: Self-pay | Admitting: Family Medicine

## 2014-11-11 ENCOUNTER — Ambulatory Visit (INDEPENDENT_AMBULATORY_CARE_PROVIDER_SITE_OTHER): Payer: 59 | Admitting: *Deleted

## 2014-11-11 ENCOUNTER — Ambulatory Visit (INDEPENDENT_AMBULATORY_CARE_PROVIDER_SITE_OTHER): Payer: 59 | Admitting: Family Medicine

## 2014-11-11 VITALS — BP 148/92 | HR 70 | Temp 98.1°F | Ht 62.0 in | Wt 155.6 lb

## 2014-11-11 DIAGNOSIS — Z111 Encounter for screening for respiratory tuberculosis: Secondary | ICD-10-CM

## 2014-11-11 DIAGNOSIS — R51 Headache: Secondary | ICD-10-CM

## 2014-11-11 DIAGNOSIS — R519 Headache, unspecified: Secondary | ICD-10-CM

## 2014-11-11 DIAGNOSIS — R030 Elevated blood-pressure reading, without diagnosis of hypertension: Secondary | ICD-10-CM | POA: Diagnosis not present

## 2014-11-11 DIAGNOSIS — Z7689 Persons encountering health services in other specified circumstances: Secondary | ICD-10-CM

## 2014-11-11 DIAGNOSIS — IMO0001 Reserved for inherently not codable concepts without codable children: Secondary | ICD-10-CM

## 2014-11-11 LAB — TB SKIN TEST
Induration: 0 mm
TB Skin Test: NEGATIVE

## 2014-11-11 NOTE — Patient Instructions (Signed)
Good to see you today!  Thanks for coming in.  Exercise and diet and stress reduction  Monitor blood pressure if regularly > 150/90 then come back

## 2014-11-11 NOTE — Progress Notes (Signed)
   PPD Reading Note PPD read and results entered in [employer]. Result: 0 mm induration. Interpretation: Negative If test not read within 48-72 hours of initial placement, patient advised to repeat in other arm 1-3 weeks after this test. Allergic reaction: no  Martin, Tamika L, RN  

## 2014-11-11 NOTE — Progress Notes (Signed)
   Subjective:    Patient ID: Shannon Fields, female    DOB: 1960/07/24, 54 y.o.   MRN: [id number]  HPI  Here for exam  Overall feels well.  Very busy with school and work.  No exercising.    Elevated blood pressure - no medications.  No high risk CV family history.  Not checking blood pressure regularly  Has frequent headaches and is using ibuprofen daily.  Long standing problem   Patient reports no  vision/ hearing changes,anorexia, weight change, fever ,adenopathy, persistant / recurrent hoarseness, swallowing issues, chest pain, edema,persistant / recurrent cough, hemoptysis, dyspnea(rest, exertional, paroxysmal nocturnal), gastrointestinal  bleeding (melena, rectal bleeding), abdominal pain, excessive heart burn, GU symptoms(dysuria, hematuria, pyuria, voiding/incontinence  Issues) syncope, focal weakness, severe memory loss, concerning skin lesions, depression, anxiety, abnormal bruising/bleeding, major joint swelling, breast masses or abnormal vaginal bleeding.     Review of Systems     Objective:   Physical Exam Alert no acute distress Heart - Regular rate and rhythm.  No murmurs, gallops or rubs.    Lungs:  Normal respiratory effort, chest expands symmetrically. Lungs are clear to auscultation, no crackles or wheezes. Abdomen: soft and non-tender without masses, organomegaly or hernias noted.  No guarding or rebound Extremities:  No cyanosis, edema, or deformity noted with good range of motion of all major joints.   Skin:  Intact without suspicious lesions or rashes Blood pressure equal in both arms       Assessment & Plan:    CPE Normal exam

## 2014-11-12 DIAGNOSIS — R519 Headache, unspecified: Secondary | ICD-10-CM | POA: Insufficient documentation

## 2014-11-12 DIAGNOSIS — R51 Headache: Secondary | ICD-10-CM

## 2014-11-12 NOTE — Assessment & Plan Note (Addendum)
Seems to be musculoskeletal.  No neuro symptoms. Possible analgesic overuse with daily ibuprofen.  Discussed approaches.  She will focus on once school demand reduce

## 2014-11-12 NOTE — Assessment & Plan Note (Signed)
Borderline on repeat exam.  No signs of cv disease and no strong fhx.  Will monitor.

## 2015-05-17 ENCOUNTER — Other Ambulatory Visit: Payer: Self-pay

## 2015-05-17 DIAGNOSIS — Z1231 Encounter for screening mammogram for malignant neoplasm of breast: Secondary | ICD-10-CM

## 2015-06-14 ENCOUNTER — Ambulatory Visit
Admission: RE | Admit: 2015-06-14 | Discharge: 2015-06-14 | Disposition: A | Discharge status: Home / Self Care | Payer: 59 | Source: Ambulatory Visit

## 2015-06-14 DIAGNOSIS — Z1231 Encounter for screening mammogram for malignant neoplasm of breast: Secondary | ICD-10-CM | POA: Diagnosis not present

## 2015-06-16 ENCOUNTER — Ambulatory Visit (INDEPENDENT_AMBULATORY_CARE_PROVIDER_SITE_OTHER): Payer: 59 | Admitting: Family Medicine

## 2015-06-16 ENCOUNTER — Encounter: Payer: Self-pay | Admitting: Family Medicine

## 2015-06-16 VITALS — BP 177/99 | HR 70 | Temp 98.0°F | Ht 62.0 in | Wt 164.0 lb

## 2015-06-16 DIAGNOSIS — IMO0001 Reserved for inherently not codable concepts without codable children: Secondary | ICD-10-CM

## 2015-06-16 DIAGNOSIS — R030 Elevated blood-pressure reading, without diagnosis of hypertension: Secondary | ICD-10-CM | POA: Diagnosis not present

## 2015-06-16 DIAGNOSIS — B079 Viral wart, unspecified: Secondary | ICD-10-CM | POA: Diagnosis not present

## 2015-06-16 NOTE — Patient Instructions (Addendum)
Good to see you today!  Thanks for coming in.  Come back in 3 months after school is done! Or sooner if things are getting worse  Choose one realistic achievable change - 5 min on the Nordic track 3 times a week  Use as little ibuprofen and salt as possible  Monitor your blood pressure goal is at least < 150/90  Cryotherapy - Looking after the treatment area  The treated area is likely to blister within a few hours. Sometimes the blister is clear and sometimes it is red or purple because of bleeding (this is harmless). Treatment near the eye may result in a puffy eyelid, especially the following morning, but the swelling settles within a few days. Within a few days a scab forms and the blister gradually dries up.  Usually no special attention is needed during the healing phase. The treated area may be gently washed once or twice daily, and should be kept clean. A dressing is optional, but is advisable if the affected area is subject to trauma or clothes rub on it.  When the blister dries to a scab, apply petroleum jelly (Vaseline) and avoid picking at it. The scab peels off after 5-10 days on the face and 3 weeks on the hand. A sore or scab may persist as long as 3 months on the lower leg because healing in this site is often slow.

## 2015-06-18 NOTE — Progress Notes (Signed)
   Subjective:    Patient ID: Shannon Fields, female    DOB: 1960-12-02, 55 y.o.   MRN: [id number]  HPI  Spot on Forehead Has been there for many months not really changing.  Thinks she had it frozen in past.  No other skin lesions.  No bleeding   Elevated blood pressure Has not been following it at home.  No unusual chest pain or shortness of breath or edema.   Not exercising.  Under a lot of stress with school and work and family.  Take ibuprofen regulary for headache   Chief Complaint noted Review of Symptoms - see HPI PMH - Smoking status noted.   Vital Signs reviewed   Review of Systems     Objective:   Physical Exam  Right forehead Well circumscribed flesh colored papule with rough exterior Measuring 0.4 cm  Procedure Time out taken.  Area frozen thawed frozen Tolerated well        Assessment & Plan:   Nevus vs wart - no signs of cancer.  See how responds to cryotherapy

## 2015-06-18 NOTE — Assessment & Plan Note (Signed)
BP Readings from Last 3 Encounters:  06/16/15 177/99  11/11/14 148/92  01/27/14 147/88   Not at goal.   May respond to stress reduction exercise and stopping NSAID.  She will readdress when finishes school in 3 months

## 2015-07-29 DIAGNOSIS — Z6829 Body mass index (BMI) 29.0-29.9, adult: Secondary | ICD-10-CM | POA: Diagnosis not present

## 2015-07-29 DIAGNOSIS — Z01419 Encounter for gynecological examination (general) (routine) without abnormal findings: Secondary | ICD-10-CM | POA: Diagnosis not present

## 2015-08-19 DIAGNOSIS — D2262 Melanocytic nevi of left upper limb, including shoulder: Secondary | ICD-10-CM | POA: Diagnosis not present

## 2015-08-19 DIAGNOSIS — D2272 Melanocytic nevi of left lower limb, including hip: Secondary | ICD-10-CM | POA: Diagnosis not present

## 2015-08-19 DIAGNOSIS — D2261 Melanocytic nevi of right upper limb, including shoulder: Secondary | ICD-10-CM | POA: Diagnosis not present

## 2015-08-19 DIAGNOSIS — L570 Actinic keratosis: Secondary | ICD-10-CM | POA: Diagnosis not present

## 2015-08-19 DIAGNOSIS — L821 Other seborrheic keratosis: Secondary | ICD-10-CM | POA: Diagnosis not present

## 2015-08-19 DIAGNOSIS — D225 Melanocytic nevi of trunk: Secondary | ICD-10-CM | POA: Diagnosis not present

## 2015-08-19 DIAGNOSIS — D2271 Melanocytic nevi of right lower limb, including hip: Secondary | ICD-10-CM | POA: Diagnosis not present

## 2015-08-19 DIAGNOSIS — D1801 Hemangioma of skin and subcutaneous tissue: Secondary | ICD-10-CM | POA: Diagnosis not present

## 2015-08-19 DIAGNOSIS — L814 Other melanin hyperpigmentation: Secondary | ICD-10-CM | POA: Diagnosis not present

## 2015-09-07 ENCOUNTER — Encounter: Payer: Self-pay | Admitting: Sports Medicine

## 2015-09-07 ENCOUNTER — Ambulatory Visit (INDEPENDENT_AMBULATORY_CARE_PROVIDER_SITE_OTHER): Payer: 59 | Admitting: Sports Medicine

## 2015-09-07 VITALS — BP 118/76 | Ht 62.0 in | Wt 160.0 lb

## 2015-09-07 DIAGNOSIS — M79671 Pain in right foot: Secondary | ICD-10-CM | POA: Diagnosis not present

## 2015-09-07 DIAGNOSIS — M2020 Hallux rigidus, unspecified foot: Secondary | ICD-10-CM | POA: Diagnosis not present

## 2015-09-07 NOTE — Assessment & Plan Note (Signed)
Note this persists left > RT

## 2015-09-07 NOTE — Assessment & Plan Note (Addendum)
Has known bilateral 1st MTP joint degenerative changes with hallux rigidus. No hx of gout, no crystals seen on u/s. No significant erythema or tenderness to suggest acute gout attack.  Her acute pain could be 2/2 to subluxation of the sesamoids on the right foot causing the pain which are not back in place.  - continue ibuprofen, ice, rest. - continue supportive shoes - offered option for surgical referral if patient wishes in the future for her b/l 1st MTP joint degeneration.

## 2015-09-07 NOTE — Progress Notes (Signed)
   Subjective:    Patient ID: Shannon Fields, female    DOB: 30-May-1960, 55 y.o.   MRN: [id number]  HPI  55 yo female with hallux rigidus, scoliosis, unequal leg length, previous hx of bilateral foot pain, here with acute complaints of right foot pain.  She was doing fine last night before going to bed but woke up this morning with right foot pain on the forefoot area on plantar surface. No recent injuries. Took some ibuprofen with good relief. Has hx of significant arthritis on the MTP joints on bilaterally. No hx of gout. Has been wearing tight shoes occasionally and has to spend a significant amount of time on her feet for work. Has been using Dr. Felicie Morn inserts regularly.   Review of Systems  Respiratory: Negative for chest tightness and shortness of breath.   Cardiovascular: Negative for chest pain and leg swelling.  Musculoskeletal: Positive for arthralgias. Negative for back pain and joint swelling.       Objective:   Physical Exam  Constitutional: She appears well-developed and well-nourished. No distress.  Eyes: Right eye exhibits no discharge. Left eye exhibits no discharge. No scleral icterus.  Musculoskeletal:  Bony prominence over 1st MTP joints bilaterally. ROM of the 1st MTP joint limited on both sides. ROM is reduced more on the Left compared to the Right. Has tenderness on the MTP joint over the plantar surface.   On gait exam, she has leftward drift. Has mild scoliosis.   Skin: She is not diaphoretic.    Filed Vitals:   09/07/15 1500  BP: 118/76    Performed ultrasound on b/l feet.  Has bone spurs and osteoarthritic changes over the 1st MTP joints bilaterally as seen in the past on Xray. Has signs of chronic inflammation with doppler blood flow surrounding her 1st MTP joint.  Both sesamoids are visible bilaterally.  RT nedian sessaniud [place] displaced plantar and suspect has had subluxation of flexor hallucis     Assessment & Plan:  See problem based a&p.

## 2015-10-05 ENCOUNTER — Telehealth: Payer: Self-pay | Admitting: Family Medicine

## 2015-10-05 ENCOUNTER — Ambulatory Visit (HOSPITAL_COMMUNITY)
Admission: RE | Admit: 2015-10-05 | Discharge: 2015-10-05 | Disposition: A | Discharge status: Home / Self Care | Payer: 59 | Source: Ambulatory Visit | Attending: Family Medicine | Admitting: Family Medicine

## 2015-10-05 ENCOUNTER — Encounter: Payer: Self-pay | Admitting: Family Medicine

## 2015-10-05 ENCOUNTER — Ambulatory Visit (INDEPENDENT_AMBULATORY_CARE_PROVIDER_SITE_OTHER): Payer: 59 | Admitting: Family Medicine

## 2015-10-05 VITALS — BP 167/85 | HR 68 | Ht 62.0 in | Wt 160.0 lb

## 2015-10-05 DIAGNOSIS — S199XXA Unspecified injury of neck, initial encounter: Secondary | ICD-10-CM | POA: Diagnosis not present

## 2015-10-05 DIAGNOSIS — M4312 Spondylolisthesis, cervical region: Secondary | ICD-10-CM | POA: Insufficient documentation

## 2015-10-05 DIAGNOSIS — M542 Cervicalgia: Secondary | ICD-10-CM | POA: Insufficient documentation

## 2015-10-05 DIAGNOSIS — R202 Paresthesia of skin: Secondary | ICD-10-CM

## 2015-10-05 DIAGNOSIS — M50322 Other cervical disc degeneration at C5-C6 level: Secondary | ICD-10-CM | POA: Insufficient documentation

## 2015-10-05 DIAGNOSIS — R200 Anesthesia of skin: Secondary | ICD-10-CM

## 2015-10-05 DIAGNOSIS — M4802 Spinal stenosis, cervical region: Secondary | ICD-10-CM | POA: Insufficient documentation
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
## 2015-10-05 NOTE — Telephone Encounter (Signed)
Pt informed of neck image results

## 2015-10-05 NOTE — Telephone Encounter (Signed)
[place] Please let her know neck films OK I am sending full report via mail THANKS! Dorcas Mcmurray

## 2015-12-24 MED FILL — DESONIDE 0.05% CREAM: 0.05 | 20 days supply | Qty: 30 | Fill #0

## 2016-01-26 ENCOUNTER — Encounter: Payer: Self-pay | Admitting: Family Medicine

## 2016-01-26 ENCOUNTER — Ambulatory Visit (INDEPENDENT_AMBULATORY_CARE_PROVIDER_SITE_OTHER): Payer: 59 | Admitting: Family Medicine

## 2016-01-26 ENCOUNTER — Ambulatory Visit (HOSPITAL_COMMUNITY)
Admission: RE | Admit: 2016-01-26 | Discharge: 2016-01-26 | Disposition: A | Discharge status: Home / Self Care | Payer: 59 | Source: Ambulatory Visit | Attending: Family Medicine | Admitting: Family Medicine

## 2016-01-26 VITALS — BP 134/84 | HR 74 | Temp 97.9°F | Wt 157.0 lb

## 2016-01-26 DIAGNOSIS — I493 Ventricular premature depolarization: Secondary | ICD-10-CM | POA: Insufficient documentation

## 2016-01-26 DIAGNOSIS — R002 Palpitations: Secondary | ICD-10-CM | POA: Insufficient documentation

## 2016-01-26 DIAGNOSIS — I491 Atrial premature depolarization: Secondary | ICD-10-CM | POA: Insufficient documentation

## 2016-01-26 HISTORY — DX: Palpitations: R00.2

## 2016-01-26 LAB — CBC
HEMATOCRIT: 40.9 % (ref 35.0–45.0)
HEMOGLOBIN: 13.9 g/dL (ref 11.7–15.5)
MCH: 30.7 pg (ref 27.0–33.0)
MCHC: 34 g/dL (ref 32.0–36.0)
MCV: 90.3 fL (ref 80.0–100.0)
MPV: 10.4 fL (ref 7.5–12.5)
Platelets: 336 10*3/uL (ref 140–400)
RBC: 4.53 MIL/uL (ref 3.80–5.10)
RDW: 13.2 % (ref 11.0–15.0)
WBC: 6.7 10*3/uL (ref 3.8–10.8)

## 2016-01-26 LAB — TSH: TSH: 2.3 mIU/L

## 2016-01-26 NOTE — Progress Notes (Signed)
Subjective  Patient is presenting with the following illnesses  Palpitations Has had intermittently in the past.  Started very frequenlty about 48 hours ago.   Especially at night. No idea why. No chest pain or shortness of breath or lightheadness or syncope No new medications or otc herbals or recreational drugs or weight loss meds No heat intolerance Has been losing weight and exercising more  Chief Complaint noted Review of Symptoms - see HPI PMH - Smoking status noted.   Has had echo in 2009 was normal    Objective Vital Signs reviewed Alert nad Heart - Regular rate and rhythm.  No murmurs, gallops or rubs.    Lungs:  Normal respiratory effort, chest expands symmetrically. Lungs are clear to auscultation, no crackles or wheezes. Neck:  No deformities, thyromegaly, masses, or tenderness noted.   Supple with full range of motion without pain. Extremities:  No cyanosis, edema, or deformity noted with good range of motion of all major joints.    ECG - One PAC other wise normal  Assessments/Plans  No problem-specific Assessment & Plan notes found for this encounter.   See Encounter view if individual problem A/Ps not visible See after visit summary for details of patient instuctions

## 2016-01-26 NOTE — Patient Instructions (Signed)
Good to see you today!  Thanks for coming in.  I will call you if your tests are not good.  Otherwise I will send you a letter.  If you do not hear from me with in 2 weeks please call our office.     If any chest pain or shortness of breath or notice palpitations with exercise or immediately after let me know

## 2016-01-27 ENCOUNTER — Encounter: Payer: Self-pay | Admitting: Family Medicine

## 2016-01-27 LAB — COMPLETE METABOLIC PANEL WITH GFR
ALBUMIN: 4 g/dL (ref 3.6–5.1)
ALT: 18 U/L (ref 6–29)
AST: 13 U/L (ref 10–35)
Alkaline Phosphatase: 42 U/L (ref 33–130)
BUN: 9 mg/dL (ref 7–25)
CALCIUM: 9.2 mg/dL (ref 8.6–10.4)
CHLORIDE: 103 mmol/L (ref 98–110)
CO2: 25 mmol/L (ref 20–31)
CREATININE: 0.69 mg/dL (ref 0.50–1.05)
GFR, Est African American: >89 mL/min (ref >=60)
GFR, Est Non African American: >89 mL/min (ref >=60)
Glucose, Bld: 91 mg/dL (ref 65–99)
Potassium: 4 mmol/L (ref 3.5–5.3)
Sodium: 138 mmol/L (ref 135–146)
TOTAL PROTEIN: 6.4 g/dL (ref 6.1–8.1)
Total Bilirubin: 0.5 mg/dL (ref 0.2–1.2)

## 2016-01-27 NOTE — Assessment & Plan Note (Signed)
New onset.  No red flags on history or physical.  Likely physiologic.  Will check labs and monitor.  See after visit summary

## 2016-02-17 ENCOUNTER — Encounter: Payer: Self-pay | Admitting: Sports Medicine

## 2016-02-17 ENCOUNTER — Ambulatory Visit (INDEPENDENT_AMBULATORY_CARE_PROVIDER_SITE_OTHER): Payer: 59 | Admitting: Sports Medicine

## 2016-02-17 VITALS — BP 132/90 | Ht 62.0 in | Wt 157.0 lb

## 2016-02-17 DIAGNOSIS — M25561 Pain in right knee: Secondary | ICD-10-CM | POA: Diagnosis not present

## 2016-02-17 DIAGNOSIS — M2240 Chondromalacia patellae, unspecified knee: Secondary | ICD-10-CM | POA: Insufficient documentation

## 2016-02-17 NOTE — Assessment & Plan Note (Signed)
Having symptoms and examination findings consistent with chondromalacia patella. Ultrasound of her right knee showed possible spurring along the trochlear groove. We'll get x-rays to assess her joint space in her knee compartments. She'll follow-up as needed for this. Gave exercises for quad and hamstring strengthening. Gave specific once for VMO strengthening. She'll let us know if she wants formal physical therapy.

## 2016-02-17 NOTE — Progress Notes (Signed)
  Shannon Fields - 55 y.o. female MRN [id number]  Date of birth: Oct 12, 1960  SUBJECTIVE:  Including CC & ROS.  CC: right knee pain  Complains of right knee pain that has been ongoing for past couple weeks.  She has been trying to swim more- back stroke, breast stroke, freestyle.  She noticed that it became worse after that.  It woke her up at night and she has been self treating with motrin with relief.  Not noticed swelling, catching, locking, numbness, tingling.  Would like to know how to prevent worsening of this.    ROS: No unexpected weight loss, fever, chills, swelling, instability, muscle pain, numbness/tingling, redness, otherwise see HPI   PMHx - Updated and reviewed.  Contributory factors include: Negative PSHx - Updated and reviewed.  Contributory factors include:  Negative FHx - Updated and reviewed.  Contributory factors include:  Negative Social Hx - Updated and reviewed. Contributory factors include: Negative Medications - reviewed   DATA REVIEWED: Previous office visits and x-rays and MRI of her left leg  PHYSICAL EXAM:  VS: BP:132/90  HR: bpm  TEMP: ( )  RESP:   HT:5\' 2"  (157.5 cm)   WT:157 lb (71.2 kg)  BMI:28.8 PHYSICAL EXAM: Gen: NAD, alert, cooperative with exam, well-appearing HEENT: clear conjunctiva,  CV:  no edema, capillary refill brisk, normal rate Resp: non-labored Skin: no rashes, normal turgor  Neuro: no gross deficits.  Psych:  alert and oriented  Knee: Normal to inspection with no erythema or effusion or obvious bony abnormalities.  Did have a small mass palpated inferior lateral to the patella Palpation normal with no warmth, joint line tenderness, patellar tenderness, or condyle tenderness. ROM full in flexion and extension and lower leg rotation. Ligaments with solid consistent endpoints including ACL, PCL, LCL, MCL. Negative Mcmurray's, Apley's, and Thessalonian tests. Non painful patellar compression. Patellar glide with crepitus  bilaterally. Patellar and quadriceps tendons unremarkable. Hamstring and quadriceps strength is normal.   Ultrasound: Limited ultrasound performed of the right knee. Long and short axis of the quadriceps tendon revealed no hyper or hypoechoic changes along the tendon and long and short axis. No effusion present in the suprapatellar pouch. Medial and lateral joint line were unremarkable, medial and lateral meniscus without any hypoechoic hypoechoic changes. Trochlear groove was with some medial spurring. Evaluation of the mass that is inferior lateral to the patella reveals a hypoechoic mass along the lateral patellar tendon. It did not change in size with flexion and extension of the knee and stayed superficial to the patellar tendon. These are findings consistent with possible chondromalacia patella and a benign cyst seen on her patellar tendon.  ASSESSMENT & PLAN:   Chondromalacia, patella Having symptoms and examination findings consistent with chondromalacia patella. Ultrasound of her right knee showed possible spurring along the trochlear groove. We'll get x-rays to assess her joint space in her knee compartments. She'll follow-up as needed for this. Gave exercises for quad and hamstring strengthening. Gave specific once for VMO strengthening. She'll let us know if she wants formal physical therapy.

## 2016-02-25 ENCOUNTER — Ambulatory Visit (HOSPITAL_COMMUNITY)
Admission: RE | Admit: 2016-02-25 | Discharge: 2016-02-25 | Disposition: A | Discharge status: Home / Self Care | Payer: 59 | Source: Ambulatory Visit | Attending: Sports Medicine | Admitting: Sports Medicine

## 2016-02-25 DIAGNOSIS — M11261 Other chondrocalcinosis, right knee: Secondary | ICD-10-CM | POA: Insufficient documentation

## 2016-02-25 DIAGNOSIS — M25561 Pain in right knee: Secondary | ICD-10-CM | POA: Diagnosis not present

## 2016-02-25 IMAGING — CR DG KNEE COMPLETE 4+V*R*
4 series · 4 of 4 positions shown · non-contrast
Comparison: None.

CLINICAL DATA: Right knee pain starting [DATE], no known injury

EXAM:
RIGHT KNEE - COMPLETE 4+ VIEW

[knee ap]
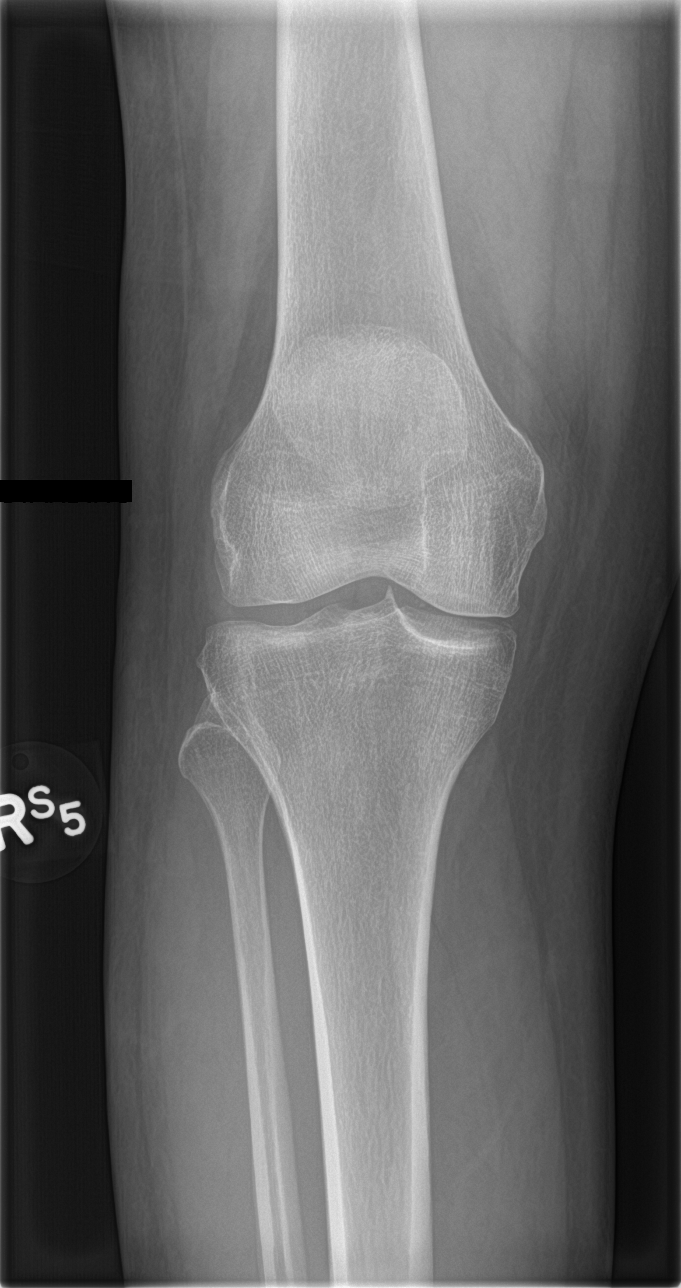

[tunnel]
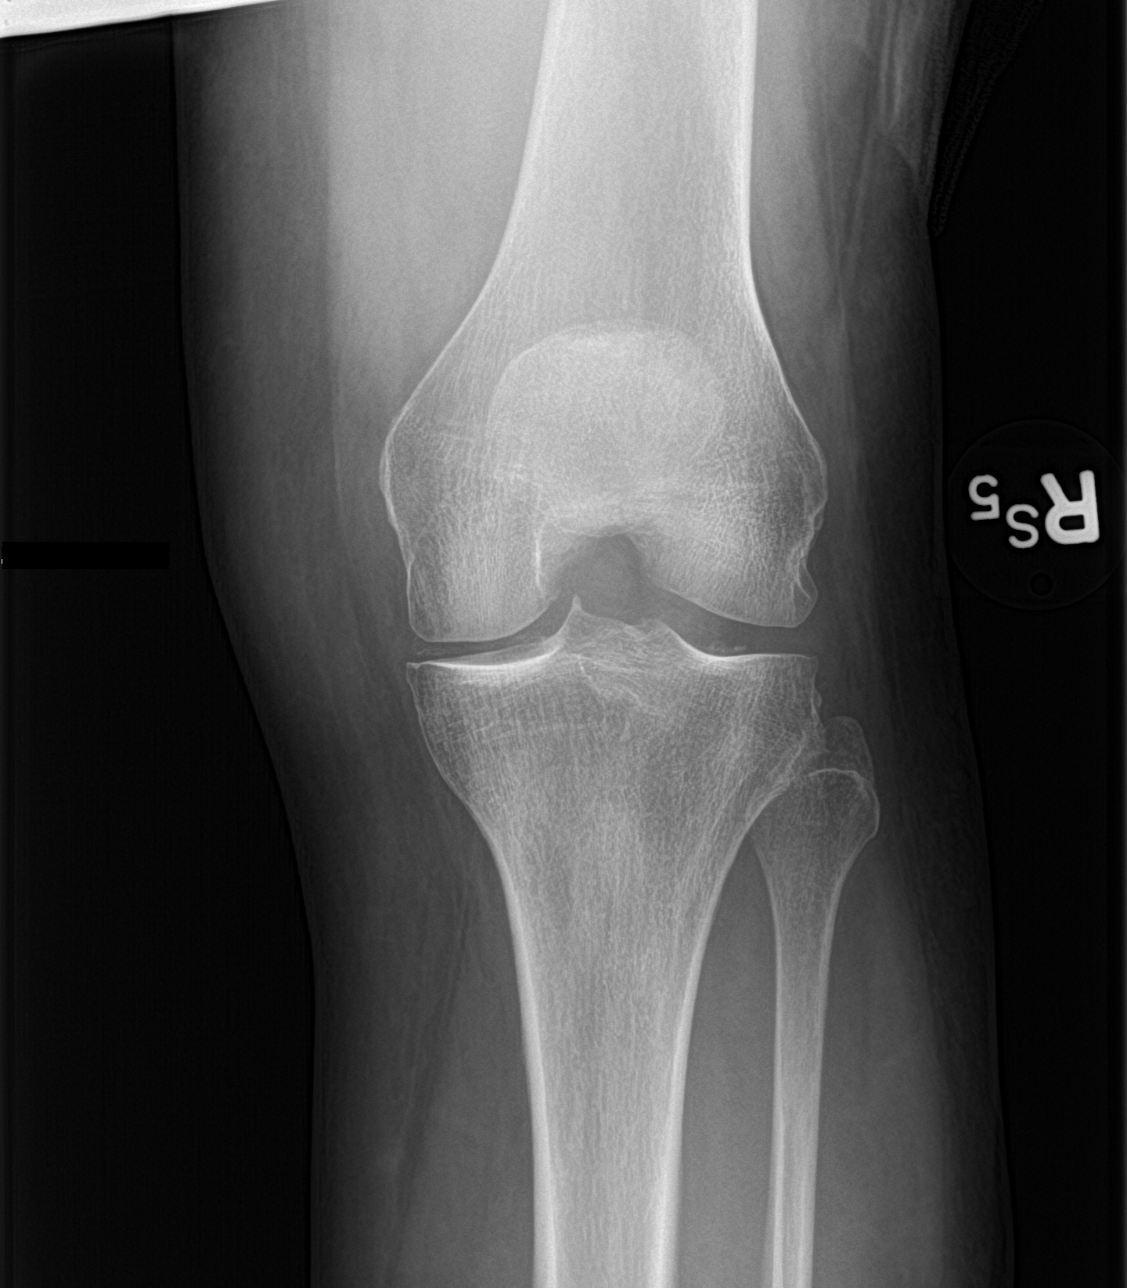

[knee lat]
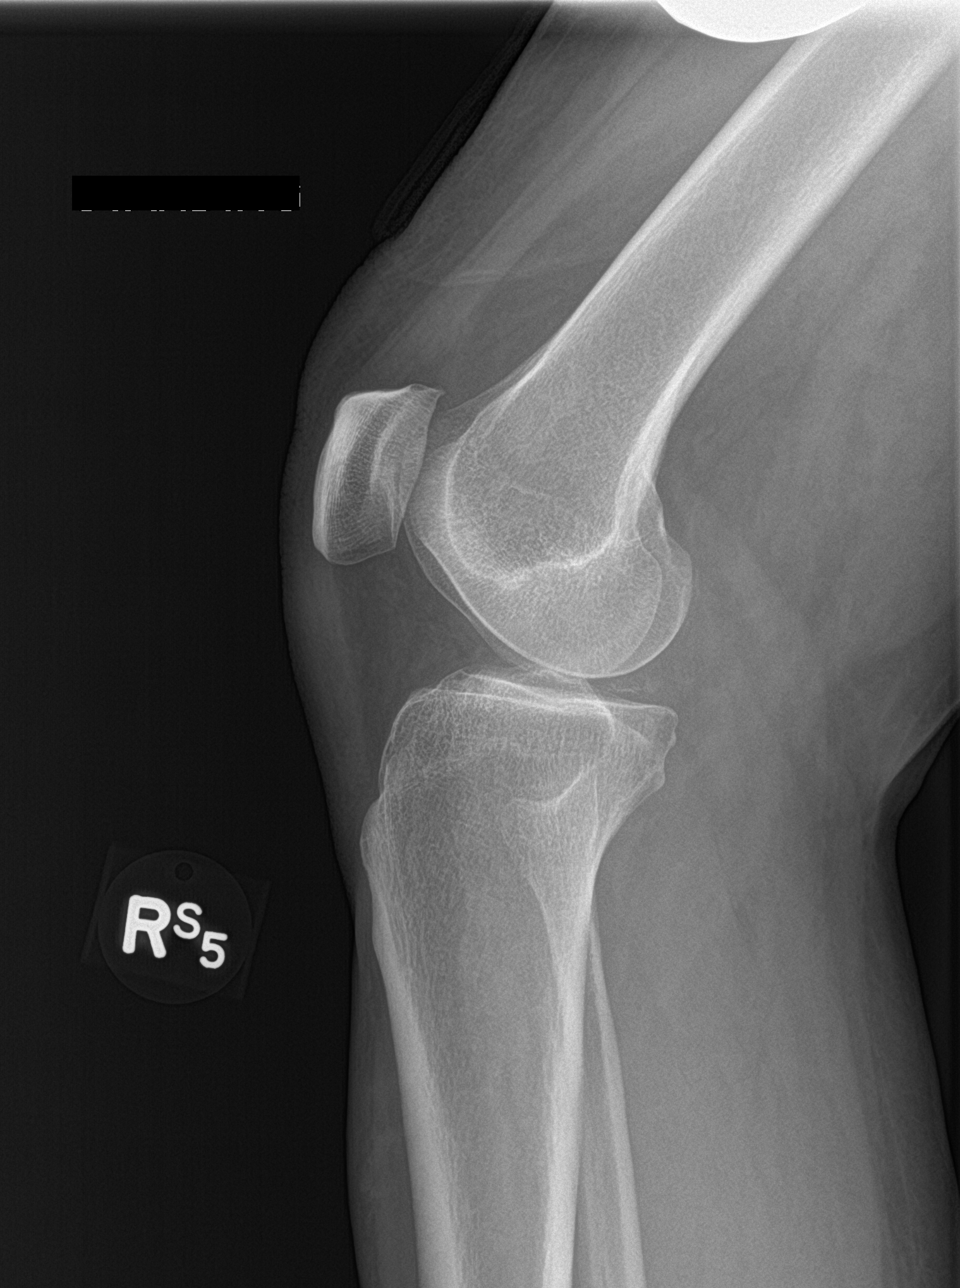

[knee sunrise]
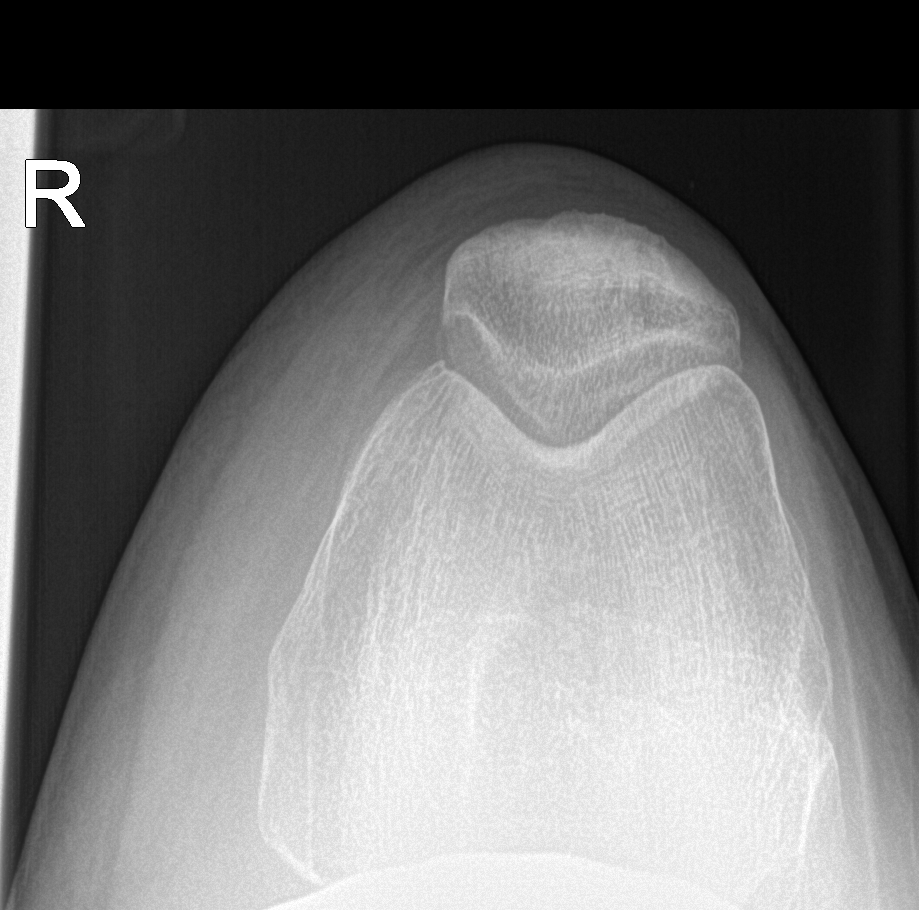

[4 of 4 positions shown; findings below may reference images not displayed]

FINDINGS: Four views of the right knee submitted. No acute fracture or
subluxation. Mild narrowing of medial joint compartment. Mild
chondrocalcinosis. Mild narrowing of patellofemoral joint space.
IMPRESSION: No acute fracture or subluxation. Mild chondrocalcinosis.
Degenerative changes.

## 2016-02-29 ENCOUNTER — Telehealth: Payer: Self-pay | Admitting: Sports Medicine

## 2016-02-29 NOTE — Telephone Encounter (Signed)
  I spoke with Shannon Fields on the phone today after reviewing x-rays of her knee. She has some mild degenerative changes and some mild chondrocalcinosis but it is not marked. Her symptoms are improving. She will follow-up with me as needed.

## 2016-05-09 ENCOUNTER — Other Ambulatory Visit: Payer: Self-pay | Admitting: Family Medicine

## 2016-05-09 DIAGNOSIS — Z1231 Encounter for screening mammogram for malignant neoplasm of breast: Secondary | ICD-10-CM

## 2016-06-02 DIAGNOSIS — H524 Presbyopia: Secondary | ICD-10-CM | POA: Diagnosis not present

## 2016-06-02 DIAGNOSIS — H5213 Myopia, bilateral: Secondary | ICD-10-CM | POA: Diagnosis not present

## 2016-06-02 DIAGNOSIS — H52203 Unspecified astigmatism, bilateral: Secondary | ICD-10-CM | POA: Diagnosis not present

## 2016-06-14 ENCOUNTER — Ambulatory Visit: Payer: 59

## 2016-07-04 ENCOUNTER — Ambulatory Visit (INDEPENDENT_AMBULATORY_CARE_PROVIDER_SITE_OTHER): Payer: 59 | Admitting: *Deleted

## 2016-07-04 ENCOUNTER — Other Ambulatory Visit: Payer: Self-pay | Admitting: Family Medicine

## 2016-07-04 DIAGNOSIS — Z23 Encounter for immunization: Secondary | ICD-10-CM | POA: Diagnosis not present

## 2016-07-04 MED ORDER — CIPROFLOXACIN HCL 500 MG PO TABS: 500.0000 mg | ORAL_TABLET | Freq: Two times a day (BID) | ORAL | 0 refills | Status: AC

## 2016-07-04 MED FILL — CIPROFLOXACIN HCL 500 MG TA: 500 | 3 days supply | Qty: 6 | Fill #0

## 2016-07-04 NOTE — Progress Notes (Signed)
   Shannon Fields presents for immunizations.    Screening questions for immunizations: 1. Are you sick today?  no 2. Do you have allergies to medications, foods, or any vaccines?  no 3. Have you ever had a serious reaction after receiving a vaccination?  no 4. Do you have a long-term health problem with heart disease, asthma, lung disease, kidney disease, metabolic disease (e.g. diabetes), anemia, or other blood disorder?  no 5. Have you had a seizure, brain problem, or other nervous system problem?  no 6. Do you have cancer, leukemia, AIDS, or any other immune system problem?  no 7. Do you take cortisone, prednisone, other steroids, anticancer drugs or have you had radiation treatments?  no 8. Have you received a transfusion of blood or blood products, or been given immune (gamma) globulin or an antiviral drug in the past year?  no 9. Have you received vaccinations in the past 4 weeks?  no 10. FEMALES ONLY: Are you pregnant or is there a chance you could become pregnant during the next month?  no   Derl Barrow, RN

## 2016-07-10 ENCOUNTER — Ambulatory Visit
Admission: RE | Admit: 2016-07-10 | Discharge: 2016-07-10 | Disposition: A | Discharge status: Home / Self Care | Payer: 59 | Source: Ambulatory Visit | Attending: Family Medicine | Admitting: Family Medicine

## 2016-07-10 DIAGNOSIS — Z1231 Encounter for screening mammogram for malignant neoplasm of breast: Secondary | ICD-10-CM | POA: Diagnosis not present

## 2016-08-31 DIAGNOSIS — D2272 Melanocytic nevi of left lower limb, including hip: Secondary | ICD-10-CM | POA: Diagnosis not present

## 2016-08-31 DIAGNOSIS — L918 Other hypertrophic disorders of the skin: Secondary | ICD-10-CM | POA: Diagnosis not present

## 2016-08-31 DIAGNOSIS — D2271 Melanocytic nevi of right lower limb, including hip: Secondary | ICD-10-CM | POA: Diagnosis not present

## 2016-08-31 DIAGNOSIS — L738 Other specified follicular disorders: Secondary | ICD-10-CM | POA: Diagnosis not present

## 2016-08-31 DIAGNOSIS — L814 Other melanin hyperpigmentation: Secondary | ICD-10-CM | POA: Diagnosis not present

## 2016-08-31 DIAGNOSIS — D225 Melanocytic nevi of trunk: Secondary | ICD-10-CM | POA: Diagnosis not present

## 2016-08-31 DIAGNOSIS — D2239 Melanocytic nevi of other parts of face: Secondary | ICD-10-CM | POA: Diagnosis not present

## 2016-08-31 DIAGNOSIS — D1801 Hemangioma of skin and subcutaneous tissue: Secondary | ICD-10-CM | POA: Diagnosis not present

## 2016-08-31 DIAGNOSIS — D2262 Melanocytic nevi of left upper limb, including shoulder: Secondary | ICD-10-CM | POA: Diagnosis not present

## 2016-09-12 ENCOUNTER — Ambulatory Visit (INDEPENDENT_AMBULATORY_CARE_PROVIDER_SITE_OTHER): Payer: 59 | Admitting: Family Medicine

## 2016-09-12 DIAGNOSIS — E663 Overweight: Secondary | ICD-10-CM | POA: Diagnosis not present

## 2016-09-12 NOTE — Progress Notes (Signed)
Medical Nutrition Therapy:  Appt start time: 1130 end time:  1230.  Assessment:  Primary concerns today: Weight management.  Shannon Fields is concerned about her weight and BP, and wants to optimize her health behaviors.  She finished her masters degree last year, but still have not found the time to exercise as she'd like, as she has continued to be involved with multiple projects and volunteering.  Night time snacking is also an issue related to emotional eating and fatigue.    We talked at length about meeting both emotional needs and sleep needs, and I shared the Urge process of managing derailing thoughts.  Also emphasized the importance of adequate sleep to meeting any behavioral goals.    Learning Readiness: Ready  Usual eating pattern includes 2 meals and 1-2 snacks per day. Frequent foods and beverages include water, coffee w/ vanilla soy creamer.  Avoided foods include red meat.   Usual physical activity includes none currently.  Has a NordicTrak at home, and home pool during summer.    24-hr recall: (Up at 5 AM) B (7 AM)-   1 c coffee, 2 T soy creamer Snk (8:30)-   1 banana (on way to work) L (2 PM)-  1 large salad, sunflr sds, garbanzos, 2 T lite drsng, diet Vitamin water Snk (4 PM)-  1 string cheese Snk (7:30)-  1 c Goldfish, water D (9 PM)-  4 oz pork tenderloin, zucchini & mushrms Snk ( PM)-  2 Hershey kisses, 1/3 c jellybeans, 1 glass wine Typical day? Yes.   Usually has 1-2 glasses of wine once a weekend, plus 1 glass 1-2 X wk.    Progress Towards Goal(s):  In progress.   Nutritional Diagnosis:  [place]-3.3 Overweight/obesity As related to energy balance.  As evidenced by BMI >28.    Intervention:  Nutrition education.    Handouts given during visit include:  AVS  Goals Sheet  Urge process   Feelings list  Demonstrated degree of understanding via:  Teach Back  Barriers to learning/adherence to lifestyle change: Inadequate sleep.  Monitoring/Evaluation:  Dietary intake,  exercise, and body weight in 4 week(s).

## 2016-09-12 NOTE — Patient Instructions (Addendum)
-   Make a list of 7-10 dinner meals that taste good, are relatively quick and easy to prepare, and that meet your nutritional needs.  Use the "plate approach" to meal planning as you consider these meals.  Use this as a basis for shopping, so you can make one of these meals any time.  Bring your list to your follow-up appointment for review.    - Use the [phone] four-step process of challenging invasive thoughts as needed:  (1) Name it; (2) Frame it; (3) Envision; and (4) Intentional focus shifting.  Writing your answers will be more effective.    - A good starting point for this process is to make a list of possible activities (Step 4).   - All meals should include some protein.    Goals:   1. Obtain twice as many veg's as protein or carbohydrate foods for both lunch and dinner.  - Use smaller plates for meals, and even utensils (both serving and eating).  2. SLEEP:  Document the number of hours of sleep you get nightly, e.g., HoursTracker app.    - Objective:  Improve weekly total sleep hours.  3. Physical activity: Determine a weekly routine that is likely to work best.    - Exercise at least 30 min 3 times a week (walk or NordicTrak or other.)  - Track # of minutes you exercise.

## 2016-09-28 DIAGNOSIS — Z6828 Body mass index (BMI) 28.0-28.9, adult: Secondary | ICD-10-CM | POA: Diagnosis not present

## 2016-09-28 DIAGNOSIS — Z01419 Encounter for gynecological examination (general) (routine) without abnormal findings: Secondary | ICD-10-CM | POA: Diagnosis not present

## 2016-10-19 ENCOUNTER — Ambulatory Visit: Payer: 59 | Admitting: Family Medicine

## 2016-10-30 DIAGNOSIS — Z13820 Encounter for screening for osteoporosis: Secondary | ICD-10-CM | POA: Diagnosis not present

## 2016-11-16 ENCOUNTER — Encounter: Payer: Self-pay | Admitting: Family Medicine

## 2016-11-16 ENCOUNTER — Ambulatory Visit (INDEPENDENT_AMBULATORY_CARE_PROVIDER_SITE_OTHER): Payer: 59 | Admitting: Family Medicine

## 2016-11-16 DIAGNOSIS — E663 Overweight: Secondary | ICD-10-CM | POA: Diagnosis not present

## 2016-11-16 NOTE — Progress Notes (Signed)
Medical Nutrition Therapy:  Appt start time: 140 end time:  1500.  Assessment:  Primary concerns today: Weight management.  Nemiah has been making a good effort to increase veg's.  She has been eating candy less often as well.  Her work schedule is still very demanding, and includes some travel, which makes both diet and exercise goals more difficult to reach.    Recent physical activity started off well, including ~30 min Nordic Track 2 X wk and a swivel board with a weight for ~10 min 3-5 X wk.  Consistency has been difficult to maintain, however, especially with travel.    24-hr recall:  (Up at 6:30 AM) B (7 AM)-  1 c coffee, vanilla soy creamer, water Snk (10 AM)-  5 oz [place] yogurt (80 kcal), 1 c coffee, vanilla soy creamer L ( PM)-  --- Snk (6 PM)-  Chips, salsa D (8:30)-  1/2 portion duck, 1 c collards, few bites chscake, 12 oz beer, water Snk ( PM)-  water Typical day? No. Had just returned from out of town, so few groceries at home.  Worked through lunch.    Progress Towards Goal(s):  In progress.   Nutritional Diagnosis:  Some progress noted on  -3.3 Overweight/obesity As related to energy balance.  As evidenced by weight loss of >4 lb since April.    Intervention:  Nutrition education.    Handouts given during visit include:  AVS  Goals Sheet, revised   Barriers to learning/adherence to lifestyle change: Inadequate sleep and time constraints.  Monitoring/Evaluation:  Dietary intake, exercise, and body weight in 5 week(s).

## 2016-11-16 NOTE — Patient Instructions (Addendum)
CONSISTENCY is the name of the game - with respect to both food choices and physical activity!    Goals: 1. Physical activity:   - At least 30 min swimming 2-3 X wk.  - At least 15 min Nordic Track & 10 min swivel board 2 X wk.    - Each weekend, plan ahead for exercise times in the upcoming week. (Aim for consistent schedule.)  - Document your progress   - Plan to use the hotel fitness center when you are away at least 1-2 times.  Contact one of your colleagues to ask about joining you for a workout before you go out of town.   2. Eat at least 3 REAL meals and 1 snack each day.  (A real meal includes at least a source of protein, starch, and veg's and/or fruit.  (Twice the volume of veg's as either protein or carb for lunch & dinner.) 3. Obtain twice as many veg's as protein or carbohydrate foods for both lunch and dinner.

## 2016-12-19 ENCOUNTER — Ambulatory Visit: Payer: 59 | Admitting: Family Medicine

## 2017-01-08 ENCOUNTER — Ambulatory Visit (INDEPENDENT_AMBULATORY_CARE_PROVIDER_SITE_OTHER): Payer: 59 | Admitting: Family Medicine

## 2017-01-08 ENCOUNTER — Encounter: Payer: Self-pay | Admitting: Family Medicine

## 2017-01-08 DIAGNOSIS — E663 Overweight: Secondary | ICD-10-CM | POA: Diagnosis not present

## 2017-01-08 NOTE — Patient Instructions (Addendum)
-   Goal of intermittent physical activity during the day:  For every hour you sit, get up and walk for 5 minutes.   - Breakfast: an 80-kcal yogurt is not enough!  A minimum of 300 calories is a good goal, e.g.,   - yogurt + fruit + nuts/seeds  - 2 eggs + fruit (1 piece or 1 cup) + 1 slc whole-wheat toast  - (Cereal or bananas:  Pay attn to how you respond to a whole banana or cereal breakfast. Do you notice feeling hungry sooner?  Look for at least 5 or 6 grams of fiber per serving in cereals; protein is a bonus.)  - Lunch: Include a vegetable, even if you are eating just a small lunch.    - Dinner: Obtain twice as many veg's as protein or carbohydrate foods.  - Complete Goals Sheet with revised goals as follows:  1. At least 30:00 swimming 2-3 X wk; at least 15:00 Nordic Track 1 X wk; & 10:00 swivel board 2 X wk. 2. Eat breakfast, lunch, & dinner, conforming to recommended meals (breakfast >300 kcal; lunch to include veg's; & dinner twice volume of veg's vs. pro & carb).   - Complete the form provided today to determine at least 7 dinner meals that can be your "go-to" meals.   - Remember for those days you're considering take-out, it's not all-or-none: You can bring home an entree, and add a veg to that.    - See gretchenrubin.com for the Tendencies quiz.  Email Jeannie what your score tells about your tendency.    - THE KEY TO MAKING ALL THIS WORK WILL BE PLANNING AHEAD.   - Make leftovers at dinner time for lunch the next day.    - Make big batches of veg soups, and freeze portions.    - Keep on hand frozen veg's, fresh "grabbit" veg's, wrap options in your freezer, fresh fruit.

## 2017-01-08 NOTE — Progress Notes (Signed)
Medical Nutrition Therapy:  Appt start time: 140 end time:  1500.  Assessment:  Primary concerns today: Weight management.  Shannon Fields has been doing some traveling, has had an Cytogeneticist, and got a bad URI, all of which impacted her ability to meet her behavioral goals.  In addn, she has been working 50-60 hrs a week.  Her youngest daughter Shannon Fields just left for the [employer] in [place], and her daughter Shannon Fields leaves for college on Friday, so Shannon Fields feels now is a time she can at least devote a bit more time and energy to establishing some healthy behaviors for herself.    Recent physical activity has included swimming ~30 min 2-3 X wk.  She has not been able to use the [employer] or swivel board b/c of time constraints.    24-hr recall:  (Up at 11:30 AM) B ( PM)-  --- Snk ( AM)-  --- L (12 PM)-  2 fried eggs (olive oil), 1 slc toast with spray butter, coffee with 2 tbsp vanilla soy creamer, diet vitamin water Snk (2 PM)-  Iced coffee, 2 tbsp vanilla soy creamer Snk (5 PM)-  4 tbsp hummus, 7-8 triscuits, 1/2 c blueberries, water D (8:30 PM)-  6 oz grilled cod, 1 1/2 c zucchini & mushrooms, 1/4 c blueberries, 1 c pineapple, water Snk (9:30)-  4 vanilla wafers (60 kcal), 1 glass wine Typical day? No.  It was a very unusual (late) time to get up.  Foods were pretty normal, but timing was not.  Weekday breakfast is 80-kcal [place] yogurt with a cup of coffee, then possibly some almonds or cheese stick mid-morning.      Progress Towards Goal(s):  In progress.   Nutritional Diagnosis:  Continue progress noted on [place]-3.3 Overweight/obesity As related to energy balance.  As evidenced by weight loss maintenance (loss of 1.4 lb since June).    Intervention:  Nutrition education.    Handouts given during visit include:  AVS  Goals Sheet, revised  Meal planning form   Barriers to learning/adherence to lifestyle change: Inadequate sleep and time constraints.  Monitoring/Evaluation:   Dietary intake, exercise, and body weight in 8 week(s).

## 2017-03-12 ENCOUNTER — Ambulatory Visit: Payer: 59 | Admitting: Family Medicine

## 2017-03-28 ENCOUNTER — Ambulatory Visit (INDEPENDENT_AMBULATORY_CARE_PROVIDER_SITE_OTHER): Payer: 59 | Admitting: Family Medicine

## 2017-03-28 ENCOUNTER — Encounter: Payer: Self-pay | Admitting: Family Medicine

## 2017-03-28 VITALS — BP 172/92 | HR 65 | Temp 98.0°F | Ht 62.0 in | Wt 146.6 lb

## 2017-03-28 DIAGNOSIS — R030 Elevated blood-pressure reading, without diagnosis of hypertension: Secondary | ICD-10-CM

## 2017-03-28 DIAGNOSIS — Z1159 Encounter for screening for other viral diseases: Secondary | ICD-10-CM

## 2017-03-28 DIAGNOSIS — Z114 Encounter for screening for human immunodeficiency virus [HIV]: Secondary | ICD-10-CM

## 2017-03-28 DIAGNOSIS — Z13220 Encounter for screening for lipoid disorders: Secondary | ICD-10-CM | POA: Diagnosis not present

## 2017-03-28 NOTE — Patient Instructions (Signed)
Good to see you today!  Thanks for coming in.  Follow your blood pressure.  Let me know if regularly high  Great work on the weight loss  Start to exercise - Nordic track  Follow up as needed for stress or for blood pressure

## 2017-03-28 NOTE — Assessment & Plan Note (Signed)
Not controlled here.  Encourage to monitor at home and continue to work with counselor on stress.  See after visit summary

## 2017-03-28 NOTE — Progress Notes (Signed)
Subjective  Patient is presenting with the following illnesses  Stress - because of having and empty nest. Work uncertainty and marital issues.   Is seeing a counselor and feels is slowly improving.  Has been exercising (a little less now that pool is closed) and dieting to lose weight.  Elevated BP Not checking at home.  Was good at her Gyn visit.  Taking ibuprofen regularly.  No edema or chest pain  Does have intermittent palpitations but exercises well   Patient reports no  vision/changes,anorexia, weight change, fever ,adenopathy, persistant / recurrent hoarseness, swallowing issues, chest pain, edema,persistant / recurrent cough, hemoptysis, dyspnea(rest, exertional, paroxysmal nocturnal), gastrointestinal  bleeding (melena, rectal bleeding), abdominal pain, excessive heart burn, GU symptoms(dysuria, hematuria, pyuria, voiding/incontinence  Issues) syncope, focal weakness, severe memory loss, concerning skin lesions, depression, anxiety, abnormal bruising/bleeding, major joint swelling, breast masses or abnormal vaginal bleeding.     Chief Complaint noted Review of Symptoms - see HPI PMH - Smoking status noted.     Objective Vital Signs reviewed Heart - Regular rate and rhythm.  No murmurs, gallops or rubs.    Lungs:  Normal respiratory effort, chest expands symmetrically. Lungs are clear to auscultation, no crackles or wheezes. Neck:  No deformities, thyromegaly, masses, or tenderness noted.   Supple with full range of motion without pain. Extremities:  No cyanosis, edema, or deformity noted with good range of motion of all major joints.      Assessments/Plans  No problem-specific Assessment & Plan notes found for this encounter.  Prevention See after visit summary - regular diet and exercise   Stress - encouraged to continue counseling and see me if worsens

## 2017-03-29 ENCOUNTER — Encounter: Payer: Self-pay | Admitting: Family Medicine

## 2017-03-29 LAB — CBC
HEMATOCRIT: 41.8 % (ref 34.0–46.6)
HEMOGLOBIN: 14.3 g/dL (ref 11.1–15.9)
MCH: 30.9 pg (ref 26.6–33.0)
MCHC: 34.2 g/dL (ref 31.5–35.7)
MCV: 90 fL (ref 79–97)
Platelets: 320 10*3/uL (ref 150–379)
RBC: 4.63 x10E6/uL (ref 3.77–5.28)
RDW: 13.1 % (ref 12.3–15.4)
WBC: 7.5 10*3/uL (ref 3.4–10.8)

## 2017-03-29 LAB — CMP14+EGFR
ALBUMIN: 4.5 g/dL (ref 3.5–5.5)
ALT: 15 IU/L (ref 0–32)
AST: 14 IU/L (ref 0–40)
Albumin/Globulin Ratio: 2.1 (ref 1.2–2.2)
Alkaline Phosphatase: 52 IU/L (ref 39–117)
BILIRUBIN TOTAL: 0.5 mg/dL (ref 0.0–1.2)
BUN / CREAT RATIO: 17 (ref 9–23)
BUN: 13 mg/dL (ref 6–24)
CALCIUM: 9.6 mg/dL (ref 8.7–10.2)
CHLORIDE: 101 mmol/L (ref 96–106)
CO2: 26 mmol/L (ref 20–29)
CREATININE: 0.75 mg/dL (ref 0.57–1.00)
GFR, EST AFRICAN AMERICAN: 103 mL/min/{1.73_m2} (ref >59)
GFR, EST NON AFRICAN AMERICAN: 89 mL/min/{1.73_m2} (ref >59)
Globulin, Total: 2.1 g/dL (ref 1.5–4.5)
Glucose: 115 mg/dL — ABNORMAL HIGH (ref 65–99)
Potassium: 4.3 mmol/L (ref 3.5–5.2)
Sodium: 142 mmol/L (ref 134–144)
TOTAL PROTEIN: 6.6 g/dL (ref 6.0–8.5)

## 2017-03-29 LAB — LIPID PANEL
CHOL/HDL RATIO: 2.2 ratio (ref 0.0–4.4)
Cholesterol, Total: 174 mg/dL (ref 100–199)
HDL: 80 mg/dL (ref >39)
LDL CALC: 78 mg/dL (ref 0–99)
Triglycerides: 79 mg/dL (ref 0–149)
VLDL CHOLESTEROL CAL: 16 mg/dL (ref 5–40)

## 2017-03-29 LAB — HIV ANTIBODY (ROUTINE TESTING W REFLEX): HIV SCREEN 4TH GENERATION: NONREACTIVE

## 2017-03-29 LAB — HEPATITIS C ANTIBODY: Hep C Virus Ab: <0.1 s/co ratio (ref 0.0–0.9)

## 2017-03-29 NOTE — Progress Notes (Signed)
Subjective  Patient is presenting with the following illnesses     Chief Complaint noted Review of Symptoms - see HPI PMH - Smoking status noted.     Objective Vital Signs reviewed     Assessments/Plans  No problem-specific Assessment & Plan notes found for this encounter.   See after visit summary for details of patient instuctions 

## 2017-04-02 DIAGNOSIS — M2022 Hallux rigidus, left foot: Secondary | ICD-10-CM | POA: Diagnosis not present

## 2017-04-02 DIAGNOSIS — M2021 Hallux rigidus, right foot: Secondary | ICD-10-CM | POA: Diagnosis not present

## 2017-04-05 ENCOUNTER — Ambulatory Visit (INDEPENDENT_AMBULATORY_CARE_PROVIDER_SITE_OTHER): Payer: 59 | Admitting: Sports Medicine

## 2017-04-05 ENCOUNTER — Encounter: Payer: Self-pay | Admitting: Sports Medicine

## 2017-04-05 DIAGNOSIS — M542 Cervicalgia: Secondary | ICD-10-CM
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
## 2017-06-27 ENCOUNTER — Other Ambulatory Visit: Payer: Self-pay | Admitting: Family Medicine

## 2017-06-27 DIAGNOSIS — Z1231 Encounter for screening mammogram for malignant neoplasm of breast: Secondary | ICD-10-CM

## 2017-07-06 DIAGNOSIS — H5213 Myopia, bilateral: Secondary | ICD-10-CM | POA: Diagnosis not present

## 2017-07-06 DIAGNOSIS — H524 Presbyopia: Secondary | ICD-10-CM | POA: Diagnosis not present

## 2017-07-06 DIAGNOSIS — H52203 Unspecified astigmatism, bilateral: Secondary | ICD-10-CM | POA: Diagnosis not present

## 2017-07-25 ENCOUNTER — Ambulatory Visit
Admission: RE | Admit: 2017-07-25 | Discharge: 2017-07-25 | Disposition: A | Discharge status: Home / Self Care | Payer: 59 | Source: Ambulatory Visit | Attending: Family Medicine | Admitting: Family Medicine

## 2017-07-25 DIAGNOSIS — Z1231 Encounter for screening mammogram for malignant neoplasm of breast: Secondary | ICD-10-CM | POA: Diagnosis not present

## 2017-08-07 ENCOUNTER — Ambulatory Visit (INDEPENDENT_AMBULATORY_CARE_PROVIDER_SITE_OTHER): Payer: 59 | Admitting: Family Medicine

## 2017-08-07 ENCOUNTER — Encounter: Payer: Self-pay | Admitting: Family Medicine

## 2017-08-07 ENCOUNTER — Other Ambulatory Visit: Payer: Self-pay

## 2017-08-07 DIAGNOSIS — J209 Acute bronchitis, unspecified: Secondary | ICD-10-CM | POA: Insufficient documentation

## 2017-08-07 DIAGNOSIS — R030 Elevated blood-pressure reading, without diagnosis of hypertension: Secondary | ICD-10-CM | POA: Diagnosis not present

## 2017-08-07 MED ORDER — BENZONATATE 100 MG PO CAPS: 100.0000 mg | ORAL_CAPSULE | Freq: Two times a day (BID) | ORAL | 0 refills | Status: DC | PRN

## 2017-08-07 NOTE — Progress Notes (Signed)
Subjective  Patient is presenting with the following illnesses   URI  Major symptoms: cough for last 21/2 weeks Progression: no real change.  No shortness of breath.  Does have sputum mostly clear.  Aches in her chest  Medications tried: Robitussin Sick contacts: many   Symptoms Fever: no Headache or face pain: no Tooth pain: no Sneezing: mild Scratchy throat: mild Allergies: no Muscle aches: some pain in R groin - was coughing and pulled a muscle Severe fatigue: yes Stiff neck: no Shortness of breath: no Rash: no Sore throat or swollen glands: mild    ROS see HPI Smoking Status noted    Chief Complaint noted Review of Symptoms - see HPI PMH - Smoking status noted.     Objective Vital Signs reviewed Lungs - diffuse mild rhonchi, no wheeze or dullness equal lung sounds  Heart - Regular rate and rhythm.  No murmurs, gallops or rubs.    Skin:  Intact without suspicious lesions or rashes Neck:  No deformities, thyromegaly, masses, or tenderness noted.   Supple with full range of motion without pain. Throat: normal mucosa, no exudate, uvula midline, no redness   Assessments/Plans  Acute bronchitis Likely viral bronchitis with prolonged course.  No signs of focal bacterial infection.  Discussed using antibiotics since prolonged.  She would prefer to wait.  Added additional antitussive   Elevated BP without diagnosis of hypertension Increased today likely due to stress of illness and NSAID use.  Recommend monitoring and reduce/eliminate NSAIDs using tylenol instead    See after visit summary for details of patient instuctions

## 2017-08-07 NOTE — Assessment & Plan Note (Signed)
Increased today likely due to stress of illness and NSAID use.  Recommend monitoring and reduce/eliminate NSAIDs using tylenol instead

## 2017-08-07 NOTE — Patient Instructions (Signed)
Good to see you today!  Thanks for coming in.  Use the tessalon perles as needed  Call me if you would like an antibiotic or if you have fever or focal chest pain  Monitor your blood pressure come back when you are feeling better to discuss.  Cut back on ibuprofen

## 2017-08-07 NOTE — Assessment & Plan Note (Signed)
Likely viral bronchitis with prolonged course.  No signs of focal bacterial infection.  Discussed using antibiotics since prolonged.  She would prefer to wait.  Added additional antitussive

## 2017-08-15 ENCOUNTER — Telehealth: Payer: Self-pay | Admitting: Family Medicine

## 2017-08-15 NOTE — Telephone Encounter (Signed)
Pt said that she goes to [facility] to get her Pap Smear.  She last had her exam March 2018 and is due for another one. She will make her appt.  Pt also said she got a mammogram at the end February (2019) and had the results sent here.

## 2017-09-04 ENCOUNTER — Telehealth: Payer: Self-pay | Admitting: Family Medicine

## 2017-09-04 MED ORDER — OSELTAMIVIR PHOSPHATE 75 MG PO CAPS
75.0000 mg | ORAL_CAPSULE | Freq: Every day | ORAL | 0 refills | Status: DC
Start: 2017-09-04 — End: 2018-05-15

## 2017-09-04 MED FILL — OSELTAMIVIR PHOSPHATE 75 MG: 75 | 7 days supply | Qty: 7 | Fill #0

## 2017-09-06 NOTE — Telephone Encounter (Signed)
Exposed to influ  Requests prophylaxis

## 2017-09-10 DIAGNOSIS — H5310 Unspecified subjective visual disturbances: Secondary | ICD-10-CM | POA: Diagnosis not present

## 2017-09-10 DIAGNOSIS — H43813 Vitreous degeneration, bilateral: Secondary | ICD-10-CM | POA: Diagnosis not present

## 2017-10-04 DIAGNOSIS — H43813 Vitreous degeneration, bilateral: Secondary | ICD-10-CM | POA: Diagnosis not present

## 2017-10-17 DIAGNOSIS — D225 Melanocytic nevi of trunk: Secondary | ICD-10-CM | POA: Diagnosis not present

## 2017-10-17 DIAGNOSIS — D1801 Hemangioma of skin and subcutaneous tissue: Secondary | ICD-10-CM | POA: Diagnosis not present

## 2017-10-17 DIAGNOSIS — D2271 Melanocytic nevi of right lower limb, including hip: Secondary | ICD-10-CM | POA: Diagnosis not present

## 2017-10-17 DIAGNOSIS — D2262 Melanocytic nevi of left upper limb, including shoulder: Secondary | ICD-10-CM | POA: Diagnosis not present

## 2017-10-17 DIAGNOSIS — L814 Other melanin hyperpigmentation: Secondary | ICD-10-CM | POA: Diagnosis not present

## 2017-10-17 DIAGNOSIS — D2239 Melanocytic nevi of other parts of face: Secondary | ICD-10-CM | POA: Diagnosis not present

## 2017-10-17 DIAGNOSIS — D2272 Melanocytic nevi of left lower limb, including hip: Secondary | ICD-10-CM | POA: Diagnosis not present

## 2017-10-17 DIAGNOSIS — D2261 Melanocytic nevi of right upper limb, including shoulder: Secondary | ICD-10-CM | POA: Diagnosis not present

## 2017-10-17 DIAGNOSIS — L821 Other seborrheic keratosis: Secondary | ICD-10-CM | POA: Diagnosis not present

## 2017-11-08 DIAGNOSIS — H43813 Vitreous degeneration, bilateral: Secondary | ICD-10-CM | POA: Diagnosis not present

## 2017-11-28 MED FILL — DESONIDE 0.05% CREAM: 0.05 | 14 days supply | Qty: 30 | Fill #0

## 2018-04-09 ENCOUNTER — Encounter: Payer: 59 | Admitting: Family Medicine

## 2018-04-29 DIAGNOSIS — Z6826 Body mass index (BMI) 26.0-26.9, adult: Secondary | ICD-10-CM | POA: Diagnosis not present

## 2018-04-29 DIAGNOSIS — Z01419 Encounter for gynecological examination (general) (routine) without abnormal findings: Secondary | ICD-10-CM | POA: Diagnosis not present

## 2018-04-29 DIAGNOSIS — E559 Vitamin D deficiency, unspecified: Secondary | ICD-10-CM | POA: Diagnosis not present

## 2018-05-07 ENCOUNTER — Encounter: Payer: 59 | Admitting: Family Medicine

## 2018-05-08 ENCOUNTER — Encounter: Payer: 59 | Admitting: Family Medicine

## 2018-05-15 ENCOUNTER — Encounter: Payer: Self-pay | Admitting: Family Medicine

## 2018-05-15 ENCOUNTER — Ambulatory Visit (INDEPENDENT_AMBULATORY_CARE_PROVIDER_SITE_OTHER): Payer: 59 | Admitting: Family Medicine

## 2018-05-15 ENCOUNTER — Other Ambulatory Visit: Payer: Self-pay

## 2018-05-15 VITALS — BP 134/90 | HR 67 | Temp 97.7°F | Ht 62.0 in | Wt 141.0 lb

## 2018-05-15 DIAGNOSIS — Z23 Encounter for immunization: Secondary | ICD-10-CM | POA: Diagnosis not present

## 2018-05-15 DIAGNOSIS — Z13220 Encounter for screening for lipoid disorders: Secondary | ICD-10-CM

## 2018-05-15 DIAGNOSIS — Z0000 Encounter for general adult medical examination without abnormal findings: Secondary | ICD-10-CM

## 2018-05-15 DIAGNOSIS — R030 Elevated blood-pressure reading, without diagnosis of hypertension: Secondary | ICD-10-CM | POA: Diagnosis not present

## 2018-05-15 MED ORDER — ZOSTER VAC RECOMB ADJUVANTED 50 MCG/0.5ML IM SUSR: INTRAMUSCULAR | 1 refills | Status: DC

## 2018-05-15 NOTE — Patient Instructions (Signed)
Great to see you  Keep doing what you are doing!  Monitor your blood pressure let me know if regularly elevated  I will call you if your tests are not good.  Otherwise I will send you a letter.  If you do not hear from me with in 2 weeks please call our office.     Happy Holidays

## 2018-05-15 NOTE — Progress Notes (Signed)
Subjective  Shannon [facility] is a 57 y.o. female is presenting for a check up.  Feels generally well.   Patient reports no  vision/ hearing changes,anorexia, weight change, fever ,adenopathy, persistant / recurrent hoarseness, swallowing issues, chest pain, edema,persistant / recurrent cough, hemoptysis, dyspnea(rest, exertional, paroxysmal nocturnal), gastrointestinal  bleeding (melena, rectal bleeding), abdominal pain, excessive heart burn, GU symptoms(dysuria, hematuria, pyuria, voiding/incontinence  Issues) syncope, focal weakness, severe memory loss, concerning skin lesions, depression, anxiety, abnormal bruising/bleeding, major joint swelling, breast masses or abnormal vaginal bleeding.     ELEVATED BP Has been losing weight with exercise.  Cut back on ibuprofen for her headaches.  Working on stress and wellness and counseling.  BP at gyn office 140/82.  Other wise not checking  Chief Complaint noted Review of Symptoms - see HPI PMH - Smoking status noted.    Objective Vital Signs reviewed BP 134/90   Pulse 67   Temp 97.7 F (36.5 C) (Oral)   Ht 5\' 2"  (1.575 m)   Wt 141 lb (64 kg)   SpO2 99%   BMI 25.79 kg/m  Heart - Regular rate and rhythm.  No murmurs, gallops or rubs.    Lungs:  Normal respiratory effort, chest expands symmetrically. Lungs are clear to auscultation, no crackles or wheezes. Extremities:  No cyanosis, edema, or deformity noted with good range of motion of all major joints.   4 cm smooth mobile nontender lipoma feeling mass anterior R thigh Neck:  No deformities, thyromegaly, masses, or tenderness noted.   Supple with full range of motion without pain.  Assessments/Plans  Prevention - overall her health has improved.  Continue exercise and stress reduction   See after visit summary for details of patient instuctions  Elevated BP without diagnosis of hypertension BP Readings from Last 3 Encounters:  05/15/18 134/90  08/07/17 (!) 172/100  04/05/17 (!) 148/92    At goal today and recently at her gyn.  Most likely due to weight loss, exercise and avoiding NSAIDs.  Continue to monitor.  Check labs.  She is taking a mg supplement

## 2018-05-15 NOTE — Assessment & Plan Note (Signed)
BP Readings from Last 3 Encounters:  05/15/18 134/90  08/07/17 (!) 172/100  04/05/17 (!) 148/92   At goal today and recently at her gyn.  Most likely due to weight loss, exercise and avoiding NSAIDs.  Continue to monitor.  Check labs.  She is taking a mg supplement

## 2018-05-16 ENCOUNTER — Encounter: Payer: Self-pay | Admitting: Family Medicine

## 2018-05-16 LAB — CMP14+EGFR
ALBUMIN: 4.6 g/dL (ref 3.5–5.5)
ALT: 17 IU/L (ref 0–32)
AST: 16 IU/L (ref 0–40)
Albumin/Globulin Ratio: 2.2 (ref 1.2–2.2)
Alkaline Phosphatase: 55 IU/L (ref 39–117)
BUN / CREAT RATIO: 11 (ref 9–23)
BUN: 9 mg/dL (ref 6–24)
Bilirubin Total: 0.6 mg/dL (ref 0.0–1.2)
CALCIUM: 9.6 mg/dL (ref 8.7–10.2)
CO2: 24 mmol/L (ref 20–29)
Chloride: 100 mmol/L (ref 96–106)
Creatinine, Ser: 0.8 mg/dL (ref 0.57–1.00)
GFR, EST AFRICAN AMERICAN: 95 mL/min/{1.73_m2} (ref >59)
GFR, EST NON AFRICAN AMERICAN: 82 mL/min/{1.73_m2} (ref >59)
GLOBULIN, TOTAL: 2.1 g/dL (ref 1.5–4.5)
Glucose: 94 mg/dL (ref 65–99)
Potassium: 4.2 mmol/L (ref 3.5–5.2)
SODIUM: 141 mmol/L (ref 134–144)
TOTAL PROTEIN: 6.7 g/dL (ref 6.0–8.5)

## 2018-05-16 LAB — LIPID PANEL
CHOL/HDL RATIO: 2.2 ratio (ref 0.0–4.4)
Cholesterol, Total: 195 mg/dL (ref 100–199)
HDL: 90 mg/dL (ref >39)
LDL Calculated: 93 mg/dL (ref 0–99)
Triglycerides: 58 mg/dL (ref 0–149)
VLDL CHOLESTEROL CAL: 12 mg/dL (ref 5–40)

## 2018-05-16 LAB — MAGNESIUM: Magnesium: 2.2 mg/dL (ref 1.6–2.3)

## 2018-05-16 LAB — CBC
HEMATOCRIT: 40.8 % (ref 34.0–46.6)
Hemoglobin: 13.7 g/dL (ref 11.1–15.9)
MCH: 30.7 pg (ref 26.6–33.0)
MCHC: 33.6 g/dL (ref 31.5–35.7)
MCV: 92 fL (ref 79–97)
Platelets: 300 10*3/uL (ref 150–450)
RBC: 4.46 x10E6/uL (ref 3.77–5.28)
RDW: 12 % — AB (ref 12.3–15.4)
WBC: 5.1 10*3/uL (ref 3.4–10.8)

## 2018-06-06 ENCOUNTER — Encounter: Payer: Self-pay | Admitting: Family Medicine

## 2018-07-26 DIAGNOSIS — H524 Presbyopia: Secondary | ICD-10-CM | POA: Diagnosis not present

## 2018-07-26 DIAGNOSIS — H5213 Myopia, bilateral: Secondary | ICD-10-CM | POA: Diagnosis not present

## 2018-07-26 DIAGNOSIS — H52203 Unspecified astigmatism, bilateral: Secondary | ICD-10-CM | POA: Diagnosis not present

## 2018-08-21 DIAGNOSIS — H5310 Unspecified subjective visual disturbances: Secondary | ICD-10-CM | POA: Diagnosis not present

## 2018-08-21 DIAGNOSIS — H04122 Dry eye syndrome of left lacrimal gland: Secondary | ICD-10-CM | POA: Diagnosis not present

## 2018-08-21 DIAGNOSIS — H5712 Ocular pain, left eye: Secondary | ICD-10-CM | POA: Diagnosis not present

## 2018-08-21 DIAGNOSIS — R51 Headache: Secondary | ICD-10-CM | POA: Diagnosis not present

## 2018-08-21 DIAGNOSIS — H16102 Unspecified superficial keratitis, left eye: Secondary | ICD-10-CM | POA: Diagnosis not present

## 2018-08-26 ENCOUNTER — Other Ambulatory Visit: Payer: Self-pay | Admitting: Family Medicine

## 2018-08-26 DIAGNOSIS — Z1231 Encounter for screening mammogram for malignant neoplasm of breast: Secondary | ICD-10-CM

## 2018-10-07 DIAGNOSIS — L239 Allergic contact dermatitis, unspecified cause: Secondary | ICD-10-CM | POA: Diagnosis not present

## 2018-10-15 ENCOUNTER — Other Ambulatory Visit: Payer: Self-pay

## 2018-10-15 ENCOUNTER — Ambulatory Visit
Admission: RE | Admit: 2018-10-15 | Discharge: 2018-10-15 | Disposition: A | Discharge status: Home / Self Care | Payer: 59 | Source: Ambulatory Visit | Attending: Family Medicine | Admitting: Family Medicine

## 2018-10-15 DIAGNOSIS — Z1231 Encounter for screening mammogram for malignant neoplasm of breast: Secondary | ICD-10-CM | POA: Diagnosis not present

## 2018-10-15 IMAGING — MG DIGITAL SCREENING BILATERAL MAMMOGRAM WITH TOMO AND CAD
8 series · 8 of 24 positions shown · non-contrast
Comparison: Previous exam(s).

CLINICAL DATA: Screening.

EXAM:
DIGITAL SCREENING BILATERAL MAMMOGRAM WITH TOMO AND CAD

[R CC synth-2D]
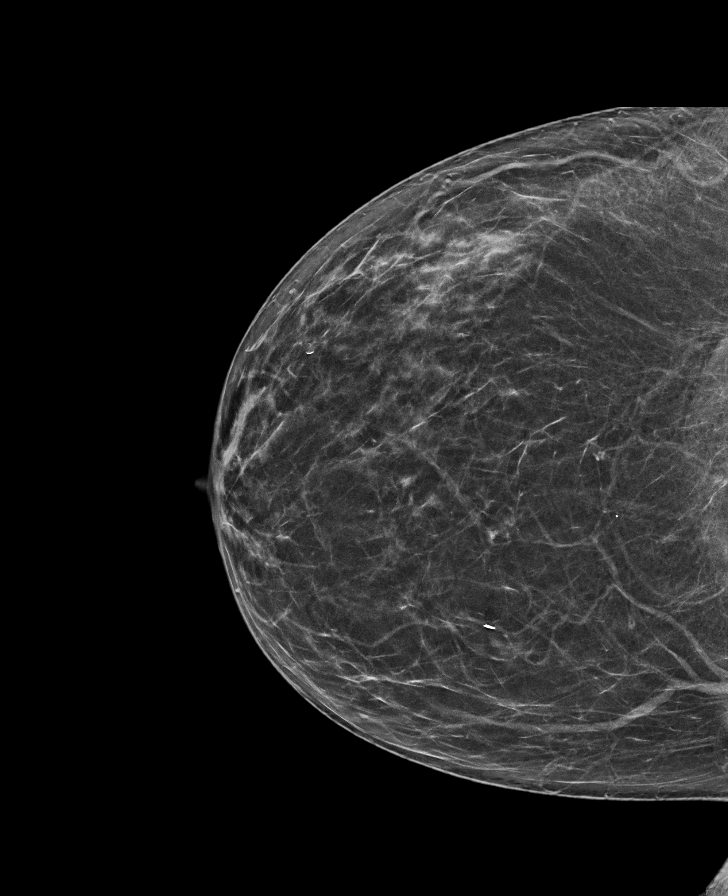

[L CC synth-2D]
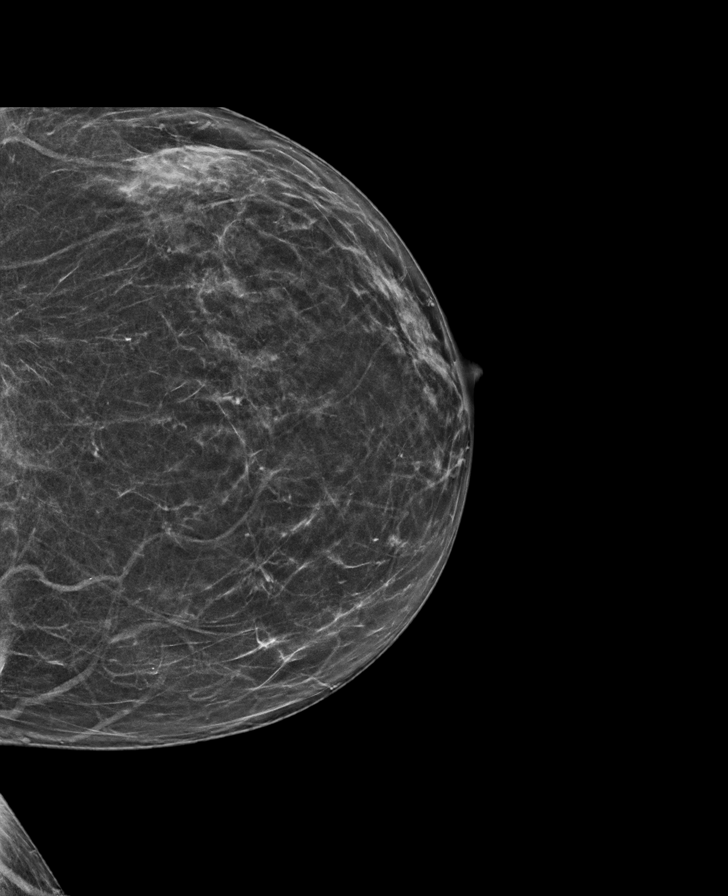

[L MLO synth-2D]
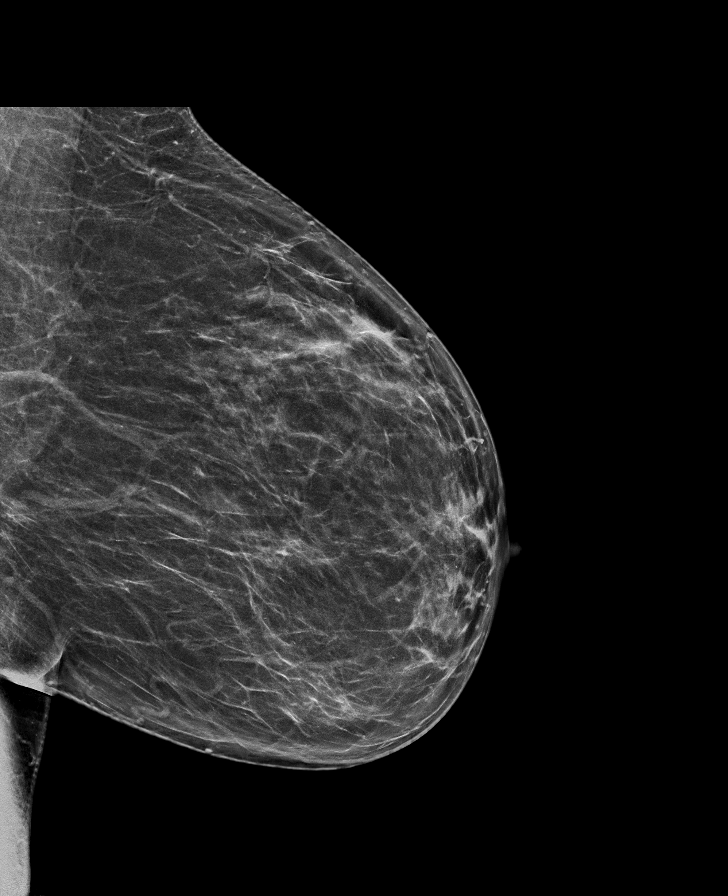

[R MLO synth-2D]
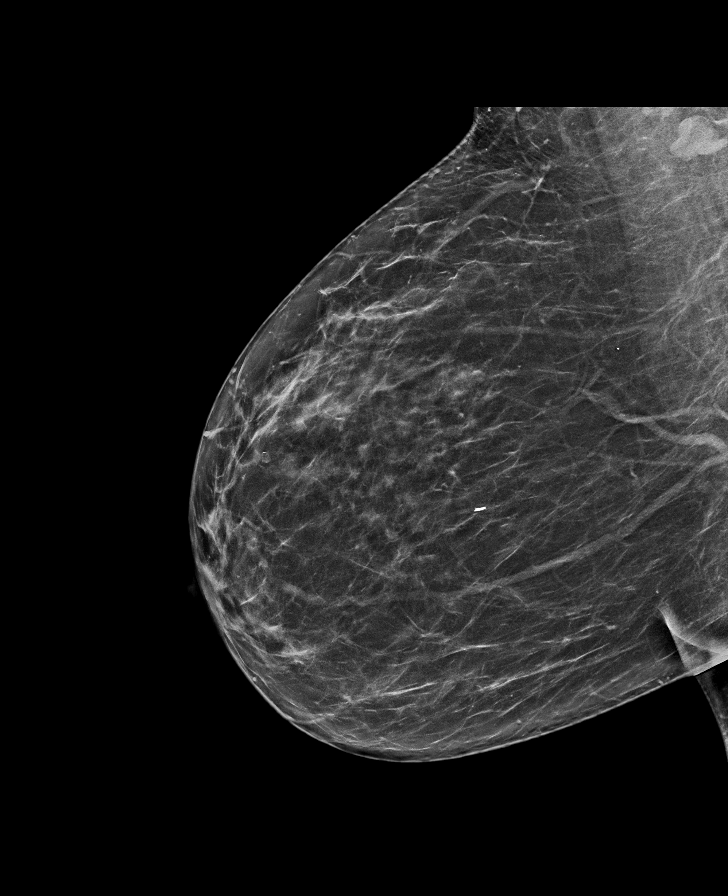

[R CC tomo · tomo slice 33/65.0]
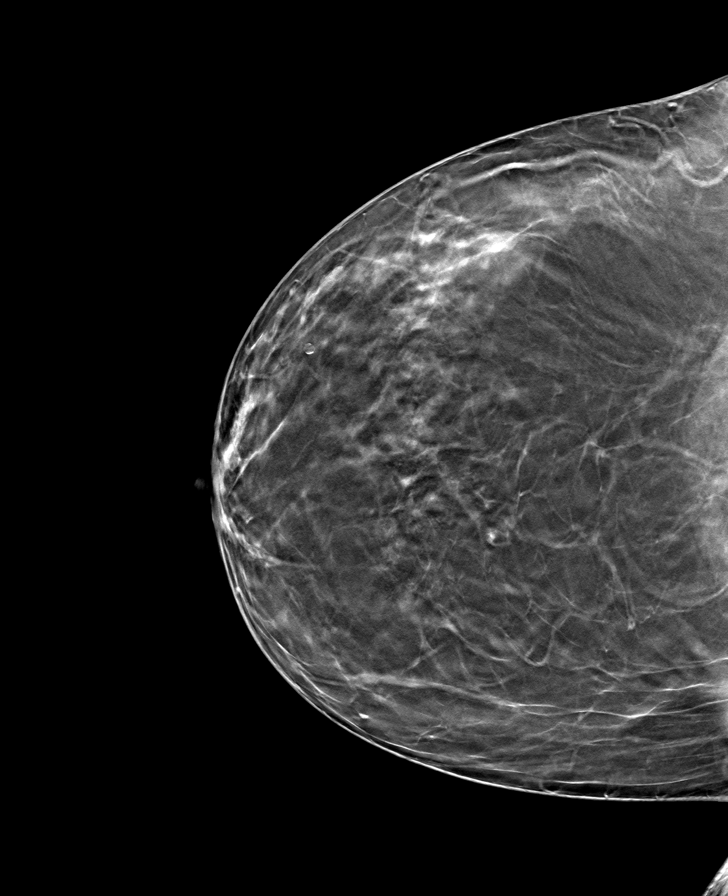

[L MLO tomo · tomo slice 35/69.0]
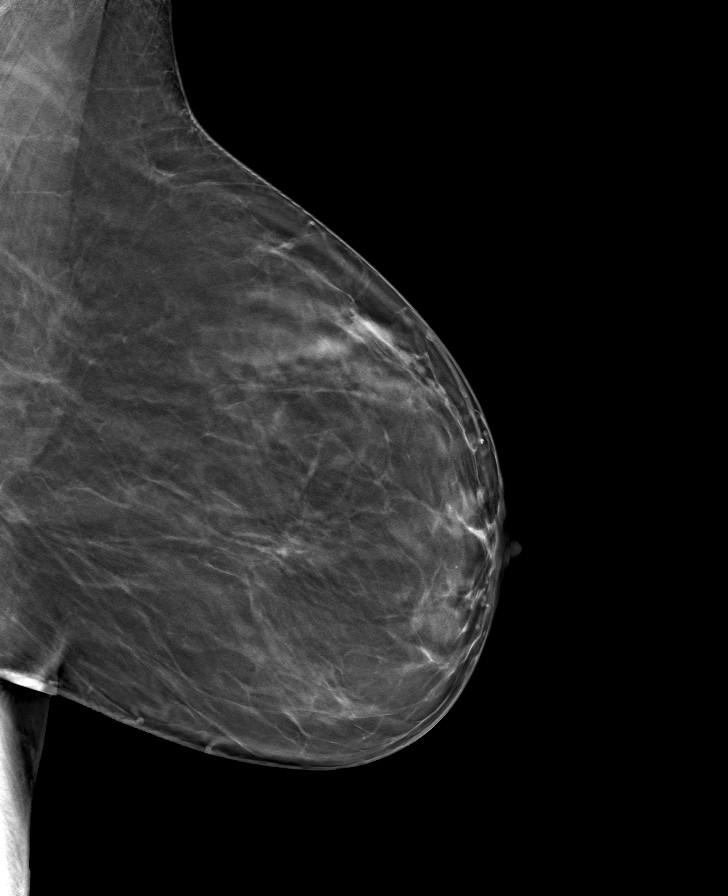

[L CC tomo · tomo slice 31/62.0]
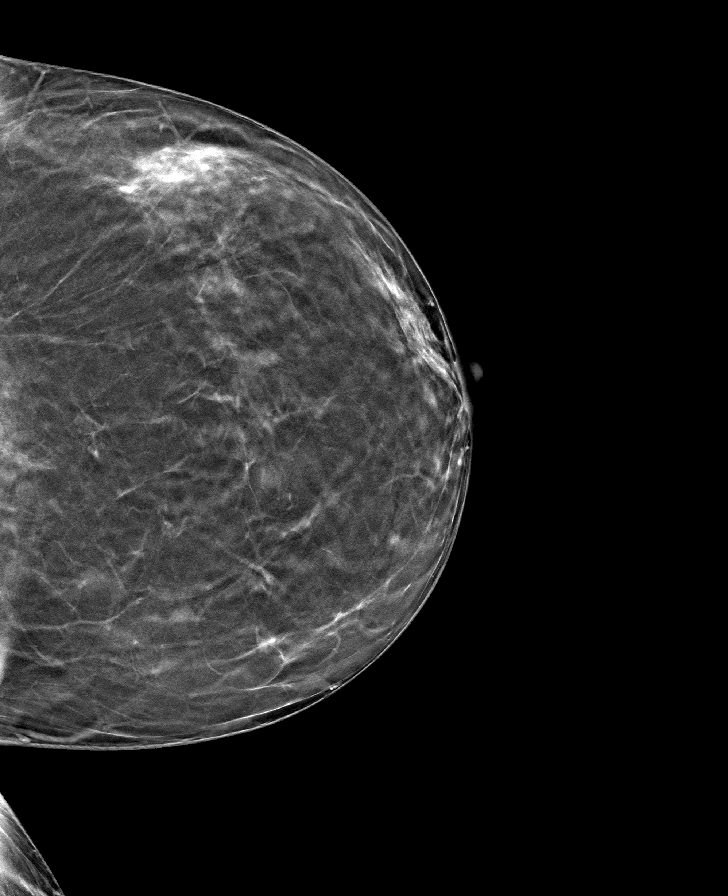

[R MLO tomo · tomo slice 37/73.0]
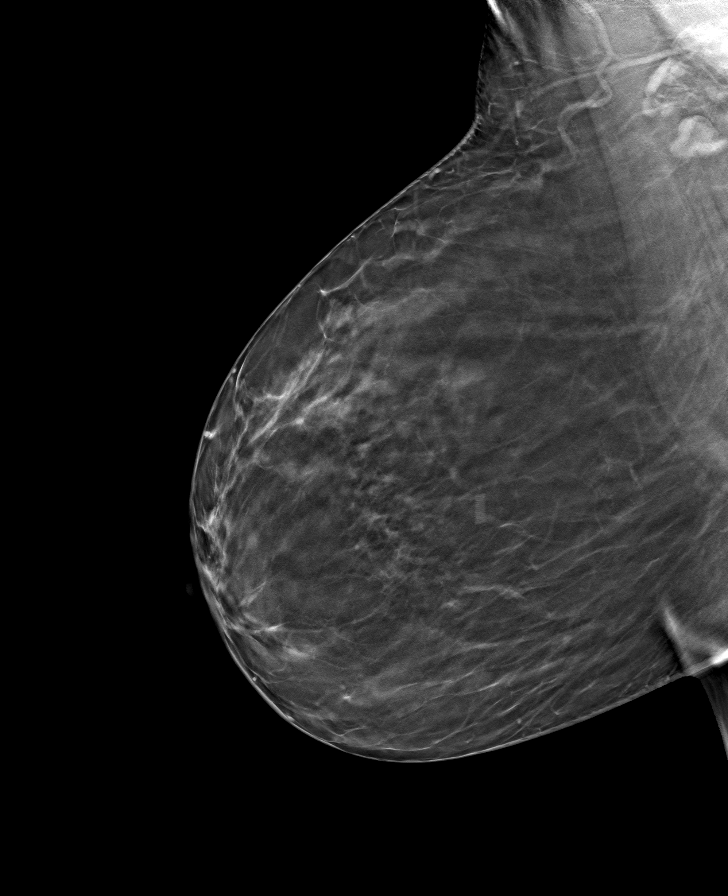

[8 of 24 positions shown; findings below may reference images not displayed]

ACR Breast Density Category b: There are scattered areas of
fibroglandular density.
FINDINGS: There are no findings suspicious for malignancy. Images were
processed with CAD.
IMPRESSION: No mammographic evidence of malignancy. A result letter of this
screening mammogram will be mailed directly to the patient.

RECOMMENDATION:
Screening mammogram in one year. (Code:[TQ])

BI-RADS CATEGORY  1: Negative.

## 2018-12-05 DIAGNOSIS — D225 Melanocytic nevi of trunk: Secondary | ICD-10-CM | POA: Diagnosis not present

## 2018-12-05 DIAGNOSIS — L821 Other seborrheic keratosis: Secondary | ICD-10-CM | POA: Diagnosis not present

## 2018-12-05 DIAGNOSIS — L570 Actinic keratosis: Secondary | ICD-10-CM | POA: Diagnosis not present

## 2018-12-05 DIAGNOSIS — L814 Other melanin hyperpigmentation: Secondary | ICD-10-CM | POA: Diagnosis not present

## 2018-12-05 DIAGNOSIS — D2272 Melanocytic nevi of left lower limb, including hip: Secondary | ICD-10-CM | POA: Diagnosis not present

## 2018-12-05 DIAGNOSIS — D2271 Melanocytic nevi of right lower limb, including hip: Secondary | ICD-10-CM | POA: Diagnosis not present

## 2019-02-24 DIAGNOSIS — Z13820 Encounter for screening for osteoporosis: Secondary | ICD-10-CM | POA: Diagnosis not present

## 2019-03-26 DIAGNOSIS — N9089 Other specified noninflammatory disorders of vulva and perineum: Secondary | ICD-10-CM | POA: Diagnosis not present

## 2019-04-02 DIAGNOSIS — L570 Actinic keratosis: Secondary | ICD-10-CM | POA: Diagnosis not present

## 2019-04-22 DIAGNOSIS — H61002 Unspecified perichondritis of left external ear: Secondary | ICD-10-CM | POA: Diagnosis not present

## 2019-05-13 DIAGNOSIS — Z01419 Encounter for gynecological examination (general) (routine) without abnormal findings: Secondary | ICD-10-CM | POA: Diagnosis not present

## 2019-05-13 DIAGNOSIS — Z6826 Body mass index (BMI) 26.0-26.9, adult: Secondary | ICD-10-CM | POA: Diagnosis not present

## 2019-05-13 DIAGNOSIS — E559 Vitamin D deficiency, unspecified: Secondary | ICD-10-CM | POA: Diagnosis not present

## 2019-06-10 ENCOUNTER — Encounter: Payer: 59 | Admitting: Family Medicine

## 2019-06-30 ENCOUNTER — Ambulatory Visit (INDEPENDENT_AMBULATORY_CARE_PROVIDER_SITE_OTHER): Payer: 59 | Admitting: Family Medicine

## 2019-06-30 ENCOUNTER — Other Ambulatory Visit: Payer: Self-pay

## 2019-06-30 VITALS — BP 164/92 | HR 109 | Wt 142.8 lb

## 2019-06-30 DIAGNOSIS — K5792 Diverticulitis of intestine, part unspecified, without perforation or abscess without bleeding: Secondary | ICD-10-CM | POA: Insufficient documentation

## 2019-06-30 DIAGNOSIS — R1032 Left lower quadrant pain: Secondary | ICD-10-CM

## 2019-06-30 DIAGNOSIS — Z8719 Personal history of other diseases of the digestive system: Secondary | ICD-10-CM

## 2019-06-30 HISTORY — DX: Personal history of other diseases of the digestive system: Z87.19

## 2019-06-30 NOTE — Patient Instructions (Addendum)
It was nice meeting you today Ms. Shannon [facility]!  Today we are getting some labs and scheduling a CT scan of your abdomen to further investigate your pain.  You can continue to eat and drink as normal.  I hope that this gives [place] some answers and we will update you as soon as we get the results.  I hope that you feel better soon.  If you have any questions or concerns, please feel free to call the clinic.   Be well,  Dr. Shan Levans

## 2019-06-30 NOTE — Progress Notes (Signed)
   Subjective:    Shannon [facility] - 59 y.o. female MRN [id number]  Date of birth: [birth year]  CC:  Shannon [facility] is here for left lower quadrant pain.  HPI: LLQ pain Started 7am yesterday Hurts on standing, not painful when seated or lying flat Hurts when taking a step with left foot Has had four hernia surgeries in this area with mesh (two inguinal, one epigastric, and on umbilical) Two c-sections, tubal ligation Regular bowel movements Has stayed well hydrated but has not eaten anything today No nausea  No bloody or black bowel movements Has not experienced this type of pain before Has not felt any bulging No fevers, chills, feeling well otherwise No injuries to left hip or lower back Has had flatulence this morning and yesterday, has taken simethicone without any change Took ibuprofen 600 mg last night without relief Sharp, squeezing in quality, no radiation, mostly constant although exacerbated by above factors Has been eating fruits and vegetables recently, had a loose bowel movement yesterday morning LMP was over 1 year ago Not sexually active No changes in urination   Health Maintenance:  Health Maintenance Due  Topic Date Due  . DTAP VACCINES (1) 04/12/1961  . INFLUENZA VACCINE  12/28/2018    -  reports that she has never smoked. She has never used smokeless tobacco. - Review of Systems: Per HPI. - Past Medical History: Patient Active Problem List   Diagnosis Date Noted  . LLQ pain 06/30/2019  . Chondromalacia, patella 02/17/2016  . Palpitations 01/26/2016  . Headache, chronic daily 11/12/2014  . Elevated BP without diagnosis of hypertension 03/25/2012  . Foot pain 08/30/2007  . HALLUX RIGIDUS, ACQUIRED 08/30/2007  . UNEQUAL LEG LENGTH 08/30/2007  . THORACOGENIC SCOLIOSIS 08/30/2007  . OBESITY, UNSPECIFIED 07/11/2007   - Medications: reviewed and updated   Objective:   Physical Exam BP (!) 164/92   Pulse (!) 109   Wt 142 lb 12.8 oz (64.8 kg)   LMP  12/27/2016   SpO2 99%   BMI 26.12 kg/m  Gen: NAD, alert, cooperative with exam, well-appearing CV: Mildly tachycardic, regular rhythm, good S1/S2, no murmur Resp: CTABL, no wheezes, non-labored Abd: Well-healed scars visualized from prior surgeries, normal bowel sounds, soft throughout abdomen except slightly more firm in LLQ, no hernias palpated tender to palpation in the LLQ without rebound tenderness, nontender elsewhere, no CVA or suprapubic tenderness     Assessment & Plan:   LLQ pain Differential includes constipation, diverticulitis, ovarian torsion.  Unlikely to be diverticulitis since patient is feels well overall and has not been febrile.  Will obtain CBC and BMP today.  We will also order CT abdomen/pelvis with contrast to further evaluate for GI or gynecological abnormalities.    Maia Breslow, M.D. 06/30/2019, 3:03 PM PGY-3, [place]

## 2019-06-30 NOTE — Assessment & Plan Note (Signed)
Differential includes constipation, diverticulitis, ovarian torsion.  Unlikely to be diverticulitis since patient is feels well overall and has not been febrile.  Will obtain CBC and BMP today.  We will also order CT abdomen/pelvis with contrast to further evaluate for GI or gynecological abnormalities.

## 2019-07-01 ENCOUNTER — Telehealth: Payer: Self-pay | Admitting: Family Medicine

## 2019-07-01 LAB — BASIC METABOLIC PANEL
BUN/Creatinine Ratio: 10 (ref 9–23)
BUN: 8 mg/dL (ref 6–24)
CO2: 21 mmol/L (ref 20–29)
Calcium: 9.7 mg/dL (ref 8.7–10.2)
Chloride: 98 mmol/L (ref 96–106)
Creatinine, Ser: 0.79 mg/dL (ref 0.57–1.00)
GFR calc Af Amer: 95 mL/min/{1.73_m2} (ref >59)
GFR calc non Af Amer: 83 mL/min/{1.73_m2} (ref >59)
Glucose: 88 mg/dL (ref 65–99)
Potassium: 4.3 mmol/L (ref 3.5–5.2)
Sodium: 140 mmol/L (ref 134–144)

## 2019-07-01 LAB — CBC
Hematocrit: 43.2 % (ref 34.0–46.6)
Hemoglobin: 14.9 g/dL (ref 11.1–15.9)
MCH: 30.9 pg (ref 26.6–33.0)
MCHC: 34.5 g/dL (ref 31.5–35.7)
MCV: 90 fL (ref 79–97)
Platelets: 285 10*3/uL (ref 150–450)
RBC: 4.82 x10E6/uL (ref 3.77–5.28)
RDW: 11.8 % (ref 11.7–15.4)
WBC: 12.6 10*3/uL — ABNORMAL HIGH (ref 3.4–10.8)

## 2019-07-01 NOTE — Telephone Encounter (Signed)
Called Shannon [facility] to check on her abdominal pain and to inform her of her CBC and BMP results.  She says that her abdominal pain is about the same, although she is tolerating food and fluids.  Her CT scan is scheduled for Thursday.  I informed her that her CBC was notable for a WBC of 12.6, which could indicate infection or inflammation.  Her BMP was within normal limits.  She was encouraged to call the clinic if she has any change or worsening in her symptoms and that I will be watching for her CT scan result.

## 2019-07-02 ENCOUNTER — Other Ambulatory Visit: Payer: Self-pay

## 2019-07-02 ENCOUNTER — Telehealth: Payer: Self-pay | Admitting: Family Medicine

## 2019-07-02 ENCOUNTER — Ambulatory Visit (HOSPITAL_COMMUNITY)
Admission: RE | Admit: 2019-07-02 | Discharge: 2019-07-02 | Disposition: A | Discharge status: Home / Self Care | Payer: 59 | Source: Ambulatory Visit | Attending: Family Medicine | Admitting: Family Medicine

## 2019-07-02 ENCOUNTER — Telehealth: Payer: Self-pay

## 2019-07-02 DIAGNOSIS — K5732 Diverticulitis of large intestine without perforation or abscess without bleeding: Secondary | ICD-10-CM | POA: Diagnosis not present

## 2019-07-02 DIAGNOSIS — R1032 Left lower quadrant pain: Secondary | ICD-10-CM | POA: Diagnosis not present

## 2019-07-02 IMAGING — CT CT ABD-PELV W/ CM
2 of 5 series · 16 of 46 positions shown, 18 images · IV contrast (omnipaque)
Comparison: [DATE]

CLINICAL DATA: 4 day history of left lower quadrant pain.

EXAM:
CT ABDOMEN AND PELVIS WITH CONTRAST
TECHNIQUE: Multidetector CT imaging of the abdomen and pelvis was performed
using the standard protocol following bolus administration of
intravenous contrast.
CONTRAST:  100mL OMNIPAQUE IOHEXOL 300 MG/ML  SOLN

[Series 3: a/p w/ 5mm · axial · 0.72mm/px · z∈[+800,+1160]mm · 13 of 82 slices shown, 15 images]
[im 5/82  soft-tissue]
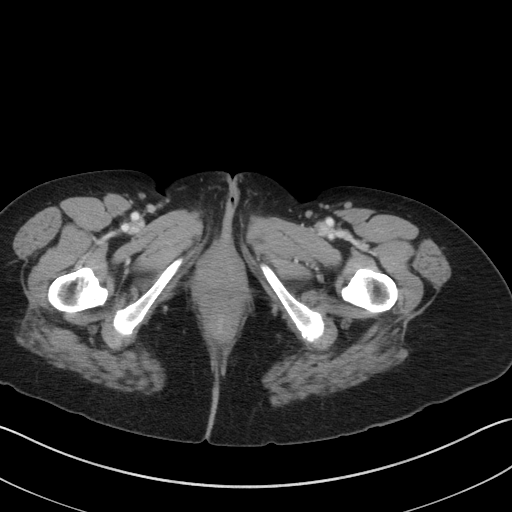
[im 5/82  bone]
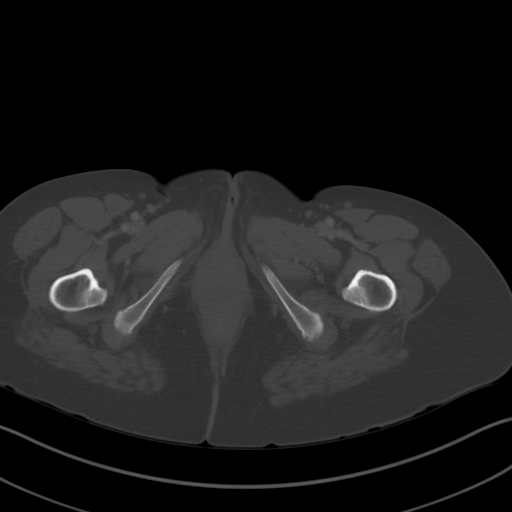
[im 13/82  soft-tissue]
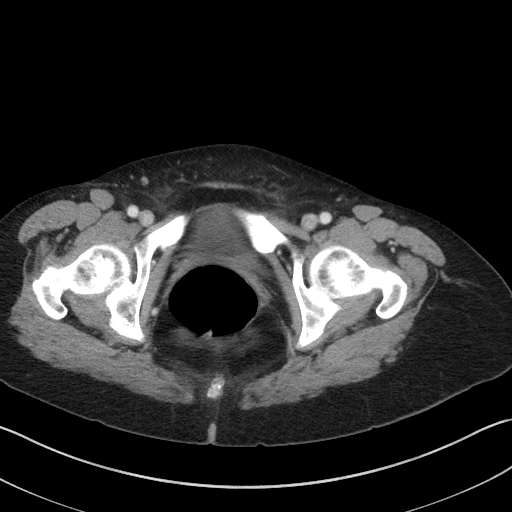
[im 17/82  soft-tissue]
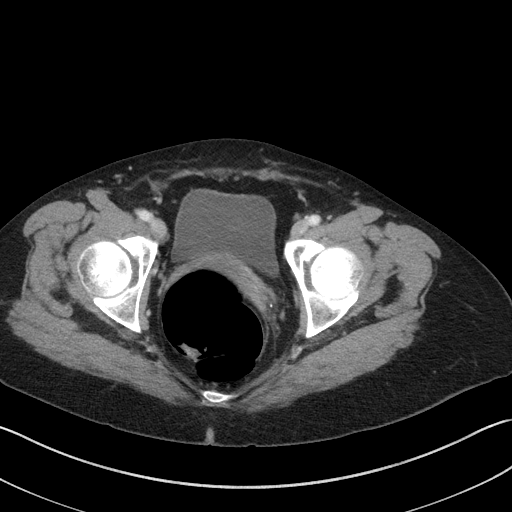
[im 25/82  soft-tissue]
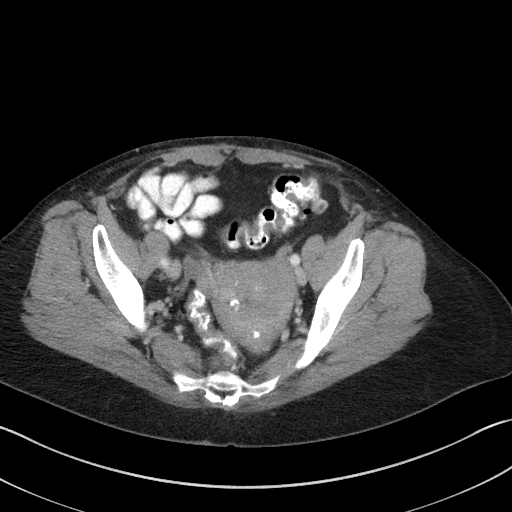
[im 29/82  soft-tissue]
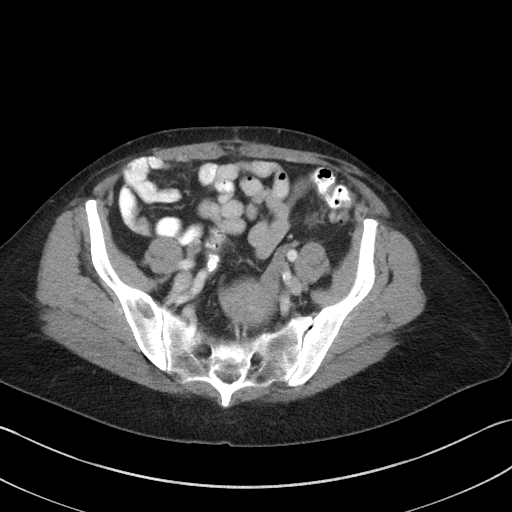
[im 37/82  soft-tissue]
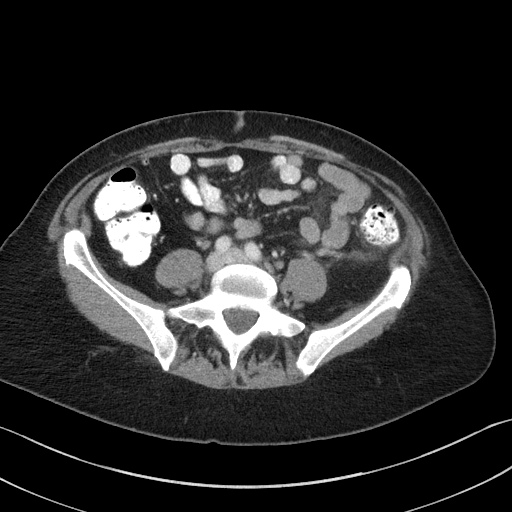
[im 41/82  soft-tissue]
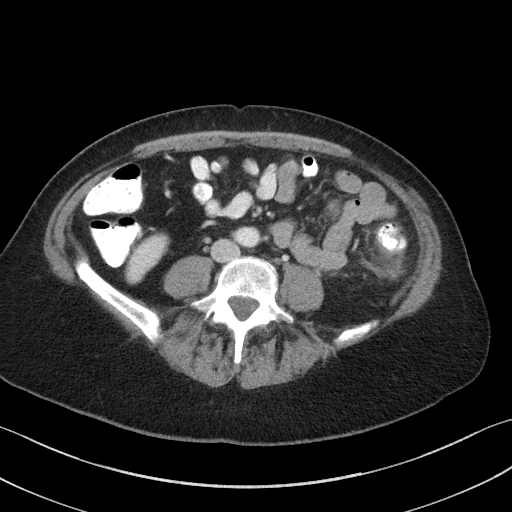
[im 45/82  soft-tissue]
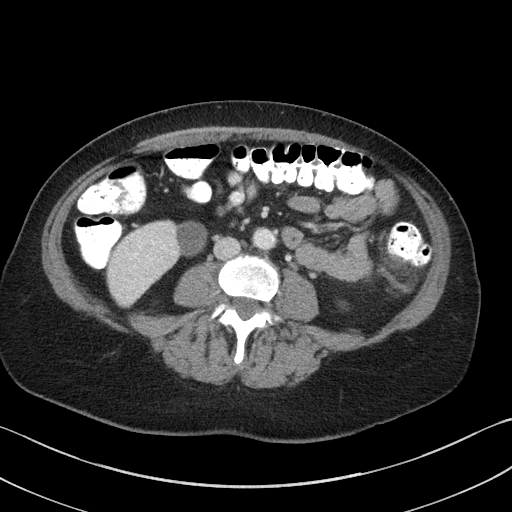
[im 53/82  soft-tissue]
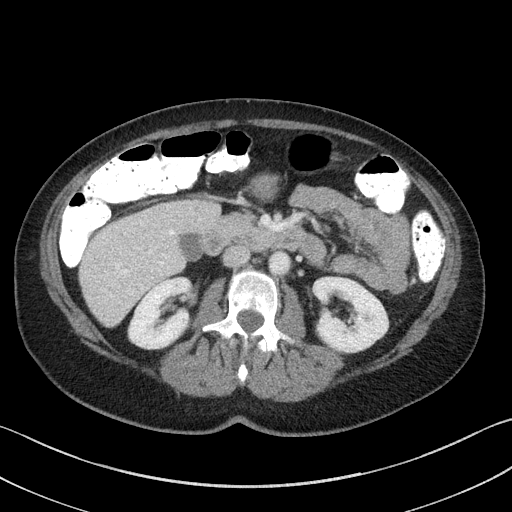
[im 53/82  bone]
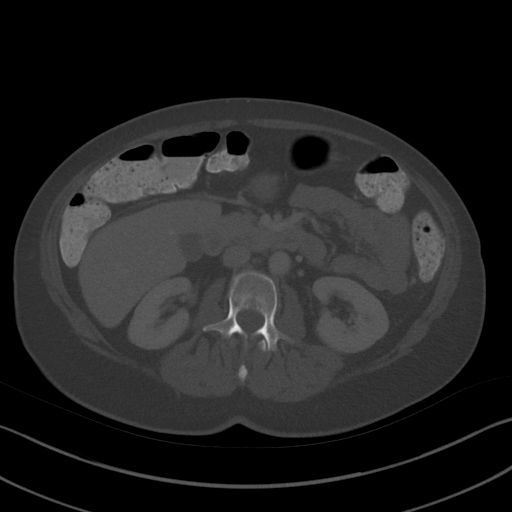
[im 57/82  soft-tissue]
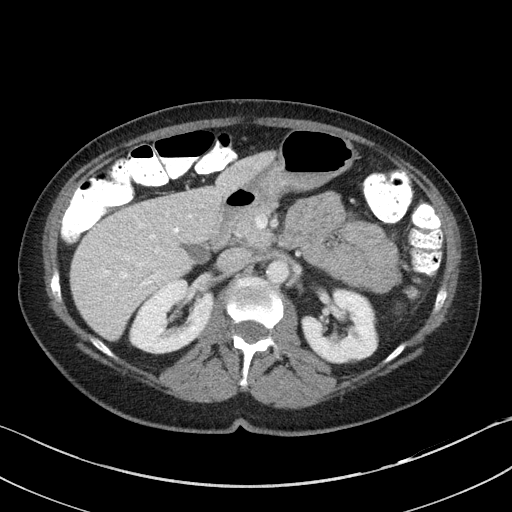
[im 65/82  soft-tissue]
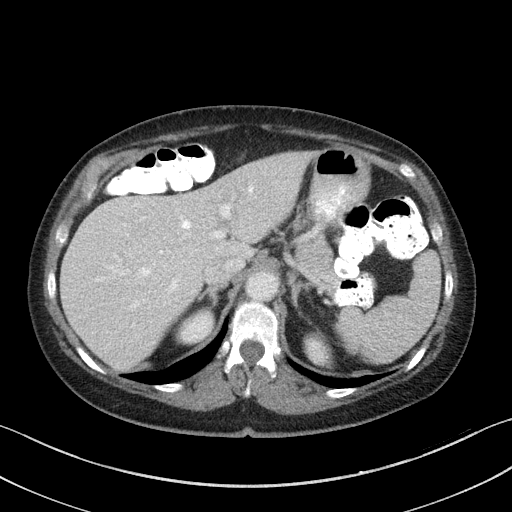
[im 69/82  soft-tissue]
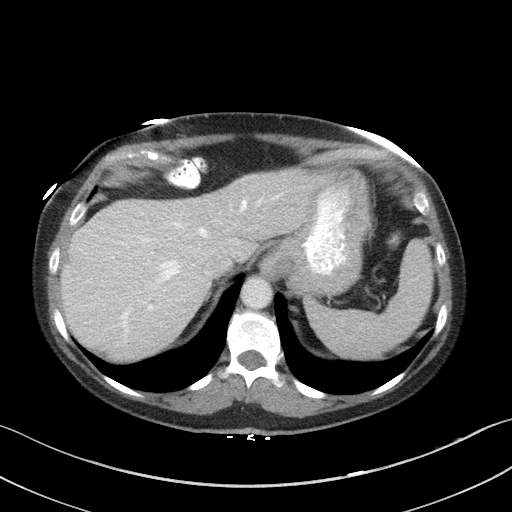
[im 77/82  soft-tissue]
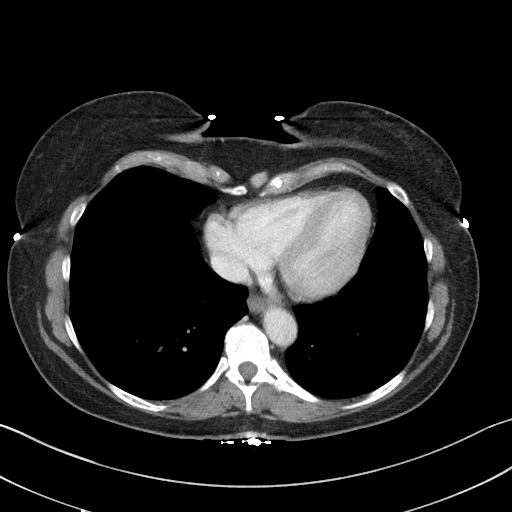

[Series 6: a/p w/ cor · coronal · 0.72mm/px · 3 of 131 slices shown]
[im 44/131  soft-tissue]
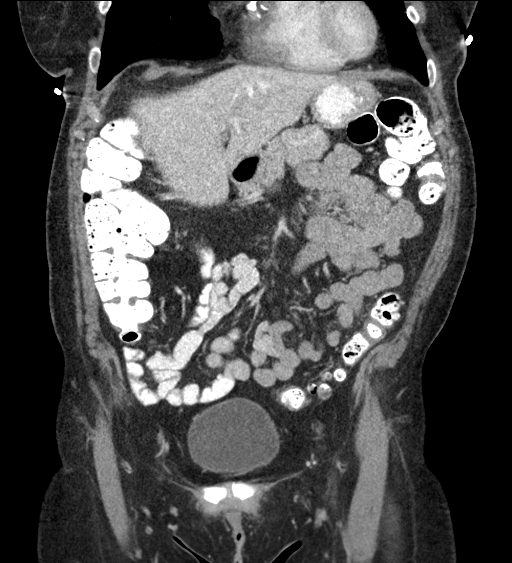
[im 58/131  soft-tissue]
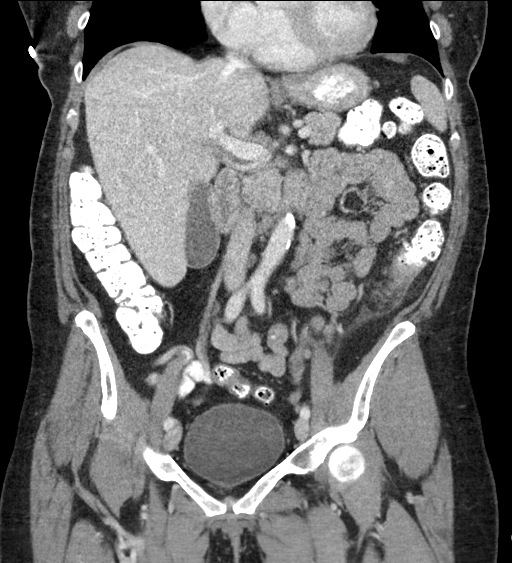
[im 73/131  soft-tissue]
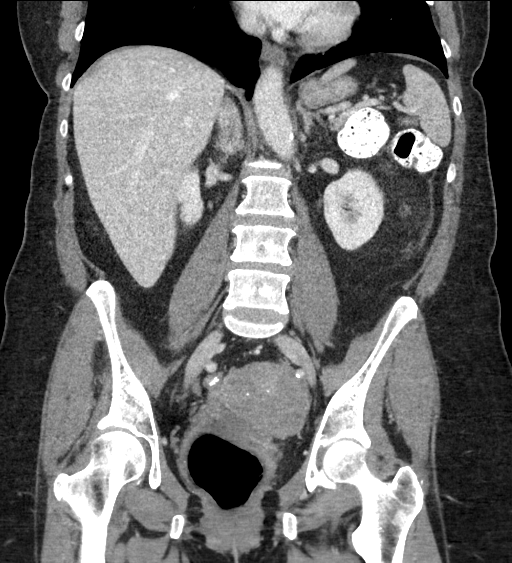

[16 of 46 positions shown; findings below may reference images not displayed]

FINDINGS: Lower chest: Unremarkable

Hepatobiliary: No suspicious focal abnormality within the liver
parenchyma. There is no evidence for gallstones, gallbladder wall
thickening, or pericholecystic fluid. No intrahepatic or
extrahepatic biliary dilation.

Pancreas: No focal mass lesion. No dilatation of the main duct. No
intraparenchymal cyst. No peripancreatic edema.

Spleen: No splenomegaly. No focal mass lesion.

Adrenals/Urinary Tract: No adrenal nodule or mass. 5 mm low-density
lesion interpolar right kidney is too small to characterize but this
was present on the previous exam consistent with benign etiology.
Left kidney unremarkable. No evidence for hydroureter. The urinary
bladder appears normal for the degree of distention.

Stomach/Bowel: Tiny hiatal hernia.Stomach is otherwise unremarkable.
No gastric wall thickening. No evidence of outlet obstruction.
Duodenum is normally positioned as is the ligament of Treitz. No
small bowel wall thickening. No small bowel dilatation. Cecal tip is
high in the right abdomen. The terminal ileum is normal. The
appendix is normal. There is a posterior inflamed diverticulum along
the mid descending colon (axial 40/series 3) with periappendiceal
edema/inflammation. No CT findings of perforation or abscess. Mild
diverticular changes are noted relatively diffusely in the left
colon.

Vascular/Lymphatic: There is abdominal aortic atherosclerosis
without aneurysm. There is no gastrohepatic or hepatoduodenal
ligament lymphadenopathy. No retroperitoneal or mesenteric
lymphadenopathy. No pelvic sidewall lymphadenopathy.

Reproductive: Calcified uterine fibroids noted. There is no adnexal
mass.

Other: No intraperitoneal free fluid.

Musculoskeletal: No worrisome lytic or sclerotic osseous
abnormality.
IMPRESSION: 1. Diverticulitis of the mid descending colon. No evidence for
perforation or abscess.
2. Tiny hiatal hernia.
3.  Aortic Atherosclerois ([AE]-170.0)

These results will be called to the ordering clinician or
representative by the Radiologist Assistant, and communication
documented in the PACS or zVision Dashboard.

## 2019-07-02 MED ORDER — IOHEXOL 300 MG/ML  SOLN
100.0000 mL | Freq: Once | INTRAMUSCULAR | Status: AC | PRN
  Administered 2019-07-02: 100 mL via INTRAVENOUS

## 2019-07-02 NOTE — Telephone Encounter (Signed)
Dianne with [facility] calls nursing line to leave report of CT results.   Please see below:  IMPRESSION: 1. Diverticulitis of the mid descending colon. No evidence for perforation or abscess. 2. Tiny hiatal hernia. 3.  Aortic Atherosclerois (ICD10-170.0)  To PCP  Talbot Grumbling, RN

## 2019-07-02 NOTE — Telephone Encounter (Signed)
Spoke with her and emailed ct findings She would like to wait and see how feels in the next few days before decding on antibiotics She will be in touch

## 2019-07-03 ENCOUNTER — Ambulatory Visit (HOSPITAL_COMMUNITY): Payer: 59

## 2019-07-04 MED ORDER — CIPROFLOXACIN HCL 500 MG PO TABS: 500.0000 mg | ORAL_TABLET | Freq: Two times a day (BID) | ORAL | 0 refills | Status: DC

## 2019-07-04 MED ORDER — METRONIDAZOLE 500 MG PO TABS: 500.0000 mg | ORAL_TABLET | Freq: Three times a day (TID) | ORAL | 0 refills | Status: DC

## 2019-07-04 MED FILL — metroNIDAZOLE 500 MG TABS: 500 | 7 days supply | Qty: 21 | Fill #0

## 2019-07-04 MED FILL — CIPROFLOXACIN HCL 500 MG TA: 500 | 7 days supply | Qty: 14 | Fill #0

## 2019-07-04 NOTE — Addendum Note (Signed)
Addended by: Talbert Cage L on: 07/04/2019 10:48 AM   Modules accepted: Orders

## 2019-07-04 NOTE — Telephone Encounter (Signed)
From [facility],  I wanted to check on going ahead and picking up antibiotics before the weekend, preferable from the [place] today, if okay with you. I was able to get an appt. w/ Jeannie on Monday to get updates on diet management.  Thanks greatly! ~Leonora  From: Nikki Dom [email or web address]>  Sent: Thursday, July 03, 2019 5:00 PM To: Azzie Roup @[email or web address]> Subject: Re: Problem  Kriste Basque,  Thanks again for the call report yesterday. I ate a more regular dinner last night of baked chicken, brown rice and a salad w/ just mixed greens, [place] & tomatoes. I slept fine and stable until woke up I severe pain this AM. I had two stool after an hour or so and things have eased off. I am now having New RLQ pain this hour, so glad I know my appendix is clear.  No fever or nausea.   I think I should get the Rx for the weekend but still am unsure whether to take it yet.   My stepfather passed away during the night on the exact 20th anniversary If my Mother passing away in [place].  I am a bit overwhelmed. I guess I should not drink any alcohol w/ this, where I have been abstaining to be safe.  Rose's BD is Sunday too w/ 1st time not being w/her.   That's the latest.... Thanks for everything! ~Channing  Get Outlook for iOS  From: Alauni, Pariseau [email or web address]> Sent: Monday, June 30, 2019 10:33:16 PM To: Azzie Roup [email or web address]> Subject: Re: Problem    Thanks for checking in [place]. I am the same and glad to be stable for now.  Appreciate your connecting w/ Dr. Evie Lacks today.   My CT is Thurs. at 4p at [facility]. I am going to call the scheduler to see if they have a change and can fit me In sooner the next couple days. I am hoping to get those results before the weekend if at all possible.   I had fasted since noon Sun. To be safe and finally ate some tonight without problem or change so far.   I  appreciate all the support to see me soon today.   Best, ~Keatyn  Get Outlook for iOS  From: Azzie Roup [email or web address]> Sent: Monday, June 30, 2019 7:45:15 PM To: Bingley, Analee @[email or web address]> Subject: Re: Problem    Hope you are better or at least stable.  Looks like you had some blood drawn and will be getting a CT.   Don't see any results yet.   From: Nikki Dom [email or web address]> Sent: Monday, June 30, 2019 9:48 AM To: Azzie Roup @[email or web address]> Subject: Re: Problem    Ok. Thanks. Will update you how it goes. I have not eaten since yesterday at noon but drinking regularly. & just sipping water when thirsty today so far in case helpful to any evaluation.   Best, ~Sarely  Get Outlook for iOS  From: Azzie Roup @[email or web address]> Sent: Monday, June 30, 2019 8:51:32 AM To: Nikki Dom @[email or web address] .[email or web address]> Subject: Re: Problem    Oh good she is one of our third year's. Certainly would trust her w one of my family    Get Outlook for iOS  From: Alexondra, Gamby @[email or web address]> Sent: Monday, June 30, 2019 8:43:54 AM To: Azzie Roup @[email or web address]> Subject: Re: Problem    FYI- I have a 1:50p appt w/ Dr. Evie Lacks.  Is that a he or she?  Catering manager for iOS  From:  Ramer, Brittanny [email or web address]> Sent: Monday, June 30, 2019 8:12:13 AM To: Azzie Roup @[email or web address]> Subject: Re: Problem    Thanks Truman Hayward for getting back to me.  No nausea.   Will call to get an Appt. Do they need you to request me seeing faculty vs. a work-in appt w/ resident?  ~Jazzalyn  Get Outlook for iOS  From: Azzie Roup @[email or web address]> Sent: Monday, June 30, 2019 7:50:55 AM To: Nikki Dom @[place] .[email or web address]> Subject: RE: Problem    Sorry to hear this.   Any nausea?   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   You can try the front office when we open and see if any faculty are available.      Certainly if you get fever or its getting worse you need to be seen ASAP   Take care   Truman Hayward     From: Nikki Dom @[email or web address] .[email or web address]>  Sent: Monday, June 30, 2019 6:08 AM To: Azzie Roup @[email or web address]> Subject: Problem   Hi Lee,  [facility] all is well w/ you.    Yesterday, I woke up w/ sharp pain in my abdomen left lower quadrant around 7 am.  It has not changed or subsided. It is tender to pressure if I palpate it. My belly is soft and I am  Not constipated. No fever.   I worked out a week ago on [employer] and no problems noted so do not think I pulled anything.  I do have a history of four hernia repairs w/ the last being a double inguinal on that same side w/ 6"x6" mesh on that same side, but this is higher up than that I think.    I am unsure what to do and would like to see either you or a faculty member to evaluate it today or tomorrow.    Thanks for your guidance on follow-up.   ~Epsie   Get Outlook for [employer]

## 2019-07-04 NOTE — Addendum Note (Signed)
Addended by: Talbert Cage L on: 07/04/2019 11:08 AM   Modules accepted: Orders

## 2019-07-04 NOTE — Telephone Encounter (Signed)
Will treat with cipro flagyl

## 2019-07-07 ENCOUNTER — Other Ambulatory Visit: Payer: Self-pay

## 2019-07-07 ENCOUNTER — Ambulatory Visit (INDEPENDENT_AMBULATORY_CARE_PROVIDER_SITE_OTHER): Payer: 59 | Admitting: Family Medicine

## 2019-07-07 DIAGNOSIS — K5732 Diverticulitis of large intestine without perforation or abscess without bleeding: Secondary | ICD-10-CM

## 2019-07-07 NOTE — Patient Instructions (Addendum)
-   Continue with relatively low-fiber diet.  - For now, whole-wheat breads are fine; most are very finely milled.   - Delay adding nuts and seeds for a total of 6 weeks from last symptoms to be on the safe side.  You can add vegetables and fruits that contain seeds slowly and progressively in the next couple of weeks. - If you have pain, back off the sources of fiber again.   - Drink plenty of fluids, aiming for at least 60 oz per day.  (Start your day with 12-16 oz water as soon as you get up.)  - Diet for diverticulosis is high-fiber; aim for ~14 g fiber per 1000 calories.  In other words, get ~25-30 grams of fiber per day. Work up to this level slowly and progressively.    - Attention to fiber content on food labels will be helpful for now.    - In general, consider fruits and veg's to provide 3-5 grams per serving.      (Berries - use 1/2 cup as a serving size.); most beans provide 10-15 g fiber per cup.    - Eat 3 real meals and 1-2 snacks per day.    In addition:  - Supplement use: I recommend switching your calcium to a lower dose, e.g., 250 mg, to be used 2-3 X day.  Try to get most of your calcium through food.  As you increase vegetables, that will increase your magnesium intake.  Emphasizing dark leafy greens will provide both good amounts of calcium and magnesium.    - Especially if you are working long hours, USE that under-desk elliptical more often through the day.  Intermittent physical activity is best for all of [place].

## 2019-07-07 NOTE — Progress Notes (Signed)
Telehealth Encounter I connected with DARRIEN DUTTER (MRN [id number]) on 07/07/2019 by MyChart video-enabled, HIPAA-compliant telemedicine application, verified that I am speaking with the correct person using two identifiers, and that the patient was in a private environment conducive to confidentiality.  The patient agreed to proceed.  Provider was Kennith Center, PhD, RD, LDN, CEDRD Provider(s) located at home during this telehealth encounter; patient was at home  Appt start time: 1100 end time: 1200 (1 hour)  Reason for telehealth visit: Medical Nutrition Therapy for diet for diverticular disease.    Relevant history/background: Dashaun was diagnosed with diverticulitis last week.  Last time she experienced abd pain was on 07/02/19.  Also has a hx of elevated BP (~140/90).   Assessment:  Usual eating pattern: 2-3 meals and 2-3 snacks per day. Frequent foods and beverages: water, coffee w/ vanilla soy creamer, vitamin water; banana, Special K High-protein, nuts, raisins w/ 1% milk, yogurt, eggs, bread.   Avoided foods: salty foods (for BP), red meat.   Usual physical activity: not much this winter;  Has been working 10-12 hrs a day recently, although has an under-desk elliptical machine she uses at least 20 minutes per day.   Sleep: Estimates she gets 5-6 hrs/night on weekdays, up to 10 hrs on weekends.  24-hr recalll:  (Up at 10:30 AM) B ( AM)-  --- Snk ( AM)-  Vitamin water L (1 PM)-  1 c chx salad (minimal mayo), 2/3 c tortellini salad w/ spinach, sundried tom, 1 c cantaloupe chunks, water, coffee, vanilla soy creamer Snk (6 PM)-  Water; 10 [place] crackers, 1/2 c feta dip D (8 PM)-  1 [place] burger on bun, ketchup&must, 1 c potato chips, water Snk ( PM)-  2 pc choc Typical day? No.(Yesterday was Super[employer]owl.)  More typical is brkfst of yogurt + banana or cereal + milk; snacks of nuts/yogurt/fruit; lunch of deli [place] + veg's w/ low-fat drsng or plant bowls or frozen dinner 1-2 X wk; dinner of  pro, starch, veg's.    Intervention: Completed diet and exercise hx, and provided dietary recommendations for diverticulitis and diverticulosis.    For recommendations, see Patient Instructions.    Follow-up: prn.   Octavia Mottola,JEANNIE

## 2019-07-09 ENCOUNTER — Other Ambulatory Visit: Payer: 59

## 2019-07-10 ENCOUNTER — Ambulatory Visit (INDEPENDENT_AMBULATORY_CARE_PROVIDER_SITE_OTHER): Payer: 59 | Admitting: Family Medicine

## 2019-07-10 ENCOUNTER — Other Ambulatory Visit: Payer: Self-pay

## 2019-07-10 VITALS — BP 162/82 | HR 72 | Wt 143.4 lb

## 2019-07-10 DIAGNOSIS — Z6830 Body mass index (BMI) 30.0-30.9, adult: Secondary | ICD-10-CM

## 2019-07-10 DIAGNOSIS — Z13220 Encounter for screening for lipoid disorders: Secondary | ICD-10-CM

## 2019-07-10 DIAGNOSIS — K5792 Diverticulitis of intestine, part unspecified, without perforation or abscess without bleeding: Secondary | ICD-10-CM

## 2019-07-10 DIAGNOSIS — R030 Elevated blood-pressure reading, without diagnosis of hypertension: Secondary | ICD-10-CM

## 2019-07-10 DIAGNOSIS — E6609 Other obesity due to excess calories: Secondary | ICD-10-CM

## 2019-07-10 DIAGNOSIS — R1032 Left lower quadrant pain: Secondary | ICD-10-CM | POA: Diagnosis not present

## 2019-07-10 NOTE — Assessment & Plan Note (Signed)
Improving.  Did not require antibiotics

## 2019-07-10 NOTE — Assessment & Plan Note (Addendum)
Resolved BMI from 30 -> 26

## 2019-07-10 NOTE — Assessment & Plan Note (Signed)
This persists.  She is very reluctant to take medications and I do not feel she is at high risk for complications immediately but it would be ideal to get better monitoring

## 2019-07-10 NOTE — Patient Instructions (Addendum)
Great to see you  Decide how you want to monitor your blood pressure  You have made great improvements in many areas  Come back in 6 months to discuss blood pressure further  Consider shingles shot once Covid is over  I will call you if your tests are not good.  Otherwise I will send you a letter.  If you do not hear from me with in 2 weeks please call our office.

## 2019-07-10 NOTE — Progress Notes (Signed)
   CHIEF COMPLAINT / HPI:  DIVERTICULITIS FOLLOW UP Found on CT last week Feeling much better.  Minimal pain.  Eating well no fever Did not start the antibiotics   HYPERTENSION Home BP Monitoring readings: Does not check Was quite elevated when giving blood.   Chest pain- no     Medications   Compliance-  Working hard to control weight and exercise regularly. Lightheadedness-  no  Edema- no No longer using NSAIDS regularly  Patient reports no  vision/ hearing changes,anorexia, weight change, fever ,adenopathy, persistant / recurrent hoarseness, swallowing issues, chest pain, edema,persistant / recurrent cough, hemoptysis, dyspnea(rest, exertional, paroxysmal nocturnal), gastrointestinal  bleeding (melena, rectal bleeding), abdominal pain, excessive heart burn, syncope, focal weakness, severe memory loss, concerning skin lesions, depression, anxiety, abnormal bruising/bleeding, major joint swelling, breast masses or abnormal vaginal bleeding.      PERTINENT  PMH / PSH: chronically elevated bp   OBJECTIVE: BP (!) 162/82   Pulse 72   Wt 143 lb 6.4 oz (65 kg)   LMP 12/27/2016   SpO2 99%   BMI 26.23 kg/m   Heart - Regular rate and rhythm.  No murmurs, gallops or rubs.    Lungs:  Normal respiratory effort, chest expands symmetrically. Lungs are clear to auscultation, no crackles or wheezes. Abdomen: soft and non-tender without masses, organomegaly or hernias noted.  No guarding or rebound Extremities:  No cyanosis, edema, or deformity noted with good range of motion of all major joints.     ASSESSMENT / PLAN:  Elevated BP without diagnosis of hypertension This persists.  She is very reluctant to take medications and I do not feel she is at high risk for complications immediately but it would be ideal to get better monitoring  Diverticulitis Improving.  Did not require antibiotics   OBESITY, UNSPECIFIED Resolved BMI from 30 -> 26      Shannon Fields, Shannon Fields

## 2019-07-11 LAB — LIPID PANEL
Chol/HDL Ratio: 2.9 ratio (ref 0.0–4.4)
Cholesterol, Total: 203 mg/dL — ABNORMAL HIGH (ref 100–199)
HDL: 71 mg/dL (ref >39)
LDL Chol Calc (NIH): 120 mg/dL — ABNORMAL HIGH (ref 0–99)
Triglycerides: 66 mg/dL (ref 0–149)
VLDL Cholesterol Cal: 12 mg/dL (ref 5–40)

## 2019-07-11 LAB — HEPATIC FUNCTION PANEL
ALT: 21 IU/L (ref 0–32)
AST: 17 IU/L (ref 0–40)
Albumin: 4.4 g/dL (ref 3.8–4.9)
Alkaline Phosphatase: 62 IU/L (ref 39–117)
Bilirubin Total: 0.3 mg/dL (ref 0.0–1.2)
Bilirubin, Direct: 0.09 mg/dL (ref 0.00–0.40)
Total Protein: 6.7 g/dL (ref 6.0–8.5)

## 2019-07-31 DIAGNOSIS — H5213 Myopia, bilateral: Secondary | ICD-10-CM | POA: Diagnosis not present

## 2019-07-31 DIAGNOSIS — H524 Presbyopia: Secondary | ICD-10-CM | POA: Diagnosis not present

## 2019-07-31 DIAGNOSIS — H52203 Unspecified astigmatism, bilateral: Secondary | ICD-10-CM | POA: Diagnosis not present

## 2019-08-06 ENCOUNTER — Ambulatory Visit: Payer: 59 | Admitting: Pharmacist

## 2019-12-24 ENCOUNTER — Other Ambulatory Visit: Payer: Self-pay | Admitting: Family Medicine

## 2019-12-24 DIAGNOSIS — Z1231 Encounter for screening mammogram for malignant neoplasm of breast: Secondary | ICD-10-CM

## 2020-01-01 ENCOUNTER — Ambulatory Visit
Admission: RE | Admit: 2020-01-01 | Discharge: 2020-01-01 | Disposition: A | Discharge status: Home / Self Care | Payer: 59 | Source: Ambulatory Visit | Attending: Family Medicine | Admitting: Family Medicine

## 2020-01-01 ENCOUNTER — Other Ambulatory Visit: Payer: Self-pay

## 2020-01-01 DIAGNOSIS — Z1231 Encounter for screening mammogram for malignant neoplasm of breast: Secondary | ICD-10-CM

## 2020-01-01 IMAGING — MG DIGITAL SCREENING BILAT W/ TOMO W/ CAD
8 series · 8 of 24 positions shown · non-contrast
Comparison: Previous exam(s).

CLINICAL DATA: Screening.

EXAM:
DIGITAL SCREENING BILATERAL MAMMOGRAM WITH TOMO AND CAD

[R CC synth-2D]
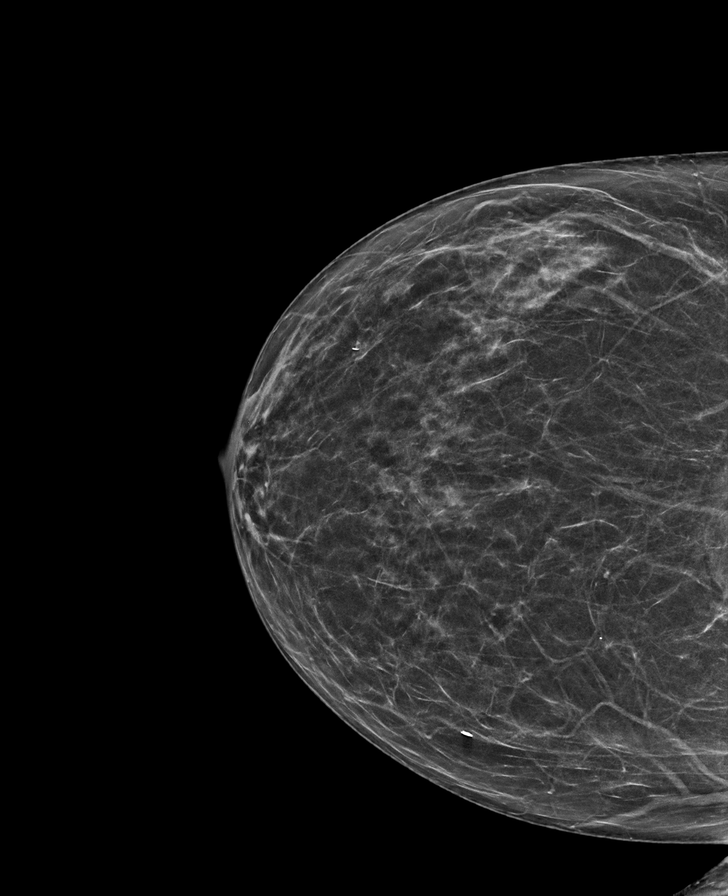

[L CC synth-2D]
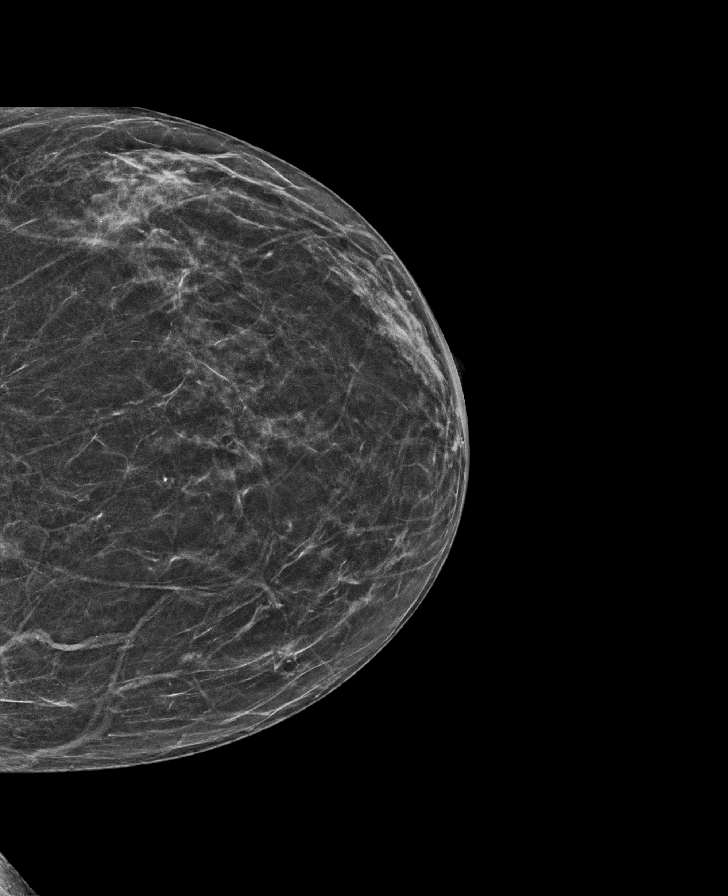

[R MLO synth-2D]
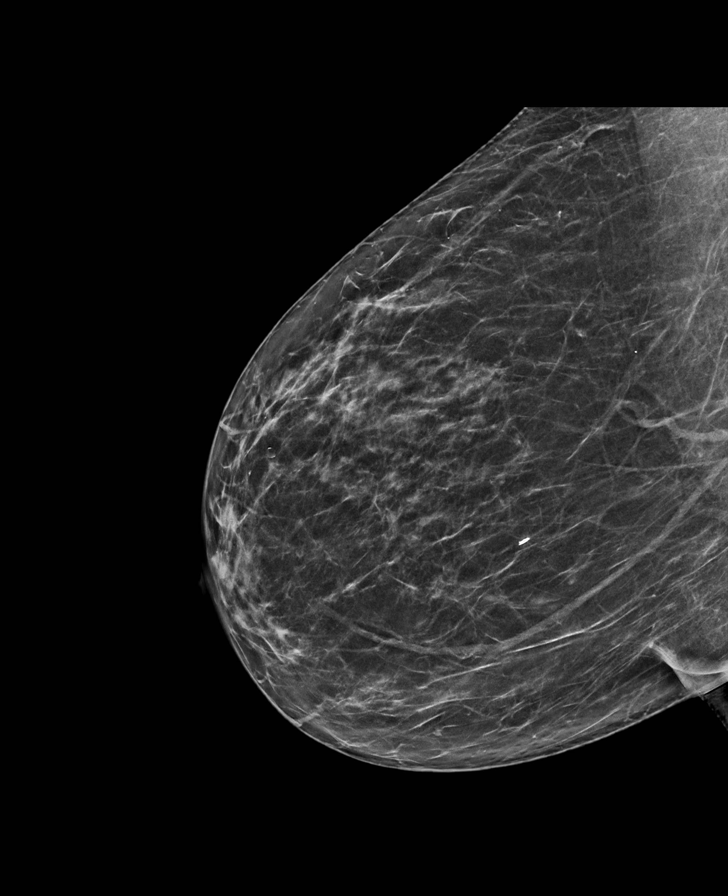

[L MLO synth-2D]
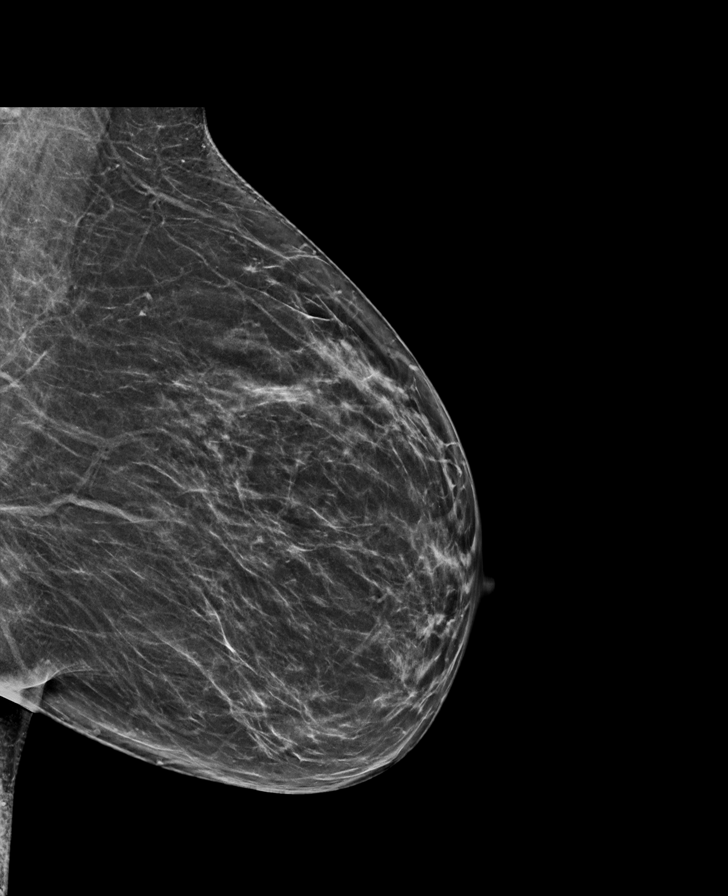

[L CC tomo · tomo slice 29/58.0]
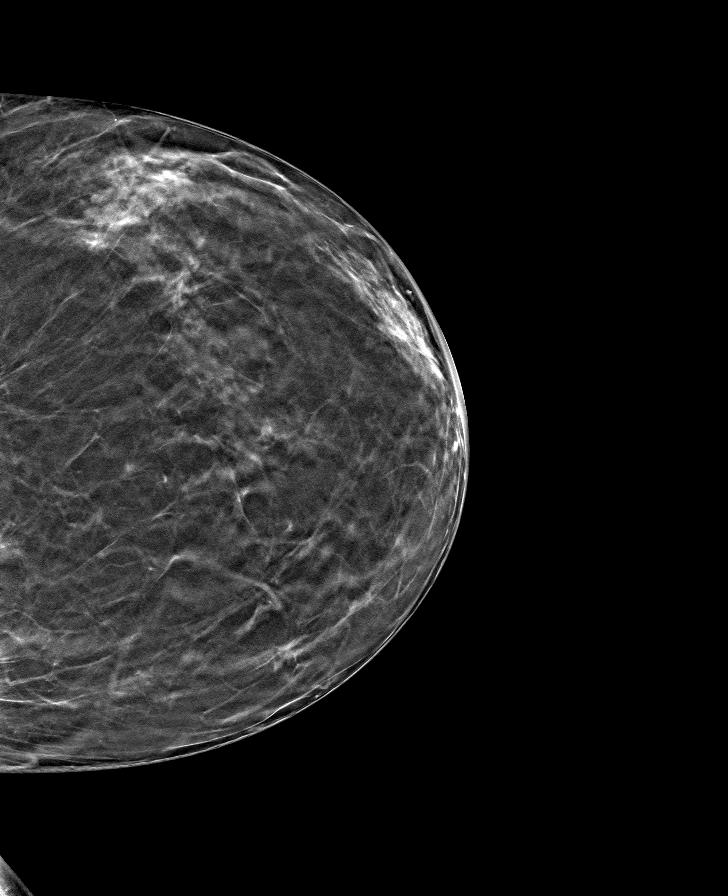

[L MLO tomo · tomo slice 35/68.0]
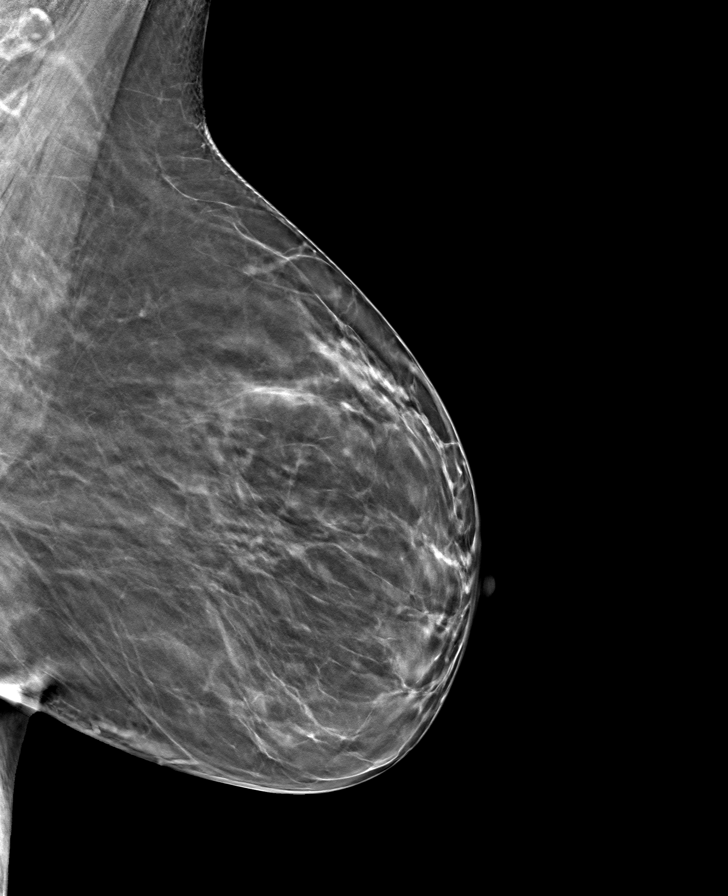

[R MLO tomo · tomo slice 35/68.0]
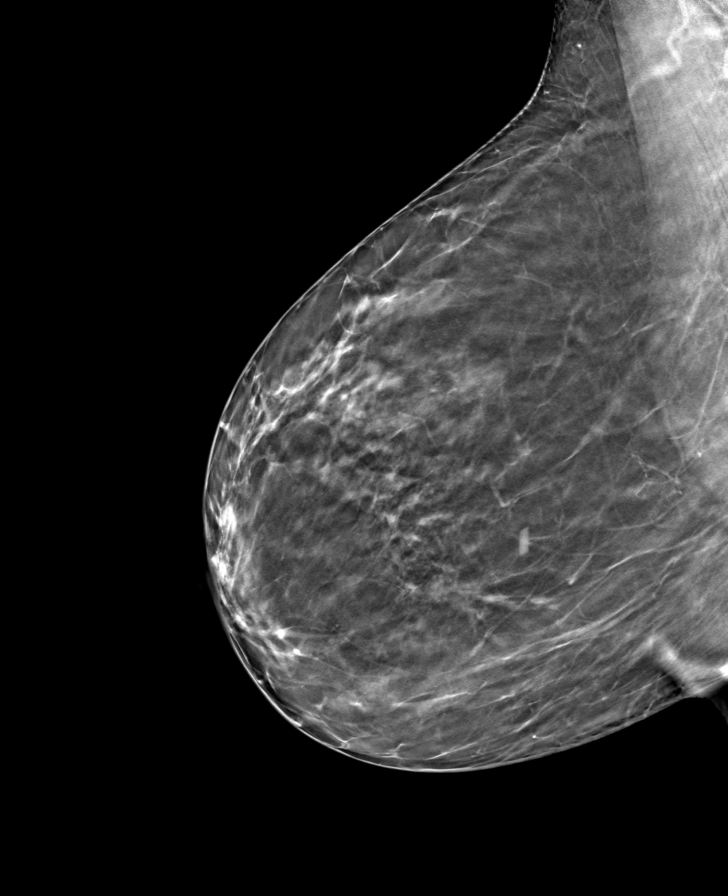

[R CC tomo · tomo slice 31/62.0]
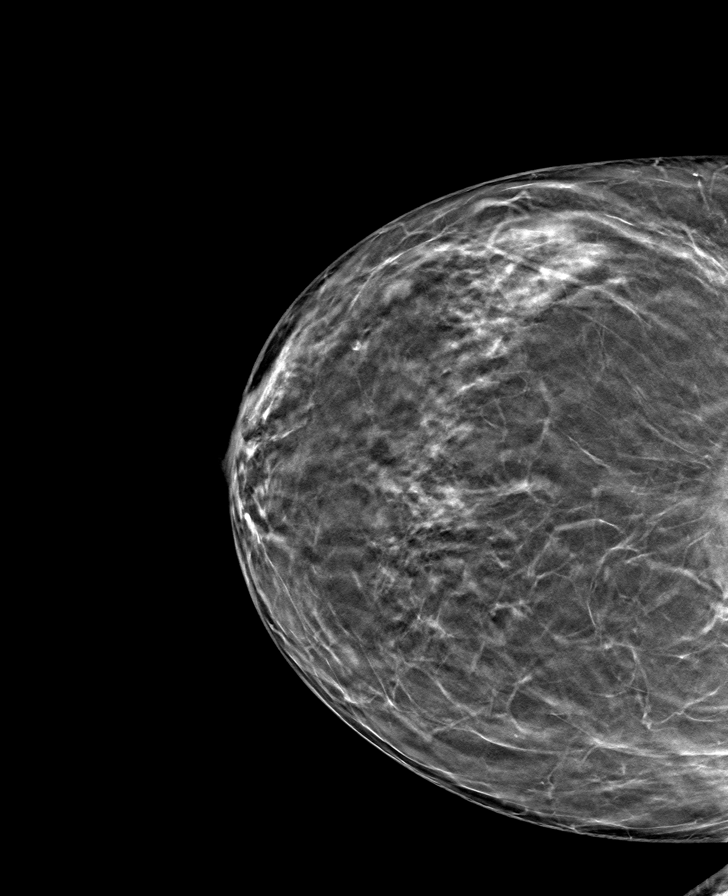

[8 of 24 positions shown; findings below may reference images not displayed]

ACR Breast Density Category b: There are scattered areas of
fibroglandular density.
FINDINGS: There are no findings suspicious for malignancy. Images were
processed with CAD.
IMPRESSION: No mammographic evidence of malignancy. A result letter of this
screening mammogram will be mailed directly to the patient.

RECOMMENDATION:
Screening mammogram in one year. (Code:[TQ])

BI-RADS CATEGORY  1: Negative.

## 2020-02-18 DIAGNOSIS — D2261 Melanocytic nevi of right upper limb, including shoulder: Secondary | ICD-10-CM | POA: Diagnosis not present

## 2020-02-18 DIAGNOSIS — D2272 Melanocytic nevi of left lower limb, including hip: Secondary | ICD-10-CM | POA: Diagnosis not present

## 2020-02-18 DIAGNOSIS — D2271 Melanocytic nevi of right lower limb, including hip: Secondary | ICD-10-CM | POA: Diagnosis not present

## 2020-02-18 DIAGNOSIS — L821 Other seborrheic keratosis: Secondary | ICD-10-CM | POA: Diagnosis not present

## 2020-02-18 DIAGNOSIS — D1801 Hemangioma of skin and subcutaneous tissue: Secondary | ICD-10-CM | POA: Diagnosis not present

## 2020-02-18 DIAGNOSIS — D225 Melanocytic nevi of trunk: Secondary | ICD-10-CM | POA: Diagnosis not present

## 2020-02-18 DIAGNOSIS — L570 Actinic keratosis: Secondary | ICD-10-CM | POA: Diagnosis not present

## 2020-02-18 DIAGNOSIS — L814 Other melanin hyperpigmentation: Secondary | ICD-10-CM | POA: Diagnosis not present

## 2020-02-18 DIAGNOSIS — L812 Freckles: Secondary | ICD-10-CM | POA: Diagnosis not present

## 2020-03-30 DIAGNOSIS — J029 Acute pharyngitis, unspecified: Secondary | ICD-10-CM | POA: Diagnosis not present

## 2020-03-30 DIAGNOSIS — J0110 Acute frontal sinusitis, unspecified: Secondary | ICD-10-CM | POA: Diagnosis not present

## 2020-03-31 ENCOUNTER — Ambulatory Visit: Payer: 59

## 2020-04-01 MED FILL — AMOX-CLAV 500-125 MG TABLET: 500-125 | 5 days supply | Qty: 10 | Fill #0

## 2020-04-06 ENCOUNTER — Ambulatory Visit: Payer: 59 | Admitting: Family Medicine

## 2020-04-14 DIAGNOSIS — N950 Postmenopausal bleeding: Secondary | ICD-10-CM | POA: Diagnosis not present

## 2020-04-27 DIAGNOSIS — D259 Leiomyoma of uterus, unspecified: Secondary | ICD-10-CM | POA: Diagnosis not present

## 2020-04-27 DIAGNOSIS — N950 Postmenopausal bleeding: Secondary | ICD-10-CM | POA: Diagnosis not present

## 2020-04-28 ENCOUNTER — Ambulatory Visit: Payer: 59 | Admitting: Family Medicine

## 2020-04-28 ENCOUNTER — Encounter: Payer: Self-pay | Admitting: Family Medicine

## 2020-04-28 ENCOUNTER — Other Ambulatory Visit: Payer: Self-pay

## 2020-04-28 DIAGNOSIS — R030 Elevated blood-pressure reading, without diagnosis of hypertension: Secondary | ICD-10-CM | POA: Diagnosis not present

## 2020-04-28 DIAGNOSIS — M542 Cervicalgia: Secondary | ICD-10-CM | POA: Diagnosis not present

## 2020-04-28 LAB — POCT UA - MICROSCOPIC ONLY

## 2020-04-28 LAB — POCT URINALYSIS DIP (MANUAL ENTRY)
Bilirubin, UA: NEGATIVE
Glucose, UA: NEGATIVE mg/dL
Ketones, POC UA: NEGATIVE mg/dL
Nitrite, UA: NEGATIVE
Protein Ur, POC: NEGATIVE mg/dL
Spec Grav, UA: 1.02 (ref 1.010–1.025)
Urobilinogen, UA: 0.2 E.U./dL
pH, UA: 7.5 (ref 5.0–8.0)

## 2020-04-28 NOTE — Assessment & Plan Note (Signed)
Given chronicity sounds consistent with musculoskeletal pain.   Discussed approaches.  She may consider investigating breast reduction

## 2020-04-28 NOTE — Patient Instructions (Addendum)
Good to see you  For the blood pressure - set up an ambulatory blood pressure monitoring with Laurey Arrow - I will send you a MyChart with your UA results and about the ambulatory blood pressure

## 2020-04-28 NOTE — Progress Notes (Signed)
    SUBJECTIVE:   CHIEF COMPLAINT / HPI:   NECK PAIN Bilateral posterior neck pain for years that has been slowly worsening.  Feels tight with tenderness.  Believes may be related to her breast size.  No weakness or neurologic symptoms.  Does interfere with work activities  ELEVATED BP Has had elevated blood pressure for years.  Never taken medication.  Has worked hard with exercise and diet.  Mother had hypertension.  Has not had an ambulatory blood pressure monitor.  Always seems to be higher in health care settings   PERTINENT  PMH / [place]: does have history of snoring.  Unsure about apnea.  Having work up for Endometrial bleeding   OBJECTIVE:   BP (!) 165/80   Pulse 70   Ht 5\' 2"  (1.575 m)   Wt 145 lb 9.6 oz (66 kg)   LMP 12/27/2016   SpO2 99%   BMI 26.63 kg/m   Repeat blood pressure 162/94  Heart - Regular rate and rhythm.  No murmurs, gallops or rubs.    Lungs:  Normal respiratory effort, chest expands symmetrically. Lungs are clear to auscultation, no crackles or wheezes. Extremities:  No cyanosis, edema, or deformity noted with good range of motion of all major joints.      ASSESSMENT/PLAN:   Neck pain Given chronicity sounds consistent with musculoskeletal pain.   Discussed approaches.  She may consider investigating breast reduction  Elevated BP without diagnosis of hypertension BP Readings from Last 3 Encounters:  04/28/20 (!) 165/80  07/10/19 (!) 162/82  06/30/19 (!) 164/92   Chronically elevated in health care settings.  Prior blood work normal.  Will check UA for protein.  Very likely essential hypertension.  She will schedule a ambulatory blood pressure monitor with Dr Valentina Lucks      Lind Covert, [place]

## 2020-04-28 NOTE — Assessment & Plan Note (Signed)
BP Readings from Last 3 Encounters:  04/28/20 (!) 165/80  07/10/19 (!) 162/82  06/30/19 (!) 164/92   Chronically elevated in health care settings.  Prior blood work normal.  Will check UA for protein.  Very likely essential hypertension.  She will schedule a ambulatory blood pressure monitor with Dr Valentina Lucks

## 2020-04-30 ENCOUNTER — Ambulatory Visit (INDEPENDENT_AMBULATORY_CARE_PROVIDER_SITE_OTHER): Payer: 59 | Admitting: Pharmacist

## 2020-04-30 ENCOUNTER — Other Ambulatory Visit: Payer: Self-pay

## 2020-04-30 DIAGNOSIS — R030 Elevated blood-pressure reading, without diagnosis of hypertension: Secondary | ICD-10-CM | POA: Diagnosis not present

## 2020-04-30 NOTE — Patient Instructions (Signed)
Wearing the Blood Pressure Monitor  The cuff will inflate every 20 minutes during the day and every 30 minutes while you sleep.  Your blood pressure readings will NOT display after cuff inflation  Fill out the blood pressure-activity diary during the day, especially during activities that may affect your reading -- such as exercise, stress, walking, taking your blood pressure medications  Important things to know:  Avoid taking the monitor off for the next 24 hours, unless it causes you discomfort or pain.  Do NOT get the monitor wet and do NOT dry to clean the monitor with any cleaning products.  Do NOT put the monitor on anyone else's arm.  When the cuff inflates, avoid excess movement. Let the cuffed arm hang loosely, slightly away from the body. Avoid flexing the muscles or moving the hand/fingers.  When you go to sleep, make sure that the hose is not kinked.  Remember to fill out the blood pressure activity diary.  If you experience severe pain or unusual pain (not associated with getting your blood pressure checked), remove the monitor.  Troubleshooting:  Code  Troubleshooting   1  Check cuff position, tighten cuff   2, 3  Remain still during reading   4, 87  Check air hose connections and make sure cuff is tight   85, 89  Check hose connections and make tubing is not crimped   86  Push START/STOP to restart reading   88, 91  Retry by pushing START/STOP   90  Replace batteries. If problem persists, remove monitor and bring back to   clinic at follow up   97, 98, 99  Service required - Remove monitor and bring back to clinic at follow up   Blood Pressure Activity Diary Time Lying down/ Sleeping Walking/ Exercise Stressed/ Angry Headache/ Pain Dizzy  9 AM       10 AM       11 AM       12 PM       1 PM       2 PM       Time Lying down/ Sleeping Walking/ Exercise Stressed/ Angry Headache/ Pain Dizzy  3 PM       4 PM        5 PM       6 PM       7 PM       8 PM        Time Lying down/ Sleeping Walking/ Exercise Stressed/ Angry Headache/ Pain Dizzy  9 PM       10 PM       11 PM       12 AM       1 AM       2 AM       3 AM       Time Lying down/ Sleeping Walking/ Exercise Stressed/ Angry Headache/ Pain Dizzy  4 AM       5 AM       6 AM       7 AM       8 AM       9 AM       10 AM        Time you woke up: _________                  Time you went to sleep:__________    Come back Monday after 10:00 AM  to  return the monitor and obtain results  Call the [facility] if you have any questions before then [phone])

## 2020-04-30 NOTE — Progress Notes (Signed)
   S:    Patient arrives in good spirits, ambulating without assistance. Presents to the clinic for ambulatory blood pressure evaluation.   Patient was referred and last seen by Primary Care Provider on 04/28/2020.  History of elevated blood pressures over the last 3 years.   Discussed procedure for wearing the monitor and gave patient written instructions. Monitor was placed on non-dominant arm with instructions to return in the morning.   Current BP Medications include:  None   Dietary habits include: Efforts to reduce salt intake and trying to eat healthier.  O:   Physical Exam   ROS  Last 3 Office BP readings: BP Readings from Last 3 Encounters:  04/30/20 (!) 185/95  04/28/20 (!) 165/80  07/10/19 (!) 162/82     Clinical Atherosclerotic Cardiovascular Disease (ASCVD): No  The 10-year ASCVD risk score Mikey Bussing DC Jr., et al., 2013) is: 5.1%   Values used to calculate the score:     Age: 59 years     Sex: Female     Is Non-Hispanic African American: No     Diabetic: No     Tobacco smoker: No     Systolic Blood Pressure: 885 mmHg     Is BP treated: No     HDL Cholesterol: 71 mg/dL     Total Cholesterol: 203 mg/dL  Basic Metabolic Panel    Component Value Date/Time   NA 140 06/30/2019 1512   K 4.3 06/30/2019 1512   CL 98 06/30/2019 1512   CO2 21 06/30/2019 1512   GLUCOSE 88 06/30/2019 1512   GLUCOSE 91 01/26/2016 1209   BUN 8 06/30/2019 1512   CREATININE 0.79 06/30/2019 1512   CREATININE 0.69 01/26/2016 1209   CALCIUM 9.7 06/30/2019 1512   GFRNONAA 83 06/30/2019 1512   GFRNONAA >89 01/26/2016 1209   GFRAA 95 06/30/2019 1512   GFRAA >89 01/26/2016 1209    Renal function: CrCl cannot be calculated (Patient's most recent lab result is older than the maximum 21 days allowed.).    ABPM Study Data: Arm Placement left arm   Overall Mean 24hr BP:   137/79 mmHg HR: 72   Daytime Mean BP:  144/83 mmHg HR: 73   Nighttime Mean BP:  123/70 mmHg HR: 69    Dipping Pattern: Yes.    Sys:   14.7%   Dia: 16.5%   [normal dipping ~10-20%]  Non-hypertensive ABPM thresholds: daytime BP <135/85 mmHg, sleeptime BP <120/70 mmHg     A/P: History of elevated blood pressure readings including significantly elevated episodic readings. Found to have white coat hypertension  given 24-hour ambulatory blood pressure whichj demonstrates fair but slightly elevated readings after initial period of elevation immediately after placement in office and then while at work. Average entire day blood pressure of 137/79 mmHg, awake average readings of 144/83 and a nocturnal dipping pattern that is normal.  Reviewed with PCP, Dr. Erin Hearing, and changes to medications deferred to to him for later in the day.  - Suggest consideration for initial trial of low dose ARB - losartan 25 or 50mg  once daily.  - If BP remains elevated consideration for low dose ARB/low dose thiazide combination therapy in the future.   Results reviewed and written information provided.  Total time in face-to-face counseling 20 minutes.  Copies of reports and ACC/AHA goals provided to both patient and PCP.  F/U by phone later this afternoon by PCP.   Patient seen with Hughes Better, PharmD.

## 2020-05-03 ENCOUNTER — Encounter: Payer: Self-pay | Admitting: Pharmacist

## 2020-05-03 NOTE — Progress Notes (Signed)
Reviewed: Agree with Dr. Koval's documentation and management. 

## 2020-05-03 NOTE — Assessment & Plan Note (Signed)
History of elevated blood pressure readings including significantly elevated episodic readings. Found to have white coat hypertension  given 24-hour ambulatory blood pressure whichj demonstrates fair but slightly elevated readings after initial period of elevation immediately after placement in office and then while at work. Average entire day blood pressure of 137/79 mmHg, awake average readings of 144/83 and a nocturnal dipping pattern that is normal.  Reviewed with PCP, Dr. Erin Hearing, and changes to medications deferred to to him for later in the day.  - Suggest consideration for initial trial of low dose ARB - losartan 25 or 50mg  once daily.  - If BP remains elevated consideration for low dose ARB/low dose thiazide combination therapy in the future.

## 2020-05-05 ENCOUNTER — Telehealth: Payer: Self-pay | Admitting: Family Medicine

## 2020-05-05 NOTE — Telephone Encounter (Signed)
On 12/3 Had discussion regarding her amb blood pressure study.  She will consider medications as well as investigate biofeedback.  She will continue to follow her blood pressure And let me know

## 2020-05-07 ENCOUNTER — Other Ambulatory Visit (HOSPITAL_COMMUNITY): Payer: Self-pay | Admitting: Obstetrics and Gynecology

## 2020-05-07 MED FILL — miSOPROStol 200 MCG TABS: 200 | 1 days supply | Qty: 1 | Fill #0

## 2020-05-12 DIAGNOSIS — N950 Postmenopausal bleeding: Secondary | ICD-10-CM | POA: Diagnosis not present

## 2020-05-12 DIAGNOSIS — N879 Dysplasia of cervix uteri, unspecified: Secondary | ICD-10-CM | POA: Diagnosis not present

## 2020-05-19 ENCOUNTER — Encounter: Payer: Self-pay | Admitting: Family Medicine

## 2020-05-19 ENCOUNTER — Other Ambulatory Visit: Payer: Self-pay

## 2020-05-19 ENCOUNTER — Ambulatory Visit: Payer: 59 | Admitting: Family Medicine

## 2020-05-19 VITALS — BP 168/92 | HR 71 | Wt 143.6 lb

## 2020-05-19 DIAGNOSIS — Z13220 Encounter for screening for lipoid disorders: Secondary | ICD-10-CM

## 2020-05-19 DIAGNOSIS — R221 Localized swelling, mass and lump, neck: Secondary | ICD-10-CM | POA: Diagnosis not present

## 2020-05-19 DIAGNOSIS — R030 Elevated blood-pressure reading, without diagnosis of hypertension: Secondary | ICD-10-CM | POA: Diagnosis not present

## 2020-05-19 NOTE — Progress Notes (Signed)
    SUBJECTIVE:   CHIEF COMPLAINT / HPI:   HYPERTENSION checking at home and on 12/12-160/90, 12/13 til now upper 120-130/lower 90s. For 12/15 was 180/100 at procedure, came down to 160/90 before starting and they reported 120/70 or lower on sedation.    ADENOPATHY Feeling firm fullness in her neck below jaw bilaterally after 6 months from pharyngitis.  Have decreased since initial illness.  No other adenopathy or rash or unusual fatigue.  No pain with sour foods  GYN Hysteroscopy last Wed 12/15. Dr. Julien Girt could not get very far in due to a wall of possible scar tissue blocking off most of my uterus.  Curious why that was not seen on the previous saline U/S? But therefore the polyp seen on U/S previously was posterior uterus so she could not get to it to do a sample. My lining layer is higher than should be and she got a bx from front area that I am waiting to hear about the Results. My follow-up is on 1/3.    OBJECTIVE:   BP (!) 168/92   Pulse 71   Wt 143 lb 9.6 oz (65.1 kg)   LMP 12/27/2016   SpO2 99%   BMI 26.26 kg/m   Neck:  No deformities, thyromegaly, masses, or tenderness noted.   Bilateral submandibular glands are firm smooth normal size without tenderness.   No other masses appreciated No axillary adenopathy   ASSESSMENT/PLAN:   Elevated BP without diagnosis of hypertension Stable.  Diastolic are in lower 11H consistently at home.  Will continue to monitor through the spring. Consider low dose diuretic if persistently elevated.    Neck swelling I think her submandibular glands are normal if slightly prominent.  Given history of ph[facility]itis with [facility]opathy [facility] will check CBC with diff looking for changes consistent with EBV infection.     GYN She will follow up with gyn and also consult with gyn oncology as to next steps   Lind Covert, [place]

## 2020-05-19 NOTE — Patient Instructions (Addendum)
Good to see you today!  Thanks for coming in.  Let me know about the Gyn recommendations and suggestions  Come in fasting tomorrow for future CBC with diff and cholesterol  Continue checking the blood pressure - in AMs and PMs  I will let you know about the tests

## 2020-05-19 NOTE — Assessment & Plan Note (Signed)
I think her submandibular glands are normal if slightly prominent.  Given history of pharyngitis with adenopathy [facility] will check CBC with diff looking for changes consistent with EBV infection.

## 2020-05-19 NOTE — Assessment & Plan Note (Signed)
Stable.  Diastolic are in lower 40G consistently at home.  Will continue to monitor through the spring. Consider low dose diuretic if persistently elevated.

## 2020-05-20 ENCOUNTER — Other Ambulatory Visit: Payer: 59

## 2020-05-20 ENCOUNTER — Telehealth: Payer: Self-pay

## 2020-05-20 DIAGNOSIS — Z13220 Encounter for screening for lipoid disorders: Secondary | ICD-10-CM

## 2020-05-20 DIAGNOSIS — R221 Localized swelling, mass and lump, neck: Secondary | ICD-10-CM

## 2020-05-20 NOTE — Telephone Encounter (Signed)
LM to call back to the office to schedule a new patient appointment with Dr. Jeral Pinch in January 2022

## 2020-05-20 NOTE — Telephone Encounter (Signed)
Made an appointment with Dr. Berline Lopes on 06-05-19 at 1030. Reviewed new patient instructions and location with Ms Stough.

## 2020-05-21 LAB — CBC WITH DIFFERENTIAL/PLATELET
Basophils Absolute: 0 x10E3/uL (ref 0.0–0.2)
Basos: 1 %
EOS (ABSOLUTE): 0.2 x10E3/uL (ref 0.0–0.4)
Eos: 4 %
Hematocrit: 43.2 % (ref 34.0–46.6)
Hemoglobin: 14.8 g/dL (ref 11.1–15.9)
Immature Grans (Abs): 0 x10E3/uL (ref 0.0–0.1)
Immature Granulocytes: 0 %
Lymphocytes Absolute: 2.2 x10E3/uL (ref 0.7–3.1)
Lymphs: 39 %
MCH: 30.9 pg (ref 26.6–33.0)
MCHC: 34.3 g/dL (ref 31.5–35.7)
MCV: 90 fL (ref 79–97)
Monocytes Absolute: 0.4 x10E3/uL (ref 0.1–0.9)
Monocytes: 6 %
Neutrophils Absolute: 2.7 x10E3/uL (ref 1.4–7.0)
Neutrophils: 50 %
Platelets: 311 x10E3/uL (ref 150–450)
RBC: 4.79 x10E6/uL (ref 3.77–5.28)
RDW: 11.8 % (ref 11.7–15.4)
WBC: 5.5 x10E3/uL (ref 3.4–10.8)

## 2020-05-21 LAB — LIPID PANEL
Chol/HDL Ratio: 2.4 ratio (ref 0.0–4.4)
Cholesterol, Total: 213 mg/dL — ABNORMAL HIGH (ref 100–199)
HDL: 90 mg/dL (ref >39)
LDL Chol Calc (NIH): 107 mg/dL — ABNORMAL HIGH (ref 0–99)
Triglycerides: 94 mg/dL (ref 0–149)
VLDL Cholesterol Cal: 16 mg/dL (ref 5–40)

## 2020-06-03 ENCOUNTER — Encounter: Payer: Self-pay | Admitting: Gynecologic Oncology

## 2020-06-04 ENCOUNTER — Inpatient Hospital Stay: Payer: 59 | Attending: Gynecologic Oncology | Admitting: Gynecologic Oncology

## 2020-06-04 ENCOUNTER — Other Ambulatory Visit: Payer: Self-pay | Admitting: Gynecologic Oncology

## 2020-06-04 ENCOUNTER — Other Ambulatory Visit: Payer: Self-pay | Admitting: Family Medicine

## 2020-06-04 ENCOUNTER — Telehealth: Payer: Self-pay | Admitting: Family Medicine

## 2020-06-04 ENCOUNTER — Encounter: Payer: Self-pay | Admitting: Gynecologic Oncology

## 2020-06-04 ENCOUNTER — Other Ambulatory Visit: Payer: Self-pay

## 2020-06-04 ENCOUNTER — Telehealth: Payer: Self-pay | Admitting: *Deleted

## 2020-06-04 VITALS — BP 170/102 | HR 81 | Temp 96.5°F | Resp 18 | Ht 62.0 in | Wt 142.0 lb

## 2020-06-04 DIAGNOSIS — N950 Postmenopausal bleeding: Secondary | ICD-10-CM

## 2020-06-04 DIAGNOSIS — M8580 Other specified disorders of bone density and structure, unspecified site: Secondary | ICD-10-CM | POA: Diagnosis not present

## 2020-06-04 DIAGNOSIS — R030 Elevated blood-pressure reading, without diagnosis of hypertension: Secondary | ICD-10-CM | POA: Insufficient documentation

## 2020-06-04 MED ORDER — INDAPAMIDE 1.25 MG PO TABS: 1.2500 mg | ORAL_TABLET | Freq: Every day | ORAL | 2 refills | Status: DC

## 2020-06-04 MED FILL — INDAPAMIDE 1.25 MG TABS: 1.25 | 30 days supply | Qty: 30 | Fill #0

## 2020-06-04 NOTE — Telephone Encounter (Signed)
Sent in indapamide

## 2020-06-04 NOTE — Telephone Encounter (Signed)
Hi Lee, Hope all is well w/ your New Year thus far!  Just saw Jeral Pinch, MD at [place]. She has scheduled me for another [facility] outpatient procedure next Thurs. , 1/13 at [facility].   My BP was 180/102 & later 170/102 at the Appt. So I feel in order to get through all this I would like to start mild Rx. As we talked about options for me and hoping a mild diuretic to start with if you feel works okay for this circumstance and I will monitor to track response and keep you updated.   Hoping you might get this where I could possibly pick up before end of day at [facility], or if after they close I could get at [facility] on [address] this wkd. To get it going before Thurs. better.   Thanks for any guidance and help w/ next steps re uterine issues being managed through all the changes.   I am on my cell [phone] where they have closed down our office altogether through Jan. now and working strictly remotely.   Thanks so much, [employer] for [employer]

## 2020-06-04 NOTE — Telephone Encounter (Signed)
Patient called and moved her COVID appt to an earlier time on 1/10

## 2020-06-04 NOTE — Progress Notes (Signed)
GYNECOLOGIC ONCOLOGY NEW PATIENT CONSULTATION   Patient Name: Shannon Fields  Patient Age: 60 y.o. Date of Service: 06/04/20 Referring Provider: Dr. Julien Girt  Primary Care Provider: Lind Covert, MD Consulting Provider: Jeral Pinch, MD   Assessment/Plan:  [phone] with an episode of PMB several months ago with attempted office endometrial sampling that was aborted due to vascular LUS adhesions.  Reviewed work-up thus far in terms of PMB. Also discussed causes of postmenopausal bleeding. While not the most common, endometrial hyperplasia and malignancy are of greatest concern. While it is reassuring that she has not had further bleeding since the episode in November, her thickened endometrial lining on outside ultrasound raised the possibility of endometrial pathology such as a polyp. Given the limitations of office hysteroscopy and D&C in the event of significant bleeding, the next step in her work-up that I would recommend is to proceed with attempt at uterine sampling in the OR. While it is sometimes not feasible to perform endometrial sampling and ultimately we move forward with hysterectomy for definitive diagnosis and treatment, given her surgical history and in the setting of COVID, I strongly recommend attempt again at a more conservative approach (hysteroscopy and sampling). The patient voiced understanding of this recommendation and is amenable to moving forward with scheduling this procedure.   Given elevated blood pressure at her visit (asympomatic), I recommended that the patient reach out to her PCP to discuss the possibility of starting anti-hypertensive treatment.   We discussed plan for hysteroscopy, D&C with Myosure, possible ultrasound guidance on 1/13. The risks of surgery were discussed in detail and she understands these to include infection;, injury to adjacent organs such as bowel, bladder, blood vessels, ureters and nerves; bleeding which may require blood transfusion;  anesthesia risk; thromboembolic events; possible death; unforeseen complications; possible need for re-exploration; medical complications such as heart attack, stroke, pleural effusion and pneumonia. The patient will receive DVT and antibiotic prophylaxis as indicated. She voiced a clear understanding. She had the opportunity to ask questions.   Perioperative instructions will be reviewed with her by phone with Joylene John next week.   A copy of this note was sent to the patient's referring provider.   65 minutes of total time was spent for this patient encounter, including preparation, face-to-face counseling with the patient and coordination of care, and documentation of the encounter.  Jeral Pinch, MD  Division of Gynecologic Oncology  Department of Obstetrics and Gynecology  [facility]  ___________________________________________  Chief Complaint: Chief Complaint  Patient presents with  . PMB (postmenopausal bleeding)    History of Present Illness:  Shannon Fields is a 60 y.o. y.o. female who is seen in consultation at the request of Dr. Julien Girt for an evaluation of postmenopausal bleeding with uterine anatomy limiting possibility for sampling and thickened endometrial lining.  Patient reports last menses in mid 2020.  She has a history of uterine fibroids that have been followed for some time as well as a polyp that was resected after hysteroscopy and D&C in 2014.  Pathology from this procedure revealed benign endometrial polyp, no hyperplasia or malignancy.  In November 2021, the patient had viral pharyngitis (was negative for Covid and strep at the time).  Subsequently, she has what she describes as symptoms of bleeding like a menstrual cycle which started with discharge.  She then had cramping and multiple days of bleeding.  She denies any bleeding, discharge, or cramping since.  She called her gynecologist office and was  evaluated shortly after.  At  the end of November, the patient presented for a saline infusion sonogram with focal thickening of the endometrial lining noted at the fundus, concerning for possible recurrent polyp. In mid December, presented for an in office hysteroscopy and D&C.  Upon visualization, there was a thick wall of adhesions noted in the lower uterine segment that was unable to be maneuvered around.  Endometrial and endocervical sampling was performed with pathology revealing squamous fragments and scanty endocervical mucosa, no endometrial tissue identified.  She endorses a good appetite without any nausea or emesis.  She reports normal bowel and bladder function.  Her blood pressure has improved since she lost weight but has increased again since her illness in November.  She recently had a sleep study which showed relatively normal blood pressure at rest.  She has been checking her blood pressure at home in the morning about an hour after she wakes up.  It generally runs 120/80s-90s.  Her surgical history is notable for multiple hernia repairs including umbilical, epigastric, and left inguinal hernias.  Her left inguinal hernia has required multiple surgeries and she now has a mesh there as well as she thinks some mesh in her upper abdomen.  She had 2 C-sections as well as abdominoplasty with one of her hernia repairs.  Patient lives in [place] with her husband.  She endorses regular alcohol use, denies any tobacco or drug use.  She is worked for [employer] for some years, including in [employer], and currently works at [employer] (working remotely currently).  PAST MEDICAL HISTORY:  Past Medical History:  Diagnosis Date  . Diverticulosis   . History of diverticulitis   . Osteopenia   . PMB (postmenopausal bleeding)   . Uterine fibroid      PAST SURGICAL HISTORY:  Past Surgical History:  Procedure Laterality Date  . ABDOMINOPLASTY    . CESAREAN SECTION     x 2, BTL at time of c/s  . DILATION AND  CURETTAGE, DIAGNOSTIC / THERAPEUTIC    . EPIGASTRIC HERNIA REPAIR    . INGUINAL HERNIA REPAIR     x3  . UMBILICAL HERNIA REPAIR      OB/GYN HISTORY:  OB History  Gravida Para Term Preterm AB Living  3 2     1 2   SAB IAB Ectopic Multiple Live Births               # Outcome Date GA Lbr Len/2nd Weight Sex Delivery Anes PTL Lv  3 AB           2 Para           1 Para             Patient's last menstrual period was 12/27/2016.  Age at menarche: 71 Age at menopause: 33 Hx of HRT: Denies Hx of STDs: Denies Last pap: 04/2018 History of abnormal pap smears: Remote history of an abnormal Pap in the 80s  SCREENING STUDIES:  Last mammogram: 09/2018  Last colonoscopy: 2013 Last bone mineral density: 2018  MEDICATIONS: Outpatient Encounter Medications as of 06/04/2020  Medication Sig  . calcium-vitamin D (OSCAL WITH D) 500-200 MG-UNIT tablet Take 1 tablet by mouth 1 day or 1 dose.  Marland Kitchen HM GARLIC PO Take by mouth daily.  . Magnesium 125 MG CAPS Take 250 mg by mouth in the morning and at bedtime.   . Multiple Vitamin (MULTIVITAMIN) capsule Take 1 capsule by mouth daily.  . Omega 3  1200 MG CAPS Take 1,200 mg by mouth.  . desonide (DESOWEN) 0.05 % cream  (Patient not taking: Reported on 06/03/2020)  . Zoster Vaccine Adjuvanted [facility]) injection Inject 0.5 ml IM and Repeat in 2 months (Patient not taking: Reported on 06/03/2020)   No facility-administered encounter medications on file as of 06/04/2020.    ALLERGIES:  No Known Allergies   FAMILY HISTORY:  Family History  Problem Relation Age of Onset  . Hypertension Mother   . Fibroids Mother   . Lung cancer Mother   . Sudden death Neg Hx   . Diabetes Neg Hx   . Heart attack Neg Hx   . Hyperlipidemia Neg Hx   . Breast cancer Neg Hx   . Ovarian cancer Neg Hx      SOCIAL HISTORY:  Social Connections: Not on file    REVIEW OF SYSTEMS:  Denies appetite changes, fevers, chills, fatigue, unexplained weight changes. Denies hearing  loss, neck lumps or masses, mouth sores, ringing in ears or voice changes. Denies cough or wheezing.  Denies shortness of breath. Denies chest pain or palpitations. Denies leg swelling. Denies abdominal distention, pain, blood in stools, constipation, diarrhea, nausea, vomiting, or early satiety. Denies pain with intercourse, dysuria, frequency, hematuria or incontinence. Denies hot flashes, pelvic pain, vaginal bleeding or vaginal discharge.   Denies joint pain, back pain or muscle pain/cramps. Denies itching, rash, or wounds. Denies dizziness, headaches, numbness or seizures. Denies swollen lymph nodes or glands, denies easy bruising or bleeding. Denies anxiety, depression, confusion, or decreased concentration.  Physical Exam:  Vital Signs for this encounter:  Blood pressure (S) (!) 170/102, pulse 81, temperature (!) 96.5 F (35.8 C), temperature source Tympanic, resp. rate 18, height 5\' 2"  (1.575 m), weight 142 lb (64.4 kg), last menstrual period 12/27/2016, SpO2 99 %. Body mass index is 25.97 kg/m. General: Alert, oriented, no acute distress.  HEENT: Normocephalic, atraumatic. Sclera anicteric.  Chest: Clear to auscultation bilaterally. No wheezes, rhonchi, or rales. Cardiovascular: Regular rate and rhythm, no murmurs, rubs, or gallops.   LABORATORY AND RADIOLOGIC DATA:  Outside medical records were reviewed to synthesize the above history, along with the history and physical obtained during the visit.   [place]gett on 11/17: 43.3  Lab Results  Component Value Date   WBC 5.5 05/20/2020   HGB 14.8 05/20/2020   HCT 43.2 05/20/2020   PLT 311 05/20/2020   GLUCOSE 88 06/30/2019   CHOL 213 (H) 05/20/2020   TRIG 94 05/20/2020   HDL 90 05/20/2020   LDLCALC 107 (H) 05/20/2020   ALT 21 07/10/2019   AST 17 07/10/2019   NA 140 06/30/2019   K 4.3 06/30/2019   CL 98 06/30/2019   CREATININE 0.79 06/30/2019   BUN 8 06/30/2019   CO2 21 06/30/2019   TSH 2.30 01/26/2016   Pelvic ultrasound  performed at [facility] [place] on 04/27/2020: Uterus measures 7.7 x 6.5 x 7.2 cm with an endometrial lining measuring 8.7 mm.  Calcific fied intramural fibroid measuring up to 3.6 cm, intramural fibroid measuring up to 3.2 cm noted.  1.1 simple right ovarian cyst noted, no other adnexal masses.

## 2020-06-04 NOTE — H&P (View-Only) (Signed)
GYNECOLOGIC ONCOLOGY NEW PATIENT CONSULTATION   Patient Name: Shannon Fields  Patient Age: 59 y.o. Date of Service: 06/04/20 Referring Provider: Dr. Adkins  Primary Care Provider: Chambliss, Marshall L, MD Consulting Provider: Thi Sisemore, MD   Assessment/Plan:  59yo with an episode of PMB several months ago with attempted office endometrial sampling that was aborted due to vascular LUS adhesions.  Reviewed work-up thus far in terms of PMB. Also discussed causes of postmenopausal bleeding. While not the most common, endometrial hyperplasia and malignancy are of greatest concern. While it is reassuring that she has not had further bleeding since the episode in November, her thickened endometrial lining on outside ultrasound raised the possibility of endometrial pathology such as a polyp. Given the limitations of office hysteroscopy and D&C in the event of significant bleeding, the next step in her work-up that I would recommend is to proceed with attempt at uterine sampling in the OR. While it is sometimes not feasible to perform endometrial sampling and ultimately we move forward with hysterectomy for definitive diagnosis and treatment, given her surgical history and in the setting of COVID, I strongly recommend attempt again at a more conservative approach (hysteroscopy and sampling). The patient voiced understanding of this recommendation and is amenable to moving forward with scheduling this procedure.   Given elevated blood pressure at her visit (asympomatic), I recommended that the patient reach out to her PCP to discuss the possibility of starting anti-hypertensive treatment.   We discussed plan for hysteroscopy, D&C with Myosure, possible ultrasound guidance on 1/13. The risks of surgery were discussed in detail and she understands these to include infection;, injury to adjacent organs such as bowel, bladder, blood vessels, ureters and nerves; bleeding which may require blood transfusion;  anesthesia risk; thromboembolic events; possible death; unforeseen complications; possible need for re-exploration; medical complications such as heart attack, stroke, pleural effusion and pneumonia. The patient will receive DVT and antibiotic prophylaxis as indicated. She voiced a clear understanding. She had the opportunity to ask questions.   Perioperative instructions will be reviewed with her by phone with Melissa Cross next week.   A copy of this note was sent to the patient's referring provider.   65 minutes of total time was spent for this patient encounter, including preparation, face-to-face counseling with the patient and coordination of care, and documentation of the encounter.  Zayed Griffie, MD  Division of Gynecologic Oncology  Department of Obstetrics and Gynecology  [facility]  ___________________________________________  Chief Complaint: Chief Complaint  Patient presents with  . PMB (postmenopausal bleeding)    History of Present Illness:  Shannon Fields is a 59 y.o. y.o. female who is seen in consultation at the request of Dr. Adkins for an evaluation of postmenopausal bleeding with uterine anatomy limiting possibility for sampling and thickened endometrial lining.  Patient reports last menses in mid 2020.  She has a history of uterine fibroids that have been followed for some time as well as a polyp that was resected after hysteroscopy and D&C in 2014.  Pathology from this procedure revealed benign endometrial polyp, no hyperplasia or malignancy.  In November 2021, the patient had viral pharyngitis (was negative for Covid and strep at the time).  Subsequently, she has what she describes as symptoms of bleeding like a menstrual cycle which started with discharge.  She then had cramping and multiple days of bleeding.  She denies any bleeding, discharge, or cramping since.  She called her gynecologist office and was   evaluated shortly after.  At  the end of November, the patient presented for a saline infusion sonogram with focal thickening of the endometrial lining noted at the fundus, concerning for possible recurrent polyp. In mid December, presented for an in office hysteroscopy and D&C.  Upon visualization, there was a thick wall of adhesions noted in the lower uterine segment that was unable to be maneuvered around.  Endometrial and endocervical sampling was performed with pathology revealing squamous fragments and scanty endocervical mucosa, no endometrial tissue identified.  She endorses a good appetite without any nausea or emesis.  She reports normal bowel and bladder function.  Her blood pressure has improved since she lost weight but has increased again since her illness in November.  She recently had a sleep study which showed relatively normal blood pressure at rest.  She has been checking her blood pressure at home in the morning about an hour after she wakes up.  It generally runs 120/80s-90s.  Her surgical history is notable for multiple hernia repairs including umbilical, epigastric, and left inguinal hernias.  Her left inguinal hernia has required multiple surgeries and she now has a mesh there as well as she thinks some mesh in her upper abdomen.  She had 2 C-sections as well as abdominoplasty with one of her hernia repairs.  Patient lives in [place] with her husband.  She endorses regular alcohol use, denies any tobacco or drug use.  She is worked for [employer] for some years, including in [employer], and currently works at [employer] (working remotely currently).  PAST MEDICAL HISTORY:  Past Medical History:  Diagnosis Date  . Diverticulosis   . History of diverticulitis   . Osteopenia   . PMB (postmenopausal bleeding)   . Uterine fibroid      PAST SURGICAL HISTORY:  Past Surgical History:  Procedure Laterality Date  . ABDOMINOPLASTY    . CESAREAN SECTION     x 2, BTL at time of c/s  . DILATION AND  CURETTAGE, DIAGNOSTIC / THERAPEUTIC    . EPIGASTRIC HERNIA REPAIR    . INGUINAL HERNIA REPAIR     x3  . UMBILICAL HERNIA REPAIR      OB/GYN HISTORY:  OB History  Gravida Para Term Preterm AB Living  3 2     1 2   SAB IAB Ectopic Multiple Live Births               # Outcome Date GA Lbr Len/2nd Weight Sex Delivery Anes PTL Lv  3 AB           2 Para           1 Para             Patient's last menstrual period was 12/27/2016.  Age at menarche: 71 Age at menopause: 33 Hx of HRT: Denies Hx of STDs: Denies Last pap: 04/2018 History of abnormal pap smears: Remote history of an abnormal Pap in the 80s  SCREENING STUDIES:  Last mammogram: 09/2018  Last colonoscopy: 2013 Last bone mineral density: 2018  MEDICATIONS: Outpatient Encounter Medications as of 06/04/2020  Medication Sig  . calcium-vitamin D (OSCAL WITH D) 500-200 MG-UNIT tablet Take 1 tablet by mouth 1 day or 1 dose.  Marland Kitchen HM GARLIC PO Take by mouth daily.  . Magnesium 125 MG CAPS Take 250 mg by mouth in the morning and at bedtime.   . Multiple Vitamin (MULTIVITAMIN) capsule Take 1 capsule by mouth daily.  . Omega 3  1200 MG CAPS Take 1,200 mg by mouth.  . desonide (DESOWEN) 0.05 % cream  (Patient not taking: Reported on 06/03/2020)  . Zoster Vaccine Adjuvanted [facility]) injection Inject 0.5 ml IM and Repeat in 2 months (Patient not taking: Reported on 06/03/2020)   No facility-administered encounter medications on file as of 06/04/2020.    ALLERGIES:  No Known Allergies   FAMILY HISTORY:  Family History  Problem Relation Age of Onset  . Hypertension Mother   . Fibroids Mother   . Lung cancer Mother   . Sudden death Neg Hx   . Diabetes Neg Hx   . Heart attack Neg Hx   . Hyperlipidemia Neg Hx   . Breast cancer Neg Hx   . Ovarian cancer Neg Hx      SOCIAL HISTORY:  Social Connections: Not on file    REVIEW OF SYSTEMS:  Denies appetite changes, fevers, chills, fatigue, unexplained weight changes. Denies hearing  loss, neck lumps or masses, mouth sores, ringing in ears or voice changes. Denies cough or wheezing.  Denies shortness of breath. Denies chest pain or palpitations. Denies leg swelling. Denies abdominal distention, pain, blood in stools, constipation, diarrhea, nausea, vomiting, or early satiety. Denies pain with intercourse, dysuria, frequency, hematuria or incontinence. Denies hot flashes, pelvic pain, vaginal bleeding or vaginal discharge.   Denies joint pain, back pain or muscle pain/cramps. Denies itching, rash, or wounds. Denies dizziness, headaches, numbness or seizures. Denies swollen lymph nodes or glands, denies easy bruising or bleeding. Denies anxiety, depression, confusion, or decreased concentration.  Physical Exam:  Vital Signs for this encounter:  Blood pressure (S) (!) 170/102, pulse 81, temperature (!) 96.5 F (35.8 C), temperature source Tympanic, resp. rate 18, height 5\' 2"  (1.575 m), weight 142 lb (64.4 kg), last menstrual period 12/27/2016, SpO2 99 %. Body mass index is 25.97 kg/m. General: Alert, oriented, no acute distress.  HEENT: Normocephalic, atraumatic. Sclera anicteric.  Chest: Clear to auscultation bilaterally. No wheezes, rhonchi, or rales. Cardiovascular: Regular rate and rhythm, no murmurs, rubs, or gallops.   LABORATORY AND RADIOLOGIC DATA:  Outside medical records were reviewed to synthesize the above history, along with the history and physical obtained during the visit.   [place] on 11/17: 43.3  Lab Results  Component Value Date   WBC 5.5 05/20/2020   HGB 14.8 05/20/2020   HCT 43.2 05/20/2020   PLT 311 05/20/2020   GLUCOSE 88 06/30/2019   CHOL 213 (H) 05/20/2020   TRIG 94 05/20/2020   HDL 90 05/20/2020   LDLCALC 107 (H) 05/20/2020   ALT 21 07/10/2019   AST 17 07/10/2019   NA 140 06/30/2019   K 4.3 06/30/2019   CL 98 06/30/2019   CREATININE 0.79 06/30/2019   BUN 8 06/30/2019   CO2 21 06/30/2019   TSH 2.30 01/26/2016   Pelvic ultrasound  performed at [facility] [place] on 04/27/2020: Uterus measures 7.7 x 6.5 x 7.2 cm with an endometrial lining measuring 8.7 mm.  Calcific fied intramural fibroid measuring up to 3.6 cm, intramural fibroid measuring up to 3.2 cm noted.  1.1 simple right ovarian cyst noted, no other adnexal masses.

## 2020-06-04 NOTE — Patient Instructions (Addendum)
Preparing for your Surgery  Plan for surgery on June 10, 2020 with Dr. Jeral Pinch at [facility]. You will be scheduled for a hysteroscopy, dilation and curettage of the uterus with Myosure, possible ultrasound guidance.   Pre-operative Testing -You will receive a phone call from presurgical testing at [facility] to arrange for a pre-operative appointment, lab appointment if needed, and COVID test. The COVID test normally happens 3 days prior to the surgery and they ask that you self quarantine after the test up until surgery to decrease chance of exposure.  -Bring your insurance card, copy of an advanced directive if applicable, medication list  -You should not be taking blood thinners or aspirin at least ten days prior to surgery unless instructed by your surgeon.  -Do not take supplements such as fish oil (omega 3), red yeast rice, turmeric before your surgery.   Day Before Surgery at [place] will be advised you can have clear liquids up until 3 hours before your surgery.    Your role in recovery Your role is to become active as soon as directed by your doctor, while still giving yourself time to heal.  Rest when you feel tired. You will be asked to do the following in order to speed your recovery:  - Cough and breathe deeply. This helps to clear and expand your lungs and can prevent pneumonia after surgery.  - [place].   Special Considerations -Your final pathology results from surgery should be available around one week after surgery and the results will be relayed to you when available.  Pain Management After Surgery -Make sure that you have Tylenol and Ibuprofen at home to use on a regular basis after surgery for pain control. We recommend alternating the medications every hour to six hours since they work differently and are processed in the body differently for pain relief.  Bowel Regimen -  It is  important to prevent constipation and drink adequate amounts of liquids.   Risks of Surgery Risks of surgery are low but include bleeding, infection, damage to surrounding structures, re-operation, blood clots, and very rarely death.  AFTER SURGERY INSTRUCTIONS  Return to work: 1-2 days if applicable  Activity: 1. Be up and out of the bed during the day.  Take a nap if needed.  You may walk up steps but be careful and use the hand rail.    2. No lifting or straining for 2 weeks over 10 pounds. No pushing, pulling, straining for 2 weeks.  3. No driving for minimum of 24 hours after the procedure.    4. You can shower as soon as the next day after surgery. No tub baths or submerging your body in water until cleared by your surgeon.  5. No sexual activity and nothing in the vagina for 2 weeks.  8. You may experience vaginal spotting after the procedure. The spotting is normal but if you experience heavy bleeding, call our office.  9. Take Tylenol or ibuprofen for pain.  Diet: 1. Low sodium Heart Healthy Diet is recommended but you are cleared to resume your normal (before surgery) diet after your procedure.  2. It is safe to use a laxative, such as Miralax or Colace, if you have difficulty moving your bowels.   Wound Care: 1. Keep clean and dry.  Shower daily.  Reasons to call the Doctor:  Fever - Oral temperature greater than 100.4 degrees Fahrenheit  Foul-smelling  vaginal discharge  Difficulty urinating  Nausea and vomiting  Increased pain at the site of the incision that is unrelieved with pain medicine.  Difficulty breathing with or without chest pain  New calf pain especially if only on one side  Sudden, continuing increased vaginal bleeding with or without clots.   Contacts: For questions or concerns you should contact:  Dr. Jeral Pinch at [phone]  Joylene John, NP at [phone]  After Hours: call [phone] and have the GYN Oncologist  paged/contacted (after 5 pm or on the weekends).  Messages sent via mychart are for non-urgent matters and are not responded to after hours so for urgent needs, please call the after hours number.   Dilation and Curettage or Vacuum Curettage  Dilation and curettage (D&C) and vacuum curettage are minor procedures. A D&C involves stretching (dilation) the cervix and scraping (curettage) the inside lining of the uterus (endometrium). During a D&C, tissue is gently scraped from the endometrium, starting from the top portion of the uterus down to the lowest part of the uterus (cervix). During a vacuum curettage, the lining and tissue in the uterus are removed with the use of gentle suction. Curettage may be performed to either diagnose or treat a problem. As a diagnostic procedure, curettage is performed to examine tissues from the uterus. A diagnostic curettage may be done if you have:  Irregular bleeding in the uterus.  Bleeding with the development of clots.  Spotting between menstrual periods.  Prolonged menstrual periods or other abnormal bleeding.  Bleeding after menopause.  No menstrual period (amenorrhea).  A change in size and shape of the uterus.  Abnormal endometrial cells discovered during a Pap test. As a treatment procedure, curettage may be performed for the following reasons:  Removal of an IUD (intrauterine device).  Removal of retained placenta after giving birth.  Abortion.  Miscarriage.  Removal of endometrial polyps.  Removal of uncommon types of noncancerous lumps (fibroids). Tell a health care provider about:  Any allergies you have, including allergies to prescribed medicine or latex.  All medicines you are taking, including vitamins, herbs, eye drops, creams, and over-the-counter medicines. This is especially important if you take any blood-thinning medicine. Bring a list of all of your medicines to your appointment.  Any problems you or family members  have had with anesthetic medicines.  Any blood disorders you have.  Any surgeries you have had.  Your medical history and any medical conditions you have.  Whether you are pregnant or may be pregnant.  Recent vaginal infections you have had.  Recent menstrual periods, bleeding problems you have had, and what form of birth control (contraception) you use. What are the risks? Generally, this is a safe procedure. However, problems may occur, including:  Infection.  Heavy vaginal bleeding.  Allergic reactions to medicines.  Damage to the cervix or other structures or organs.  Development of scar tissue (adhesions) inside the uterus, which can cause abnormal amounts of menstrual bleeding. This may make it harder to get pregnant in the future.  A hole (perforation) or puncture in the uterine wall. This is rare. Medicines  Ask your health care provider about: ? Changing or stopping your regular medicines. This is especially important if you are taking diabetes medicines or blood thinners. ? Taking medicines such as aspirin and ibuprofen. These medicines can thin your blood. Do not take these medicines before your procedure if your health care provider instructs you not to.  You may be given antibiotic medicine to  help prevent infection. General instructions  For 24 hours before your procedure, do not: ? Douche. ? Use tampons. ? Use medicines, creams, or suppositories in the vagina. ? Have sexual intercourse.  You may be given a pregnancy test on the day of the procedure.  Plan to have someone take you home from the hospital or clinic.  You may have a blood or urine sample taken.  If you will be going home right after the procedure, plan to have someone with you for 24 hours. What happens during the procedure?  To reduce your risk of infection: ? Your health care team will wash or sanitize their hands. ? Your skin will be washed with soap.  An IV tube will be inserted  into one of your veins.  You will be given one of the following: ? A medicine that numbs the area in and around the cervix (local anesthetic). ? A medicine to make you fall asleep (general anesthetic).  You will lie down on your back, with your feet in foot rests (stirrups).  The size and position of your uterus will be checked.  A lubricated instrument (speculum or Sims retractor) will be inserted into the back side of your vagina. The speculum will be used to hold apart the walls of your vagina so your health care provider can see your cervix.  A tool (tenaculum) will be attached to the lip of the cervix to stabilize it.  Your cervix will be softened and dilated. This may be done by: ? Taking a medicine. ? Having tapered dilators or thin rods (laminaria) or gradual widening instruments (tapered dilators) inserted into your cervix.  A small, sharp, curved instrument (curette) will be used to scrape a small amount of tissue or cells from the endometrium or cervical canal. In some cases, gentle suction is applied with the curette. The curette will then be removed. The cells will be taken to a lab for testing. The procedure may vary among health care providers and hospitals. What happens after the procedure?  You may have mild cramping, backache, pain, and light bleeding or spotting. You may pass small blood clots from your vagina.  You may have to wear compression stockings. These stockings help to prevent blood clots and reduce swelling in your legs.  Your blood pressure, heart rate, breathing rate, and blood oxygen level will be monitored until the medicines you were given have worn off. Summary  Dilation and curettage (D&C) involves stretching (dilation) the cervix and scraping (curettage) the inside lining of the uterus (endometrium).  After the procedure, you may have mild cramping, backache, pain, and light bleeding or spotting. You may pass small blood clots from your  vagina.  Plan to have someone take you home from the hospital or clinic. This information is not intended to replace advice given to you by your health care provider. Make sure you discuss any questions you have with your health care provider. Document Revised: 04/27/2017 Document Reviewed: 01/30/2016 Elsevier Patient Education  2020 [employer].

## 2020-06-07 ENCOUNTER — Other Ambulatory Visit (HOSPITAL_COMMUNITY)
Admission: RE | Admit: 2020-06-07 | Discharge: 2020-06-07 | Disposition: A | Discharge status: Home / Self Care | Payer: 59 | Source: Ambulatory Visit | Attending: Gynecologic Oncology | Admitting: Gynecologic Oncology

## 2020-06-07 DIAGNOSIS — Z01812 Encounter for preprocedural laboratory examination: Secondary | ICD-10-CM | POA: Diagnosis not present

## 2020-06-07 DIAGNOSIS — Z20822 Contact with and (suspected) exposure to covid-19: Secondary | ICD-10-CM | POA: Diagnosis not present

## 2020-06-07 LAB — SARS CORONAVIRUS 2 (TAT 6-24 HRS): SARS Coronavirus 2: NEGATIVE

## 2020-06-08 ENCOUNTER — Encounter (HOSPITAL_BASED_OUTPATIENT_CLINIC_OR_DEPARTMENT_OTHER): Payer: Self-pay | Admitting: Gynecologic Oncology

## 2020-06-09 ENCOUNTER — Telehealth: Payer: Self-pay

## 2020-06-09 ENCOUNTER — Encounter (HOSPITAL_BASED_OUTPATIENT_CLINIC_OR_DEPARTMENT_OTHER): Payer: Self-pay | Admitting: Gynecologic Oncology

## 2020-06-09 ENCOUNTER — Other Ambulatory Visit: Payer: Self-pay

## 2020-06-09 NOTE — Telephone Encounter (Signed)
Shannon Fields in [facility] just finished reviewing pre-op review with patient at 1530.  Will not call pat again to review instructions.

## 2020-06-09 NOTE — Progress Notes (Signed)
Spoke w/ via phone for pre-op interview--- PT Lab needs dos---- Istat            Lab results------ no  COVID test ------ done 06-07-2020 negative result in epic Arrive at ------- 0930 on 06-10-2020 NPO after MN NO Solid Food.  Clear liquids from MN until--- 0830 Medications to take morning of surgery ----- NONE Diabetic medication ----- n/a Patient Special Instructions ----- n/a Pre-Op special Istructions ----- n/a Patient verbalized understanding of instructions that were given at this phone interview. Patient denies shortness of breath, chest pain, fever, cough at this phone interview.

## 2020-06-10 ENCOUNTER — Other Ambulatory Visit: Payer: Self-pay

## 2020-06-10 ENCOUNTER — Ambulatory Visit (HOSPITAL_BASED_OUTPATIENT_CLINIC_OR_DEPARTMENT_OTHER): Payer: 59 | Admitting: Certified Registered Nurse Anesthetist

## 2020-06-10 ENCOUNTER — Encounter (HOSPITAL_BASED_OUTPATIENT_CLINIC_OR_DEPARTMENT_OTHER)
Admission: RE | Disposition: A | Discharge status: Home / Self Care | Payer: Self-pay | Source: Home / Self Care | Attending: Gynecologic Oncology

## 2020-06-10 ENCOUNTER — Ambulatory Visit (HOSPITAL_COMMUNITY): Payer: 59

## 2020-06-10 ENCOUNTER — Ambulatory Visit (HOSPITAL_BASED_OUTPATIENT_CLINIC_OR_DEPARTMENT_OTHER)
Admission: RE | Admit: 2020-06-10 | Discharge: 2020-06-10 | Disposition: A | Discharge status: Home / Self Care | Payer: 59 | Attending: Gynecologic Oncology | Admitting: Gynecologic Oncology

## 2020-06-10 ENCOUNTER — Encounter (HOSPITAL_BASED_OUTPATIENT_CLINIC_OR_DEPARTMENT_OTHER): Payer: Self-pay | Admitting: Gynecologic Oncology

## 2020-06-10 ENCOUNTER — Other Ambulatory Visit (HOSPITAL_BASED_OUTPATIENT_CLINIC_OR_DEPARTMENT_OTHER): Payer: Self-pay | Admitting: Gynecologic Oncology

## 2020-06-10 DIAGNOSIS — N950 Postmenopausal bleeding: Secondary | ICD-10-CM | POA: Diagnosis not present

## 2020-06-10 DIAGNOSIS — Z8249 Family history of ischemic heart disease and other diseases of the circulatory system: Secondary | ICD-10-CM | POA: Diagnosis not present

## 2020-06-10 DIAGNOSIS — Z9889 Other specified postprocedural states: Secondary | ICD-10-CM | POA: Diagnosis not present

## 2020-06-10 DIAGNOSIS — N841 Polyp of cervix uteri: Secondary | ICD-10-CM | POA: Insufficient documentation

## 2020-06-10 DIAGNOSIS — Z842 Family history of other diseases of the genitourinary system: Secondary | ICD-10-CM | POA: Diagnosis not present

## 2020-06-10 DIAGNOSIS — Z86018 Personal history of other benign neoplasm: Secondary | ICD-10-CM | POA: Insufficient documentation

## 2020-06-10 DIAGNOSIS — I10 Essential (primary) hypertension: Secondary | ICD-10-CM | POA: Diagnosis not present

## 2020-06-10 DIAGNOSIS — Z801 Family history of malignant neoplasm of trachea, bronchus and lung: Secondary | ICD-10-CM | POA: Diagnosis not present

## 2020-06-10 DIAGNOSIS — N840 Polyp of corpus uteri: Secondary | ICD-10-CM | POA: Insufficient documentation

## 2020-06-10 DIAGNOSIS — Z79899 Other long term (current) drug therapy: Secondary | ICD-10-CM | POA: Insufficient documentation

## 2020-06-10 DIAGNOSIS — M859 Disorder of bone density and structure, unspecified: Secondary | ICD-10-CM | POA: Diagnosis not present

## 2020-06-10 HISTORY — DX: Personal history of other diseases of the digestive system: Z87.19

## 2020-06-10 HISTORY — DX: Presence of spectacles and contact lenses: Z97.3

## 2020-06-10 HISTORY — PX: DILATATION & CURETTAGE/HYSTEROSCOPY WITH MYOSURE: SHX6511

## 2020-06-10 HISTORY — DX: Elevated blood-pressure reading, without diagnosis of hypertension: R03.0

## 2020-06-10 LAB — POCT I-STAT, CHEM 8
BUN: 14 mg/dL (ref 6–20)
Calcium, Ion: 1.22 mmol/L (ref 1.15–1.40)
Chloride: 104 mmol/L (ref 98–111)
Creatinine, Ser: 0.8 mg/dL (ref 0.44–1.00)
Glucose, Bld: 112 mg/dL — ABNORMAL HIGH (ref 70–99)
HCT: 44 % (ref 36.0–46.0)
Hemoglobin: 15 g/dL (ref 12.0–15.0)
Potassium: 3.4 mmol/L — ABNORMAL LOW (ref 3.5–5.1)
Sodium: 141 mmol/L (ref 135–145)
TCO2: 27 mmol/L (ref 22–32)

## 2020-06-10 SURGERY — DILATATION & CURETTAGE/HYSTEROSCOPY WITH MYOSURE
Anesthesia: General

## 2020-06-10 MED ORDER — FENTANYL CITRATE (PF) 100 MCG/2ML IJ SOLN
INTRAMUSCULAR | Status: AC
  Filled 2020-06-10: qty 2

## 2020-06-10 MED ORDER — ACETAMINOPHEN 500 MG PO TABS
1000.0000 mg | ORAL_TABLET | ORAL | Status: AC
  Administered 2020-06-10: 1000 mg via ORAL

## 2020-06-10 MED ORDER — DEXAMETHASONE SODIUM PHOSPHATE 10 MG/ML IJ SOLN
INTRAMUSCULAR | Status: DC | PRN
  Administered 2020-06-10: 10 mg via INTRAVENOUS

## 2020-06-10 MED ORDER — GABAPENTIN 300 MG PO CAPS
ORAL_CAPSULE | ORAL | Status: AC
  Filled 2020-06-10: qty 1

## 2020-06-10 MED ORDER — MEPERIDINE HCL 25 MG/ML IJ SOLN: 6.2500 mg | INTRAMUSCULAR | Status: DC | PRN

## 2020-06-10 MED ORDER — IBUPROFEN 800 MG PO TABS: 800.0000 mg | ORAL_TABLET | Freq: Three times a day (TID) | ORAL | 0 refills | Status: DC | PRN

## 2020-06-10 MED ORDER — ONDANSETRON HCL 4 MG/2ML IJ SOLN
INTRAMUSCULAR | Status: DC | PRN
  Administered 2020-06-10: 4 mg via INTRAVENOUS

## 2020-06-10 MED ORDER — KETOROLAC TROMETHAMINE 30 MG/ML IJ SOLN: 30.0000 mg | Freq: Once | INTRAMUSCULAR | Status: DC | PRN

## 2020-06-10 MED ORDER — OXYCODONE HCL 5 MG PO TABS: 5.0000 mg | ORAL_TABLET | Freq: Once | ORAL | Status: DC | PRN

## 2020-06-10 MED ORDER — PROPOFOL 10 MG/ML IV BOLUS
INTRAVENOUS | Status: AC
  Filled 2020-06-10: qty 40

## 2020-06-10 MED ORDER — ACETAMINOPHEN 500 MG PO TABS
ORAL_TABLET | ORAL | Status: AC
  Filled 2020-06-10: qty 2

## 2020-06-10 MED ORDER — FENTANYL CITRATE (PF) 250 MCG/5ML IJ SOLN
INTRAMUSCULAR | Status: DC | PRN
  Administered 2020-06-10: 25 ug via INTRAVENOUS
  Administered 2020-06-10: 50 ug via INTRAVENOUS
  Administered 2020-06-10: 25 ug via INTRAVENOUS

## 2020-06-10 MED ORDER — KETOROLAC TROMETHAMINE 30 MG/ML IJ SOLN
INTRAMUSCULAR | Status: DC | PRN
  Administered 2020-06-10: 30 mg via INTRAVENOUS

## 2020-06-10 MED ORDER — PROPOFOL 10 MG/ML IV BOLUS
INTRAVENOUS | Status: DC | PRN
  Administered 2020-06-10: 150 mg via INTRAVENOUS

## 2020-06-10 MED ORDER — SENNOSIDES-DOCUSATE SODIUM 8.6-50 MG PO TABS: 1.0000 | ORAL_TABLET | Freq: Every day | ORAL | 0 refills | Status: DC

## 2020-06-10 MED ORDER — LACTATED RINGERS IV SOLN: INTRAVENOUS | Status: DC

## 2020-06-10 MED ORDER — LIDOCAINE 2% (20 MG/ML) 5 ML SYRINGE
INTRAMUSCULAR | Status: DC | PRN
  Administered 2020-06-10: 60 mg via INTRAVENOUS

## 2020-06-10 MED ORDER — SCOPOLAMINE 1 MG/3DAYS TD PT72
MEDICATED_PATCH | TRANSDERMAL | Status: AC
  Filled 2020-06-10: qty 1

## 2020-06-10 MED ORDER — OXYCODONE HCL 5 MG/5ML PO SOLN: 5.0000 mg | Freq: Once | ORAL | Status: DC | PRN

## 2020-06-10 MED ORDER — DEXAMETHASONE SODIUM PHOSPHATE 10 MG/ML IJ SOLN: 4.0000 mg | INTRAMUSCULAR | Status: DC

## 2020-06-10 MED ORDER — SODIUM CHLORIDE 0.9 % IR SOLN
Status: DC | PRN
  Administered 2020-06-10: 3000 mL

## 2020-06-10 MED ORDER — MIDAZOLAM HCL 2 MG/2ML IJ SOLN
INTRAMUSCULAR | Status: AC
  Filled 2020-06-10: qty 2

## 2020-06-10 MED ORDER — GABAPENTIN 300 MG PO CAPS
300.0000 mg | ORAL_CAPSULE | ORAL | Status: AC
  Administered 2020-06-10: 300 mg via ORAL

## 2020-06-10 MED ORDER — CHLOROPROCAINE HCL 1 % IJ SOLN
INTRAMUSCULAR | Status: DC | PRN
  Administered 2020-06-10: 10 mL

## 2020-06-10 MED ORDER — ACETAMINOPHEN 325 MG PO TABS: 650.0000 mg | ORAL_TABLET | Freq: Four times a day (QID) | ORAL | 0 refills | Status: DC | PRN

## 2020-06-10 MED ORDER — SCOPOLAMINE 1 MG/3DAYS TD PT72
1.0000 | MEDICATED_PATCH | TRANSDERMAL | Status: DC
  Administered 2020-06-10: 1.5 mg via TRANSDERMAL

## 2020-06-10 MED ORDER — MIDAZOLAM HCL 5 MG/5ML IJ SOLN
INTRAMUSCULAR | Status: DC | PRN
  Administered 2020-06-10: 2 mg via INTRAVENOUS

## 2020-06-10 MED ORDER — CELECOXIB 200 MG PO CAPS: 400.0000 mg | ORAL_CAPSULE | ORAL | Status: DC

## 2020-06-10 MED ORDER — HYDROMORPHONE HCL 1 MG/ML IJ SOLN: 0.2500 mg | INTRAMUSCULAR | Status: DC | PRN

## 2020-06-10 MED ORDER — CELECOXIB 200 MG PO CAPS
ORAL_CAPSULE | ORAL | Status: AC
  Filled 2020-06-10: qty 2

## 2020-06-10 MED ORDER — PROMETHAZINE HCL 25 MG/ML IJ SOLN
6.2500 mg | INTRAMUSCULAR | Status: DC | PRN
Start: 2020-06-10 — End: 2020-06-10

## 2020-06-10 SURGICAL SUPPLY — 12 items
COVER WAND RF STERILE (DRAPES) ×2 IMPLANT
DEVICE MYOSURE REACH (MISCELLANEOUS) ×1 IMPLANT
GAUZE 4X4 16PLY RFD (DISPOSABLE) ×2 IMPLANT
GLOVE SURG ENC MOIS LTX SZ6 (GLOVE) ×2 IMPLANT
GOWN STRL REUS W/TWL LRG LVL3 (GOWN DISPOSABLE) ×3 IMPLANT
IV NS IRRIG 3000ML ARTHROMATIC (IV SOLUTION) ×2 IMPLANT
KIT TURNOVER CYSTO (KITS) ×2 IMPLANT
PACK VAGINAL MINOR WOMEN LF (CUSTOM PROCEDURE TRAY) ×2 IMPLANT
PAD OB MATERNITY 4.3X12.25 (PERSONAL CARE ITEMS) ×2 IMPLANT
PAD PREP 24X48 CUFFED NSTRL (MISCELLANEOUS) ×2 IMPLANT
SEAL ROD LENS SCOPE MYOSURE (ABLATOR) ×1 IMPLANT
TOWEL OR 17X26 10 PK STRL BLUE (TOWEL DISPOSABLE) ×2 IMPLANT

## 2020-06-10 NOTE — Discharge Instructions (Signed)
06/10/2020  Return to work: 1-4  Days.  Activity: 1. Be up and out of the bed during the day.  Take a nap if needed.  You may walk up steps but be careful and use the hand rail.    2. No lifting or straining for 2-4 weeks.  3. No driving until you feel that you can brake safely and are not using narcotics.  4. Shower daily.  Use soap and water on your incision and pat dry; don't rub. Don't shower for at least 2 weeks.  5. No sexual activity and nothing in the vagina for 2-4 weeks.  Medications:  - Take ibuprofen and tylenol first line for pain control. Take these regularly (every 6 hours) to decrease the build up of pain.  - Sennakot will reduce the likelihood of constipation. If this causes diarrhea, stop its use.  Diet: 1. Low sodium Heart Healthy Diet is recommended.  2. It is safe to use a laxative if you have difficulty moving your bowels.   Wound Care: 1. Keep clean and dry.  Shower daily.  Reasons to call the Doctor:   Fever - Oral temperature greater than 100.4 degrees Fahrenheit  Foul-smelling vaginal discharge  Difficulty urinating  Nausea and vomiting  Difficulty breathing with or without chest pain  New calf pain especially if only on one side  Sudden, continuing increased vaginal bleeding with or without clots.   Follow-up: 1. See Jeral Pinch in 3 weeks. You will have a phone call once pathology is back.  Contacts: For questions or concerns you should contact:  Dr. Jeral Pinch at [phone] After hours and on week-ends call [phone] and ask to speak to the physician on call for Gynecologic Oncology  DISCHARGE INSTRUCTIONS: D&C / D&E The following instructions have been prepared to help you care for yourself upon your return home.   Personal hygiene:  Use sanitary pads for vaginal drainage, not tampons.  Shower the day after your procedure.  NO tub baths, pools or Jacuzzis for 2-3 weeks.  Wipe front to back after using the  bathroom.  Activity and limitations:  Do NOT drive or operate any equipment for 24 hours. The effects of anesthesia are still present and drowsiness may result.  Do NOT rest in bed all day.  Walking is encouraged.  Walk up and down stairs slowly.  You may resume your normal activity in one to two days or as indicated by your physician.  Sexual activity: NO intercourse for at least 2 weeks after the procedure, or as indicated by your physician.  Diet: Eat a light meal as desired this evening. You may resume your usual diet tomorrow.  Return to work: You may resume your work activities in one to two days or as indicated by your doctor.  What to expect after your surgery: Expect to have vaginal bleeding/discharge for 2-3 days and spotting for up to 10 days. It is not unusual to have soreness for up to 1-2 weeks. You may have a slight burning sensation when you urinate for the first day. Mild cramps may continue for a couple of days. You may have a regular period in 2-6 weeks.  Call your doctor for any of the following:  Excessive vaginal bleeding, saturating and changing one pad every hour.  Inability to urinate 6 hours after discharge from hospital.  Pain not relieved by pain medication.  Fever of 100.4 F or greater.  Unusual vaginal discharge or odor.   Post Anesthesia  Home Care Instructions  Activity: Get plenty of rest for the remainder of the day. A responsible individual must stay with you for 24 hours following the procedure.  For the next 24 hours, DO NOT: -Drive a car -Paediatric nurse -Drink alcoholic beverages -Take any medication unless instructed by your physician -Make any legal decisions or sign important papers.  Meals: Start with liquid foods such as gelatin or soup. Progress to regular foods as tolerated. Avoid greasy, spicy, heavy foods. If nausea and/or vomiting occur, drink only clear liquids until the nausea and/or vomiting subsides. Call your  physician if vomiting continues.  Special Instructions/Symptoms: Your throat may feel dry or sore from the anesthesia or the breathing tube placed in your throat during surgery. If this causes discomfort, gargle with warm salt water. The discomfort should disappear within 24 hours.  If you had a scopolamine patch placed behind your ear for the management of post- operative nausea and/or vomiting:  1. The medication in the patch is effective for 72 hours, after which it should be removed.  Wrap patch in a tissue and discard in the trash. Wash hands thoroughly with soap and water. 2. You may remove the patch earlier than 72 hours if you experience unpleasant side effects which may include dry mouth, dizziness or visual disturbances. 3. Avoid touching the patch. Wash your hands with soap and water after contact with the patch.  Postmenopausal Bleeding Postmenopausal bleeding is any bleeding that occurs after menopause. Menopause is a time in a woman's life when monthly periods stop. Any type of bleeding after menopause should be checked by your doctor. Treatment will depend on the cause. This kind of bleeding can be caused by:  Taking hormones during menopause.  Low or high amounts of female hormones in the body. This can cause the lining of the womb (uterus) to become too thin or too thick.  Cancer.  Growths in the womb that are not cancer. Follow these instructions at home:  Watch for any changes in your symptoms. Let your doctor know about them.  Avoid using tampons and douches as told by your doctor.  Change your pads regularly.  Get regular pelvic exams. This includes Pap tests.  Take iron pills as told by your doctor.  Take over-the-counter and prescription medicines only as told by your doctor.  Keep all follow-up visits.   Contact a doctor if:  You have new bleeding from the vagina after menopause.  You have pain in your belly (abdomen). Get help right away if:  You  have a fever or chills.  You have very bad pain with bleeding.  You have clumps of blood (blood clots) coming from your vagina.  You have a lot of bleeding, and: ? You use more than 1 pad an hour. ? This kind of bleeding has never happened before.  You have headaches.  You feel dizzy or you feel like you are going to pass out (faint). Summary  Any type of bleeding after menopause should be checked by your doctor.  Avoid using tampons or douches.  Get regular pelvic exams. This includes Pap tests.  Contact a doctor if you have new bleeding or pain in your belly.  Watch for any changes in your symptoms. Let your doctor know about them. This information is not intended to replace advice given to you by your health care provider. Make sure you discuss any questions you have with your health care provider. Document Revised: 10/30/2019 Document Reviewed: 10/30/2019 Elsevier Patient  Education  2021 [employer].

## 2020-06-10 NOTE — Anesthesia Procedure Notes (Signed)
Procedure Name: LMA Insertion Date/Time: 06/10/2020 10:39 AM Performed by: Myna Bright, CRNA Pre-anesthesia Checklist: Patient identified, Emergency Drugs available, Suction available and Patient being monitored Patient Re-evaluated:Patient Re-evaluated prior to induction Oxygen Delivery Method: Circle system utilized Preoxygenation: Pre-oxygenation with 100% oxygen Induction Type: IV induction Ventilation: Mask ventilation without difficulty LMA: LMA inserted LMA Size: 4.0 Number of attempts: 1 Placement Confirmation: positive ETCO2 and breath sounds checked- equal and bilateral Tube secured with: Tape Dental Injury: Teeth and Oropharynx as per pre-operative assessment

## 2020-06-10 NOTE — Anesthesia Preprocedure Evaluation (Addendum)
Anesthesia Evaluation  Patient identified by MRN, date of birth, ID band Patient awake    Reviewed: Allergy & Precautions, NPO status , Patient's Chart, lab work & pertinent test results  Airway Mallampati: II  TM Distance: >3 FB Neck ROM: Full    Dental no notable dental hx.    Pulmonary neg pulmonary ROS,    Pulmonary exam normal breath sounds clear to auscultation       Cardiovascular hypertension, Normal cardiovascular exam Rhythm:Regular Rate:Normal     Neuro/Psych negative neurological ROS  negative psych ROS   GI/Hepatic Neg liver ROS, GERD  Controlled,  Endo/Other  negative endocrine ROS  Renal/GU negative Renal ROS Bladder dysfunction      Musculoskeletal  (+) Arthritis , Osteoarthritis,    Abdominal   Peds  Hematology negative hematology ROS (+)   Anesthesia Other Findings   Reproductive/Obstetrics negative OB ROS                           Anesthesia Physical Anesthesia Plan  ASA: II  Anesthesia Plan: General   Post-op Pain Management:    Induction: Intravenous  PONV Risk Score and Plan: 3 and Ondansetron, Dexamethasone, Midazolam and Treatment may vary due to age or medical condition  Airway Management Planned: LMA  Additional Equipment: None  Intra-op Plan:   Post-operative Plan: Extubation in OR  Informed Consent: I have reviewed the patients History and Physical, chart, labs and discussed the procedure including the risks, benefits and alternatives for the proposed anesthesia with the patient or authorized representative who has indicated his/her understanding and acceptance.     Dental advisory given  Plan Discussed with: CRNA  Anesthesia Plan Comments:        Anesthesia Quick Evaluation

## 2020-06-10 NOTE — Anesthesia Postprocedure Evaluation (Signed)
Anesthesia Post Note  Patient: Shannon Fields  Procedure(s) Performed: DILATATION & CURETTAGE/HYSTEROSCOPY WITH MYOSURE (N/A )     Patient location during evaluation: PACU Anesthesia Type: General Level of consciousness: awake and alert, oriented and patient cooperative Pain management: pain level controlled Vital Signs Assessment: post-procedure vital signs reviewed and stable Respiratory status: spontaneous breathing, nonlabored ventilation and respiratory function stable Cardiovascular status: blood pressure returned to baseline and stable Postop Assessment: no apparent nausea or vomiting Anesthetic complications: no   No complications documented.  Last Vitals:  Vitals:   06/10/20 1209 06/10/20 1211  BP: 108/78 (!) 148/69  Pulse: (!) 55   Resp: 15   Temp: (!) 36.3 C   SpO2: 100%     Last Pain:  Vitals:   06/10/20 1209  TempSrc:   PainSc: 0-No pain                 Pervis Hocking

## 2020-06-10 NOTE — Anesthesia Procedure Notes (Signed)
Procedure Name: LMA Insertion Date/Time: 06/10/2020 10:39 AM Performed by: Javarious Elsayed M, CRNA Pre-anesthesia Checklist: Patient identified, Emergency Drugs available, Suction available and Patient being monitored Patient Re-evaluated:Patient Re-evaluated prior to induction Oxygen Delivery Method: Circle system utilized Preoxygenation: Pre-oxygenation with 100% oxygen Induction Type: IV induction Ventilation: Mask ventilation without difficulty LMA: LMA inserted LMA Size: 4.0 Number of attempts: 1 Placement Confirmation: positive ETCO2 and breath sounds checked- equal and bilateral Tube secured with: Tape Dental Injury: Teeth and Oropharynx as per pre-operative assessment        

## 2020-06-10 NOTE — Transfer of Care (Signed)
Immediate Anesthesia Transfer of Care Note  Patient: Shannon Fields  Procedure(s) Performed: DILATATION & CURETTAGE/HYSTEROSCOPY WITH MYOSURE (N/A )  Patient Location: PACU  Anesthesia Type:General  Level of Consciousness: awake, alert , oriented and patient cooperative  Airway & Oxygen Therapy: Patient Spontanous Breathing and Patient connected to face mask oxygen  Post-op Assessment: Report given to RN, Post -op Vital signs reviewed and stable and Patient moving all extremities  Post vital signs: Reviewed and stable  Last Vitals:  Vitals Value Taken Time  BP 155/76 06/10/20 1125  Temp    Pulse 65 06/10/20 1128  Resp 20 06/10/20 1128  SpO2 100 % 06/10/20 1128  Vitals shown include unvalidated device data.  Last Pain:  Vitals:   06/10/20 1022  TempSrc: Oral  PainSc: 0-No pain      Patients Stated Pain Goal: 4 (38/75/64 3329)  Complications: No complications documented.

## 2020-06-10 NOTE — Op Note (Signed)
PATIENT: Shannon Fields DATE: 06/10/20   Preop Diagnosis: PMB, suspected uterine polyp, LUS adhesions  Postoperative Diagnosis: PMB, endometrial polyp, endocervical adhesions  Surgery: EUA, hysteroscopy, polypectomy with Myosure  Surgeons:  Jeral Pinch MD  Assistant: none  Anesthesia: LMA  Estimated blood loss: <10 ml  IVF:  See I&O flowsheet   Urine output: none, patient voided before surgery   Complications: None apparent  Pathology: endometrial polyp, endocervical adhesions  Hysteroscopy fluid deficit: 280cc  Operative findings: On EUA, small mobile uterus. Significant and moderately vascular adhesions versus polypoid tissue noted within the lower cervical os. No apparent opening between this tissue and the surrounding cervical os was able to be identified. A smaller diagnostic hysteroscope was then used an a small channel was identified and able to be traversed along the posterior left cervix. The cavity was mostly atrophic appearing with a 1-1.5cm polyp projecting from the uterine fundus. Bilateral ostia appreciated.  Procedure: The patient was identified in the preoperative holding area. Informed consent was signed on the chart. Patient was seen history was reviewed and exam was performed.   The patient was then taken to the operating room and placed in the supine position with SCD hose on. General anesthesia was then induced without difficulty. She was then placed in the dorsolithotomy position. The perineum was prepped with Betadine. The vagina was prepped with Betadine. The patient was then draped after the prep was dried.  Timeout was performed the patient, procedure, antibiotic, allergy, and length of procedure.   The speculum was placed in the posterior vagina. The single tooth tenaculum was placed on the anterior lip of the cervix. The uterine sound could not be passed through the internal os.   The hysteroscope was inserted and significant endocervical  adhesions were noted without a clear entry path to access the endometrial cavity. A smaller diagnostic hysteroscopy (3.36mm) was then used and access to the endometrial cavity was obtained along the posterior left aspect of the cervical os. The cavity was surveyed and pictures taken. Given findings of a fundal polyp, the diagnostic scope was exchanged for the operative hysteroscope. Once the endometrial cavity was accessed with the operative hysteroscope, the Myosure Reach was then used to resect the polyp. The tissue was collected to be sent to pathology.  Attention was then turned back to the endocervical adhesions and the Myosure Reach was used to take a biopsy of this tissue to send to pathology.   The tenaculum was removed and hemostasis was observed.   The vagina was irrigated.  All instrument, suture, laparotomy, Ray-Tec, and needle counts were correct x2.  The patient tolerated the procedure well and was taken recovery room in stable condition.   Jeral Pinch MD Gynecologic Oncology

## 2020-06-10 NOTE — Interval H&P Note (Signed)
History and Physical Interval Note:  06/10/2020 9:38 AM  Shannon Fields  has presented today for surgery, with the diagnosis of POST MENOPAUSAL BLEEDING.  The various methods of treatment have been discussed with the patient and family. After consideration of risks, benefits and other options for treatment, the patient has consented to  Procedure(s): [place], POSSIBLE ULTRASOUND (N/A) as a surgical intervention.  The patient's history has been reviewed, patient examined, no change in status, stable for surgery.  I have reviewed the patient's chart and labs.  Questions were answered to the patient's satisfaction.     Lafonda Mosses

## 2020-06-11 ENCOUNTER — Encounter (HOSPITAL_BASED_OUTPATIENT_CLINIC_OR_DEPARTMENT_OTHER): Payer: Self-pay | Admitting: Gynecologic Oncology

## 2020-06-11 ENCOUNTER — Telehealth: Payer: Self-pay

## 2020-06-11 ENCOUNTER — Telehealth: Payer: Self-pay | Admitting: Gynecologic Oncology

## 2020-06-11 LAB — SURGICAL PATHOLOGY

## 2020-06-11 NOTE — Telephone Encounter (Signed)
Called the patient to discuss pathology results from yesterday.  No answer, left message with callback requested.  Jeral Pinch MD Gynecologic Oncology

## 2020-06-11 NOTE — Telephone Encounter (Signed)
Tell her the noted a polyp and no cancer was identified on surgical pathology from 06-10-20 per Melissa Cross,NP

## 2020-06-11 NOTE — Telephone Encounter (Signed)
Shannon Fields states that she is eating,drinking, and urinating well. Passing gas.  She has not begun the senokot-s.  She states that she has a high fiber diet that keeps her regular.  She will use senokot-s if no good BM in 24 hours. Afebrile. Light vaginal spotting.  Using Tylenol prn for discomfort. Told her the results of the surgical pathology as noted below by [facility]. Pt aware of post op appointments as well as the office number [phone] and after hours number [phone] to call if she has any questions or concerns

## 2020-06-11 NOTE — Telephone Encounter (Signed)
Called Ms Cush and there was no answer.  Her voicemail was full.

## 2020-06-28 ENCOUNTER — Other Ambulatory Visit: Payer: Self-pay | Admitting: Family Medicine

## 2020-06-28 DIAGNOSIS — R030 Elevated blood-pressure reading, without diagnosis of hypertension: Secondary | ICD-10-CM

## 2020-06-28 NOTE — Progress Notes (Signed)
Gynecologic Oncology Return Clinic Visit  06/29/20  Reason for Visit: post-op follow-up  Treatment History: 1/13: EUA, hysteroscopy, polypectomy with Myosure Pathology - benign endometrial polyp  Interval History: Patient presents today for postoperative follow-up.  She reports doing very well.  She had minimal spotting for a day or 2 after the surgery.  She denies any further vaginal bleeding or discharge.  She endorses a good appetite without nausea or emesis.  She endorses normal bowel and bladder function.  Past Medical/Surgical History: Past Medical History:  Diagnosis Date  . Diverticulosis   . History of diverticulitis 06/2019  . History of gastroesophageal reflux (GERD)   . Osteopenia   . PMB (postmenopausal bleeding)   . Uterine fibroid   . Uterine polyp 07/10/2012  . Wears glasses   . White coat syndrome without diagnosis of hypertension     Past Surgical History:  Procedure Laterality Date  . ABDOMINOPLASTY  12-02-2001  @[facility]    AND VENTRAL HERNIA REPAIR  . CESAREAN SECTION  x2  last one 07-05-2020 @ [facility]   AND BILATERAL TUBAL LIGATION W/ LAST C/S  . DILATATION & CURETTAGE/HYSTEROSCOPY WITH MYOSURE N/A 06/10/2020   Procedure: DILATATION & CURETTAGE/HYSTEROSCOPY WITH MYOSURE;  Surgeon: Lafonda Mosses, MD;  Location: [facility];  Service: Gynecology;  Laterality: N/A;  . DILATION AND CURETTAGE OF UTERUS  12/ 2021  @ gyn office w/ iv sedation  . EPIGASTRIC HERNIA REPAIR  12-31-2000 @[facility]   . HYSTEROSCOPY WITH D & C  07-10-2012  @[facility]    W/ TRUE CLEAR RESECTION POLYPECTOMY  . INGUINAL HERNIA REPAIR Left 2005 approx.   per pt double hernia w/ 6 inch mesh  . UMBILICAL HERNIA REPAIR  11-13-2000 @[facility]     Family History  Problem Relation Age of Onset  . Hypertension Mother   . Fibroids Mother   . Lung cancer Mother   . Sudden death Neg Hx   . Diabetes Neg Hx   . Heart attack Neg Hx   . Hyperlipidemia Neg Hx   . Breast cancer Neg Hx   . Ovarian cancer Neg Hx      Social History   Socioeconomic History  . Marital status: Married    Spouse name: Not on file  . Number of children: Not on file  . Years of education: Not on file  . Highest education level: Not on file  Occupational History  . Occupation: works at [employer]: [employer]  Tobacco Use  . Smoking status: Never Smoker  . Smokeless tobacco: Never Used  Vaping Use  . Vaping Use: Never used  Substance and Sexual Activity  . Alcohol use: Yes    Alcohol/week: 0.0 standard drinks    Comment: occasional  . Drug use: Never  . Sexual activity: Not on file  Other Topics Concern  . Not on file  Social History Narrative  . Not on file   Social Determinants of Health   Financial Resource Strain: Not on file  Food Insecurity: Not on file  Transportation Needs: Not on file  Physical Activity: Not on file  Stress: Not on file  Social Connections: Not on file    Current Medications:  Current Outpatient Medications:  .  acetaminophen (TYLENOL) 325 MG tablet, Take 2 tablets (650 mg total) by mouth every 6 (six) hours as needed for up to 20 doses., Disp: 20 tablet, Rfl: 0 .  calcium-vitamin D (OSCAL WITH D) 500-200 MG-UNIT tablet, Take 1 tablet by mouth 1 day or  1 dose., Disp: , Rfl:  .  desonide (DESOWEN) 0.05 % cream, , Disp: , Rfl: 1 .  HM GARLIC PO, Take by mouth daily., Disp: , Rfl:  .  indapamide (LOZOL) 1.25 MG tablet, Take 1 tablet (1.25 mg total) by mouth daily., Disp: 30 tablet, Rfl: 2 .  Magnesium 125 MG CAPS, Take 250 mg by mouth in the morning and at bedtime. , Disp: , Rfl:  .  Multiple Vitamin (MULTIVITAMIN) capsule, Take 1 capsule by mouth daily., Disp: , Rfl:  .  Omega 3 1200 MG CAPS, Take 1,200 mg by mouth., Disp: , Rfl:  .  ibuprofen (ADVIL) 800 MG tablet, Take 1 tablet (800 mg total) by mouth every 8 (eight) hours as needed for up to 30 doses. (Patient not taking: Reported on 06/29/2020), Disp: 30 tablet, Rfl: 0  Review of Systems: Denies appetite changes,  fevers, chills, fatigue, unexplained weight changes. Denies hearing loss, neck lumps or masses, mouth sores, ringing in ears or voice changes. Denies cough or wheezing.  Denies shortness of breath. Denies chest pain or palpitations. Denies leg swelling. Denies abdominal distention, pain, blood in stools, constipation, diarrhea, nausea, vomiting, or early satiety. Denies pain with intercourse, dysuria, frequency, hematuria or incontinence. Denies hot flashes, pelvic pain, vaginal bleeding or vaginal discharge.   Denies joint pain, back pain or muscle pain/cramps. Denies itching, rash, or wounds. Denies dizziness, headaches, numbness or seizures. Denies swollen lymph nodes or glands, denies easy bruising or bleeding. Denies anxiety, depression, confusion, or decreased concentration.  Physical Exam: BP (!) 167/94 (BP Location: Left Arm, Patient Position: Sitting)   Temp (!) 97 F (36.1 C) (Tympanic)   Resp 18   Wt 141 lb 4.8 oz (64.1 kg)   LMP 12/27/2017   SpO2 100%   BMI 25.84 kg/m  General: Alert, oriented, no acute distress. HEENT: Normocephalic, atraumatic, sclera anicteric. Chest: Unlabored breathing on room air. Extremities: Grossly normal range of motion.  Warm, well perfused.  No edema bilaterally. Skin: No rashes or lesions noted. GU: Normal appearing external genitalia without erythema, excoriation, or lesions.  Speculum exam reveals normal-appearing cervix, no bleeding or discharge.    Laboratory & Radiologic Studies: FINAL MICROSCOPIC DIAGNOSIS:   A. ENDOMETRIUM, POLYPECTOMY:  - Endometrioid-type polyp. No malignancy identified.   B. ENDOCERVICAL ADHESIONS, EXCISION:  - Endocervical-type polyp. No malignancy identified.   Assessment & Plan: Shannon Fields is a 60 y.o. woman with PMB with findings of endocervical and endometrial polyps s/p hysteroscopy and polypectomy 1/7.  Patient is doing very well after surgery and meeting all postoperative milestones.  I am  releasing her back to normal activity.  I am releasing her back to her GYN for routine care.  We discussed reasons that she should seek care in the future, including if she developed vaginal bleeding again or pelvic cramping.  If she were to need any sort of intrauterine procedure in the future, this would be best done in the setting of the operating room given her endocervical polyp/adhesions.  She was provided with a copy of her pathology report.  22 minutes of total time was spent for this patient encounter, including preparation, face-to-face counseling with the patient and coordination of care, and documentation of the encounter.  Jeral Pinch, MD  Division of Gynecologic Oncology  Department of Obstetrics and Gynecology  [facility]

## 2020-06-29 ENCOUNTER — Inpatient Hospital Stay: Payer: 59 | Attending: Gynecologic Oncology | Admitting: Gynecologic Oncology

## 2020-06-29 ENCOUNTER — Other Ambulatory Visit: Payer: Self-pay

## 2020-06-29 ENCOUNTER — Encounter: Payer: Self-pay | Admitting: Gynecologic Oncology

## 2020-06-29 VITALS — BP 167/94 | Temp 97.0°F | Resp 18 | Wt 141.3 lb

## 2020-06-29 DIAGNOSIS — Z9889 Other specified postprocedural states: Secondary | ICD-10-CM

## 2020-06-29 DIAGNOSIS — N840 Polyp of corpus uteri: Secondary | ICD-10-CM

## 2020-06-29 DIAGNOSIS — N950 Postmenopausal bleeding: Secondary | ICD-10-CM

## 2020-06-29 NOTE — Patient Instructions (Signed)
I will release you back to your GYN. Please call if you need anything moving forward!

## 2020-07-15 MED FILL — INDAPAMIDE 1.25 MG TABLET: 1.25 | 30 days supply | Qty: 30 | Fill #1

## 2020-07-27 ENCOUNTER — Other Ambulatory Visit: Payer: 59

## 2020-07-27 ENCOUNTER — Other Ambulatory Visit: Payer: Self-pay

## 2020-07-27 DIAGNOSIS — R030 Elevated blood-pressure reading, without diagnosis of hypertension: Secondary | ICD-10-CM | POA: Diagnosis not present

## 2020-07-28 LAB — BASIC METABOLIC PANEL
BUN/Creatinine Ratio: 14 (ref 9–23)
BUN: 10 mg/dL (ref 6–24)
CO2: 23 mmol/L (ref 20–29)
Calcium: 9.8 mg/dL (ref 8.7–10.2)
Chloride: 102 mmol/L (ref 96–106)
Creatinine, Ser: 0.69 mg/dL (ref 0.57–1.00)
Glucose: 115 mg/dL — ABNORMAL HIGH (ref 65–99)
Potassium: 3.9 mmol/L (ref 3.5–5.2)
Sodium: 142 mmol/L (ref 134–144)
eGFR: 100 mL/min/{1.73_m2} (ref >59)

## 2020-08-02 ENCOUNTER — Other Ambulatory Visit (HOSPITAL_COMMUNITY): Payer: Self-pay | Admitting: Pharmacist

## 2020-08-02 MED FILL — CARESTART COVID-19 HOME TES: 4 days supply | Qty: 4 | Fill #0

## 2020-08-05 DIAGNOSIS — H524 Presbyopia: Secondary | ICD-10-CM | POA: Diagnosis not present

## 2020-08-05 DIAGNOSIS — H52203 Unspecified astigmatism, bilateral: Secondary | ICD-10-CM | POA: Diagnosis not present

## 2020-08-16 MED FILL — INDAPAMIDE 1.25 MG TABLET: 1.25 | 30 days supply | Qty: 30 | Fill #2

## 2020-09-20 ENCOUNTER — Other Ambulatory Visit: Payer: Self-pay | Admitting: Family Medicine

## 2020-09-20 ENCOUNTER — Other Ambulatory Visit (HOSPITAL_COMMUNITY): Payer: Self-pay

## 2020-09-20 MED ORDER — INDAPAMIDE 1.25 MG PO TABS
1.2500 mg | ORAL_TABLET | Freq: Every day | ORAL | 3 refills | Status: DC
  Filled 2020-09-20: qty 90, 90d supply, fill #0

## 2020-10-06 ENCOUNTER — Encounter: Payer: Self-pay | Admitting: Family Medicine

## 2020-10-06 ENCOUNTER — Other Ambulatory Visit: Payer: Self-pay

## 2020-10-06 ENCOUNTER — Ambulatory Visit (INDEPENDENT_AMBULATORY_CARE_PROVIDER_SITE_OTHER): Payer: 59 | Admitting: Family Medicine

## 2020-10-06 DIAGNOSIS — I10 Essential (primary) hypertension: Secondary | ICD-10-CM

## 2020-10-06 DIAGNOSIS — R739 Hyperglycemia, unspecified: Secondary | ICD-10-CM | POA: Diagnosis not present

## 2020-10-06 NOTE — Progress Notes (Signed)
    SUBJECTIVE:   CHIEF COMPLAINT / HPI: .htn  Hypertension Taking indapamide without problems.  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  No murmurs, gallops or rubs.    Lungs:  Normal respiratory effort, chest expands symmetrically. Lungs are clear to auscultation, no crackles or wheezes. Extremities:  No cyanosis, edema, or deformity noted with good range of motion of all major joints.     ASSESSMENT/PLAN:   Hypertension BP Readings from Last 3 Encounters:  10/06/20 124/80  06/29/20 (!) 167/94  06/10/20 (!) 148/69   At goal today. Will check bmet    Annual Examination Female over 1 yo  PHQ9 score reviewed  Blood pressure reviewed  I considered the following items based upon USPSTF recommendations: Cervical Cancer if female at birth Mammogram Colon cancer screening Osteoporosis screening HIV testing:  Hepatitis C testing Cholesterol screening STI screening if high risk (Hepatitis B, Syphilis, Gonorrhea, Chlamydia) Immunizations - Influenza, Covid, [place], Pneumonia, Tetanus  See After Visit Summary for recommendations   Lind Covert, MD [place]

## 2020-10-06 NOTE — Patient Instructions (Addendum)
Good to see you today - Thank you for coming in  Things we discussed today:  Exercise and Time  Let me know about your thoughts about the sleep study  I will call you if your tests are not good.  Otherwise, I will send you a message on MyChart (if it is active) or a letter in the mail..  If you do not hear from me with in 2 weeks please call our office.

## 2020-10-07 ENCOUNTER — Telehealth: Payer: Self-pay | Admitting: Family Medicine

## 2020-10-07 LAB — BASIC METABOLIC PANEL
BUN/Creatinine Ratio: 14 (ref 9–23)
BUN: 12 mg/dL (ref 6–24)
CO2: 25 mmol/L (ref 20–29)
Calcium: 9.7 mg/dL (ref 8.7–10.2)
Chloride: 102 mmol/L (ref 96–106)
Creatinine, Ser: 0.85 mg/dL (ref 0.57–1.00)
Glucose: 132 mg/dL — ABNORMAL HIGH (ref 65–99)
Potassium: 4.4 mmol/L (ref 3.5–5.2)
Sodium: 141 mmol/L (ref 134–144)
eGFR: 79 mL/min/{1.73_m2} (ref >59)

## 2020-10-07 NOTE — Telephone Encounter (Signed)
Email sent on 5/12   Shannon Fields  Good to see you yesterday  Your kidney function and potassium look good  Your blood sugar is a little high.  Don't think we need to do anything with swim season starting but we should keep an eye on it and repeat in 6 months or so  Let me know if you have questions  Take care  Lee    Component Ref Range & Units 1 d ago 2 mo ago 3 mo ago 1 yr ago 2 yr ago 3 yr ago 4 yr ago  Glucose 65 - 99 mg/dL 132 High   115 High   112 High  R, CM  88  94  115 High   91   BUN 6 - 24 mg/dL 12  10  14  R  8  9  13  9  R   Creatinine, Ser 0.57 - 1.00 mg/dL 0.85  0.69  0.80 R  0.79  0.80  0.75  0.69 R, CM   eGFR >59 mL/min/1.73 79  100 CM        BUN/Creatinine Ratio 9 - 23 14  14   10  11  17     Sodium 134 - 144 mmol/L 141  142  141 R  140  141  142  138 R   Potassium 3.5 - 5.2 mmol/L 4.4  3.9  3.4 Low  R  4.3  4.2  4.3  4.0 R   Chloride 96 - 106 mmol/L 102  102  104 R  98  100  101  103 R   CO2 20 - 29 mmol/L 25  23   21  24  26  25  R   Calcium 8.7 - 10.2 mg/dL 9.7  9.8

## 2020-10-08 NOTE — Assessment & Plan Note (Signed)
BP Readings from Last 3 Encounters:  10/06/20 124/80  06/29/20 (!) 167/94  06/10/20 (!) 148/69   At goal today. Will check bmet

## 2020-10-21 ENCOUNTER — Ambulatory Visit (INDEPENDENT_AMBULATORY_CARE_PROVIDER_SITE_OTHER): Payer: 59 | Admitting: Family Medicine

## 2020-10-21 ENCOUNTER — Encounter: Payer: Self-pay | Admitting: Family Medicine

## 2020-10-21 DIAGNOSIS — Z713 Dietary counseling and surveillance: Secondary | ICD-10-CM | POA: Diagnosis not present

## 2020-10-21 DIAGNOSIS — E46 Unspecified protein-calorie malnutrition: Secondary | ICD-10-CM

## 2020-10-21 NOTE — Progress Notes (Signed)
Medical Nutrition Therapy Patient has completed 2nd dose of COVID-19 vaccine. Appt start time: 1330 end time: 1430 (1 hour) Primary concerns today: management of blood pressure and glucose as well as diverticulosis.  Relevant history/background: Shannon Fields was diagnosed with diverticulitis in early 2021, and has been making careful food choices since that time.  Has been trying to eat low-fat, low-Na, low-sugar, low-cholesterol, and to avoid foods w/ seeds or nuts.  Weight has been pretty stable since 2019, down nearly 20 lb since 2017.  Shannon Fields started a low-dose diuretic in January 2022 for BP.  Recently concerned with elevated BG and BP.    Assessment:  Shannon Fields is especially concerned re. her recent elevated BG.    Usual eating pattern: 2-3 meals (lunch is often more of a snack) and 1-2 snacks per day. Frequent foods and beverages: water, 2 c coffee, soy creamer, vitamin water zero; 3/4 c Cheerios & 1/4 Fiber One, 1/4 c oat milk, (2 eggs on Sat & Sun), Activia probiotic yogurt shot, raw veg's, hummus, banana.   Avoided foods: nuts & seeds (d/t diverticuli), cow's milk, high-Na foods, fast foods, fried foods.   Usual physical activity: None with regularity.  Just recovering from a few weeks of vertigo.   Sleep: Estimates she gets 5-7 hrs/night.   24-hr recall: (Up at 7 AM) B (7:45 AM)-   3/4 c Cheerios & 1/4 Fiber One, 1/4 c oat milk, 1 c coffee, 2 tbsp vanilla soy creamer Snk ( AM)-   1+ oz Activia probiotic yogurt shot, water (80 kcal)  L (2:30 PM)-  3/4 c raw veg's, 1/4 c hummus, water Snk ( PM)-  --- D (6:30 PM)-  3 small tacos - chorizo, grilled shrimp, duck, 2-3 chips, 2 tbsp guacamole, 1 bite plantain, margarita, water Snk (8 PM)-  12 oz beer Typical day? Yes.  Except seldom eats out, as did for dinner, and usually has only 7 alcoholic drinks/week (usually white wine).    Nutritional Diagnosis:  NI-5.7.1 Inadequate protein intake As related to disinterest in meat as well as efforts to  minimize poor-quality fats.  As evidenced by usual intake of significant protein source only once/day.  Handouts given during visit include:  After-Visit Summary (AVS)  Meal Planning Form  Demonstrated degree of understanding via:  Teach Back  Barriers to learning/adherence to lifestyle change: Time constraints.  Monitoring/Evaluation:  Dietary intake, exercise, and body weight in 4 week(s).

## 2020-10-21 NOTE — Patient Instructions (Addendum)
Suggestion: Try adding cinnamon to your coffee grounds before brewing.    Nutrition concerns:  Protein is important, especially as we get older.  Get a meaningful source of protein at each meal, aiming for at least 20 g per meal. Protein sources:  Yogurt (mix flavored with plain), tofu, hummus or other pureed beans, i.e., in burritos, which you can freeze (beware of the Na content of commercial hummus.  You can make your own, which freezes well), creamy nut butters PB2 peanut butter powder, eggs (consider for lunch).    Blood sugar control:  Our insulin tends to become less sensitive as the day wears on.  It makes sense to have carb in the morning, following an overnight fast, but reduce carb content of meals toward dinner time.    Meal planning: Complete at least page 2 of Meal Planning Form provided today, and email for review and suggestions sometime in the next couple of weeks.    Cereal options:  Shredded Wheat with vanilla soy milk (SHAKE WELL).  Aim for a full cup of soy milk.  (All Bran Original has a good fiber to sugar ratio.  Bran Flakes also has a lot of fiber without excessive sugar.) Consider the vanilla soy milk in lieu of creamer, and use liberally to help meet protein needs.    Vegetables: Continue getting veg's twice daily, but increase mid-day veg's (and protein foods).  Consider taking left-over cooked (e.g., roasted) veg's sometimes.  Keep in mind that there is very good evidence that potassium (fruit & veg's!) helps reduce BP.  In addn, the soluble fiber from F&V help moderate BG.  Lastly, usually high F&V intake is associated with better weight management, which itself helps reduce BG and BP.    F/U in-office appt on June 23 at 1:30 PM.

## 2020-11-18 ENCOUNTER — Other Ambulatory Visit: Payer: Self-pay

## 2020-11-18 ENCOUNTER — Ambulatory Visit (INDEPENDENT_AMBULATORY_CARE_PROVIDER_SITE_OTHER): Payer: 59 | Admitting: Family Medicine

## 2020-11-18 DIAGNOSIS — Z713 Dietary counseling and surveillance: Secondary | ICD-10-CM | POA: Diagnosis not present

## 2020-11-18 NOTE — Progress Notes (Signed)
Medical Nutrition Therapy Patient has completed 2nd dose of COVID-19 vaccine. Appt start time: 1330 end time: 1430 (1 hour) Primary concerns today:  management of blood pressure and glucose as well as diverticulosis .  Relevant history/background: Shannon Fields was diagnosed with diverticulitis in early 2021, and has been making careful food choices since that time.  Has been trying to eat low-fat, low-Na, low-sugar, low-cholesterol, and to avoid foods w/ seeds or nuts.  Weight has been pretty stable since 2019, down nearly 20 lb since 2017.  Shannon Fields started a low-dose diuretic in January 2022 for BP.  Recently concerned with elevated BG and BP.    Assessment:  Shannon Fields has been using Shredded Wheat and other high-fiber cereals with soy milk for breakfast.  Has not been eating sweets, and has been limiting wine to ~5 glasses/week.  She was away at a conference last week, so did not adhere to dietary efforts she's been working on.   Recent eating pattern: 2-3 meals and 1-2 snacks per day.  Has done better with meals, but not consistent.  Recent usual physical activity: Swimming up to an hour 2-3 days/week.  Walking ~20 min x 1-2 wk.   Sleep: Estimates she gets 5-6 hrs/night.  24-hr recall:  (Up at 7:30 AM) B (9:30 AM)-  1 1/2 c Shr Wht, Sp K hi-pro, FiberOne, 3/4 c soy milk, 1 c coffee, 2 tbsp vanilla soy creamer Snk ( AM)-  water L (1 PM)-  1/4 c coleslaw, 1 c Grk orzo salad, water Snk ( PM)-  water D (8 PM)-  1 large pc chx pot pie (takeout), water Snk (8:30)-  1/2 c blueberries, 1 glass wine Snk (10 PM)-  ~6 small crackers Typical day? Yes.    Nutritional Diagnosis: Little or no progress on NI-5.7.1 Inadequate protein intake as related to disinterest in meat as well as efforts to minimize poor-quality fats as evidenced by usual intake of significant protein source only once/day.  Handouts given during visit include: After-Visit Summary (AVS)  Demonstrated degree of understanding via:  Teach Back   Barriers to learning/adherence to lifestyle change: Time constraints.  Shannon Fields work site from home to office is also challenging for food planning.    Monitoring/Evaluation:  Dietary intake, exercise, and body weight in 6 week(s).

## 2020-11-18 NOTE — Patient Instructions (Addendum)
Glycemic response to foods:  Keep in mind that you can blunt the rise in blood glucose by accompanying the carb food with ingredients with fat or fiber (and sometimes protein).    Most fruits are low- or moderate-glycemic.  You can still eat high-glycemic fruit (oranges, bananas, grapes, and watermelon), but be cautious with portion size, e.g., 1/2 banana.    Goals: At least 30:00 swimming 2-3 X wk; at least 20:00 other exercise 2 X wk; & 10:00 swivel board 2 X wk. Eat breakfast, lunch, & dinner, conforming to recommended meals (breakfast >300 kcal; lunch to include veg's; & dinner twice volume of veg's vs. pro & carb).  TRACKING:   Record # min of exercise; record B, L, and D for each meal that meets goal.     Write down your weekly totals/averages [# of meals/7 days] at the far right of each week.     Make sure you keep your Goals Sheet in a place where you see it.    F/U in-office appt on Thursday, August 4 at 1:30 PM.

## 2020-12-30 ENCOUNTER — Ambulatory Visit: Payer: 59 | Admitting: Family Medicine

## 2021-01-03 ENCOUNTER — Other Ambulatory Visit: Payer: Self-pay

## 2021-01-03 ENCOUNTER — Ambulatory Visit (INDEPENDENT_AMBULATORY_CARE_PROVIDER_SITE_OTHER): Payer: 59 | Admitting: Family Medicine

## 2021-01-03 DIAGNOSIS — Z713 Dietary counseling and surveillance: Secondary | ICD-10-CM

## 2021-01-03 NOTE — Progress Notes (Signed)
Medical Nutrition Therapy Patient has completed 2nd dose of COVID-19 vaccine. Appt start time: 1100 end time: 1200 (1 hour) Primary concerns today:  management of blood pressure and glucose as well as diverticulosis .  Relevant history/background: Shannon Fields was diagnosed with diverticulitis in early 2021, and has been making careful food choices since that time.  Has been trying to eat low-fat, low-Na, low-sugar, low-cholesterol, and to avoid foods w/ seeds or nuts.  Weight has been pretty stable since 2019, down nearly 20 lb since 2017.  Shannon Fields started a low-dose diuretic in January 2022 for BP.  Recently concerned with elevated BG and BP.    Assessment:  Shannon Fields had a diverticulitis flare the week of July 25, which followed a week of travel.  She has been fine for the past week, after more careful (high-fiber) foods, consistent yogurt consumption, and plenty of fluids.  Had been consistently documenting progress on Goals Sheet prior to travel a couple weeks ago.  BP has remained well controlled since discontinuing diuretic.  Recent eating pattern: 3 meals and 1-2 snacks per day.   Recent usual physical activity: Swimming up to an hour 3+ days/week.  Walking ~20 min x 1-2 wk.   Sleep: Estimates she gets 6 hrs/night (8 on weekends).  24-hr recall:  (Up at 9 AM) B ( AM)-  1 c coffee, soy vanilla creamer Snk ( AM)-  water L (11 PM)-  2 fried eggs, water Snk ( PM)-   D (8:30 PM)-  1 large salad, 4 oz chx, avocado, 2 tbsp drsng, 1 large baked potato, spray butter, water, 1 vodka tonic Snk ( PM)-  3 Vanilla Wafers, 1/4 c blueberries Typical day? No. More typical day would include breakfast and a real lunch.    Nutritional Diagnosis: Little or no progress on NI-5.7.1 Inadequate protein intake as related to disinterest in meat as well as efforts to minimize poor-quality fats as evidenced by usual intake of significant protein source only once/day.  Handouts given during visit include: After-Visit Summary  (AVS)  Barriers to learning/adherence to lifestyle change: Time constraints remain a challenge.    Monitoring/Evaluation:  Dietary intake, exercise, and body weight prn.

## 2021-01-03 NOTE — Patient Instructions (Addendum)
I still recommend getting 60 grams of protein per day, or about 20 grams at each meal.  It would be god for you to rely on beans several times a week.    7 grams of protein is provided by: 1 oz meat, fish, poultry 1 oz cheese  1 large egg 2 tbsp nut butter  c most beans / tofu  Dairy: 1 c yogurt varies from 8 g to ~24 g.  Check the label.   1 c milk = 8 g  Use of PB2:  Use the Performance version for its higher protein content.   Or consider using regular peanut butter from which you have poured off most of the oil.    Goals: At least 30:00 swimming 2-3 X wk; at least 20:00 other exercise 2 X wk; & 10:00 swivel board 2 X wk.       This fall, schedule with a Physiological scientist at [facility].   Eat breakfast, lunch, & dinner, conforming to recommended meals (breakfast >300 kcal; lunch to include veg's; & dinner twice volume of veg's vs. pro & carb).       Include both protein and carb at breakfast.   TRACKING:    Record # min of exercise; record B, L, and D for each meal that meets goal.         Write down your weekly totals/averages [# of meals/7 days] at the far right of each week.         Make sure you keep your Goals Sheet in a place where you see it.    Keep in mind that you can add to take-out or pre-prepared foods, such as chicken or tuna salad.  You can also bring home an entree and add a veg serving to that.    From today's conversation:  [facility].    F/U in-office appt on Thursday, August 4 at 1:30 PM.

## 2021-01-04 ENCOUNTER — Encounter: Payer: Self-pay | Admitting: *Deleted

## 2021-02-17 ENCOUNTER — Other Ambulatory Visit: Payer: Self-pay | Admitting: Family Medicine

## 2021-02-17 DIAGNOSIS — Z1231 Encounter for screening mammogram for malignant neoplasm of breast: Secondary | ICD-10-CM

## 2021-02-22 ENCOUNTER — Ambulatory Visit
Admission: RE | Admit: 2021-02-22 | Discharge: 2021-02-22 | Disposition: A | Discharge status: Home / Self Care | Payer: 59 | Source: Ambulatory Visit | Attending: Sports Medicine | Admitting: Sports Medicine

## 2021-02-22 ENCOUNTER — Ambulatory Visit (INDEPENDENT_AMBULATORY_CARE_PROVIDER_SITE_OTHER): Payer: 59 | Admitting: Sports Medicine

## 2021-02-22 VITALS — Ht 62.0 in | Wt 140.0 lb

## 2021-02-22 DIAGNOSIS — M25562 Pain in left knee: Secondary | ICD-10-CM | POA: Diagnosis not present

## 2021-02-22 IMAGING — DX DG KNEE AP/LAT W/ SUNRISE*L*
3 series · 3 of 3 positions shown · non-contrast
Comparison: None.

CLINICAL DATA: Left knee pain.

EXAM:
LEFT KNEE 3 VIEWS

[dg knee ap/lat w/ sunrise left (1 of 3)]
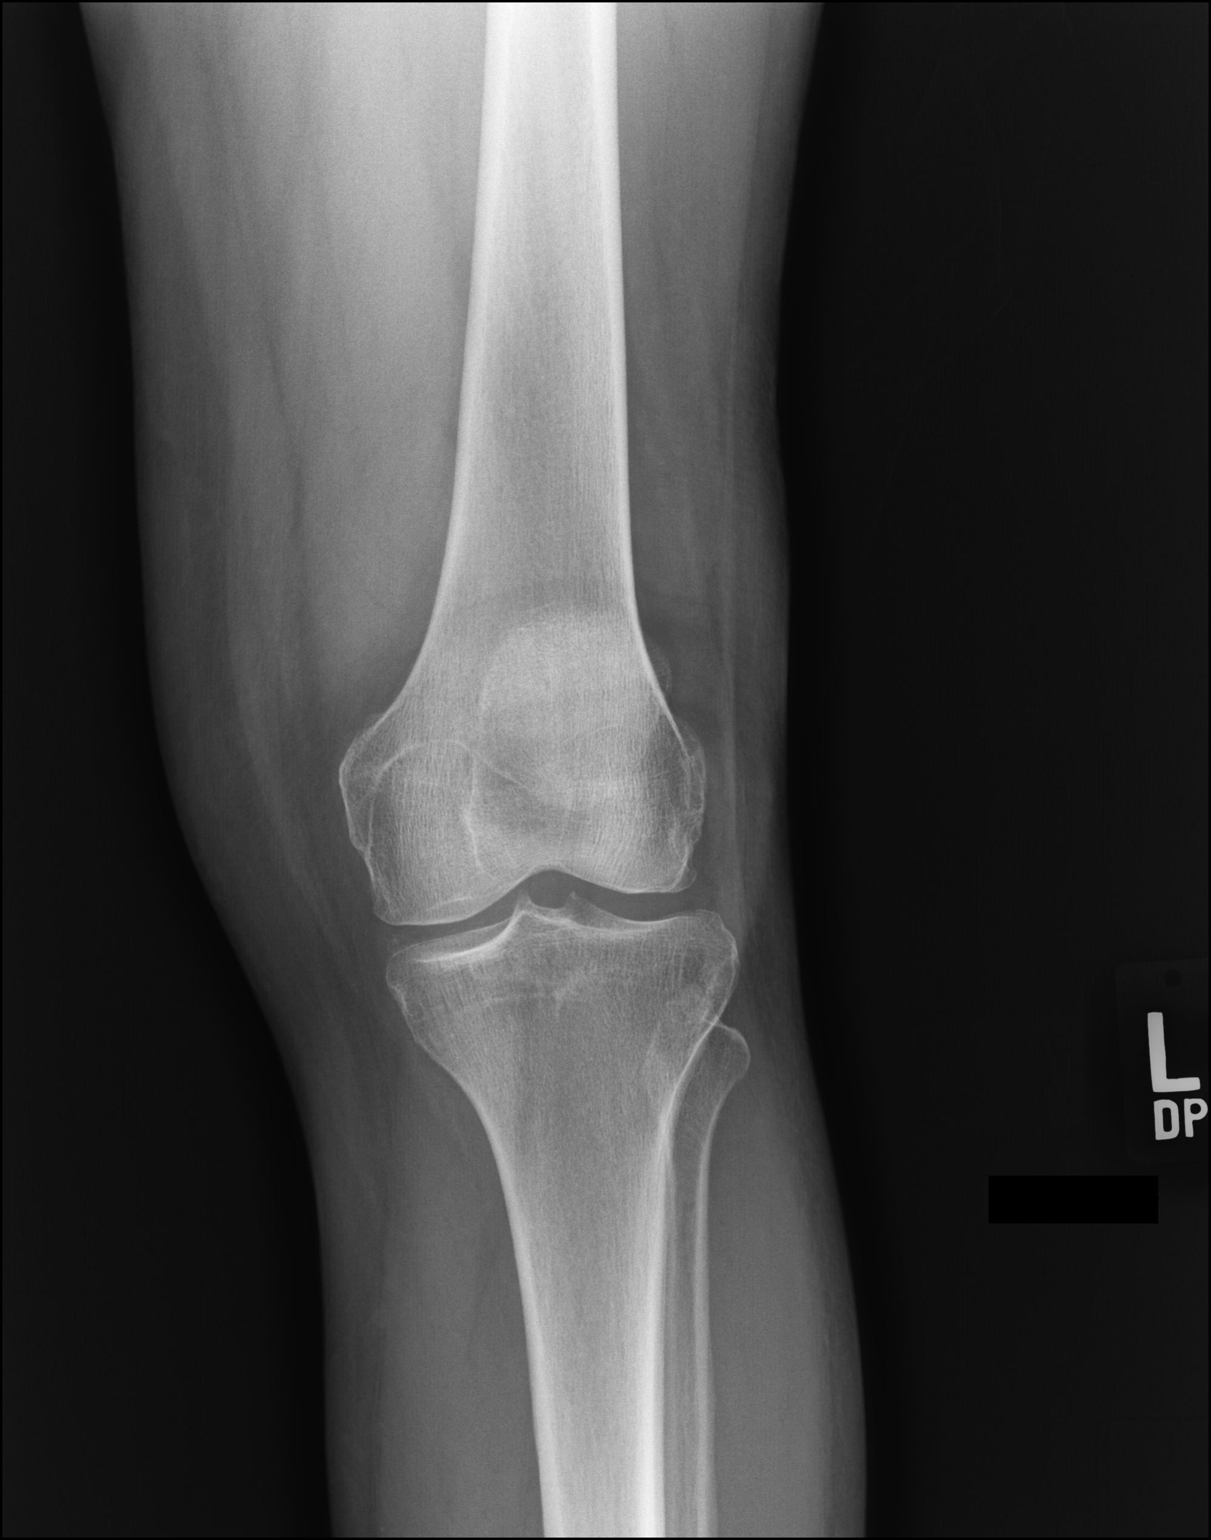

[dg knee ap/lat w/ sunrise left (2 of 3)]
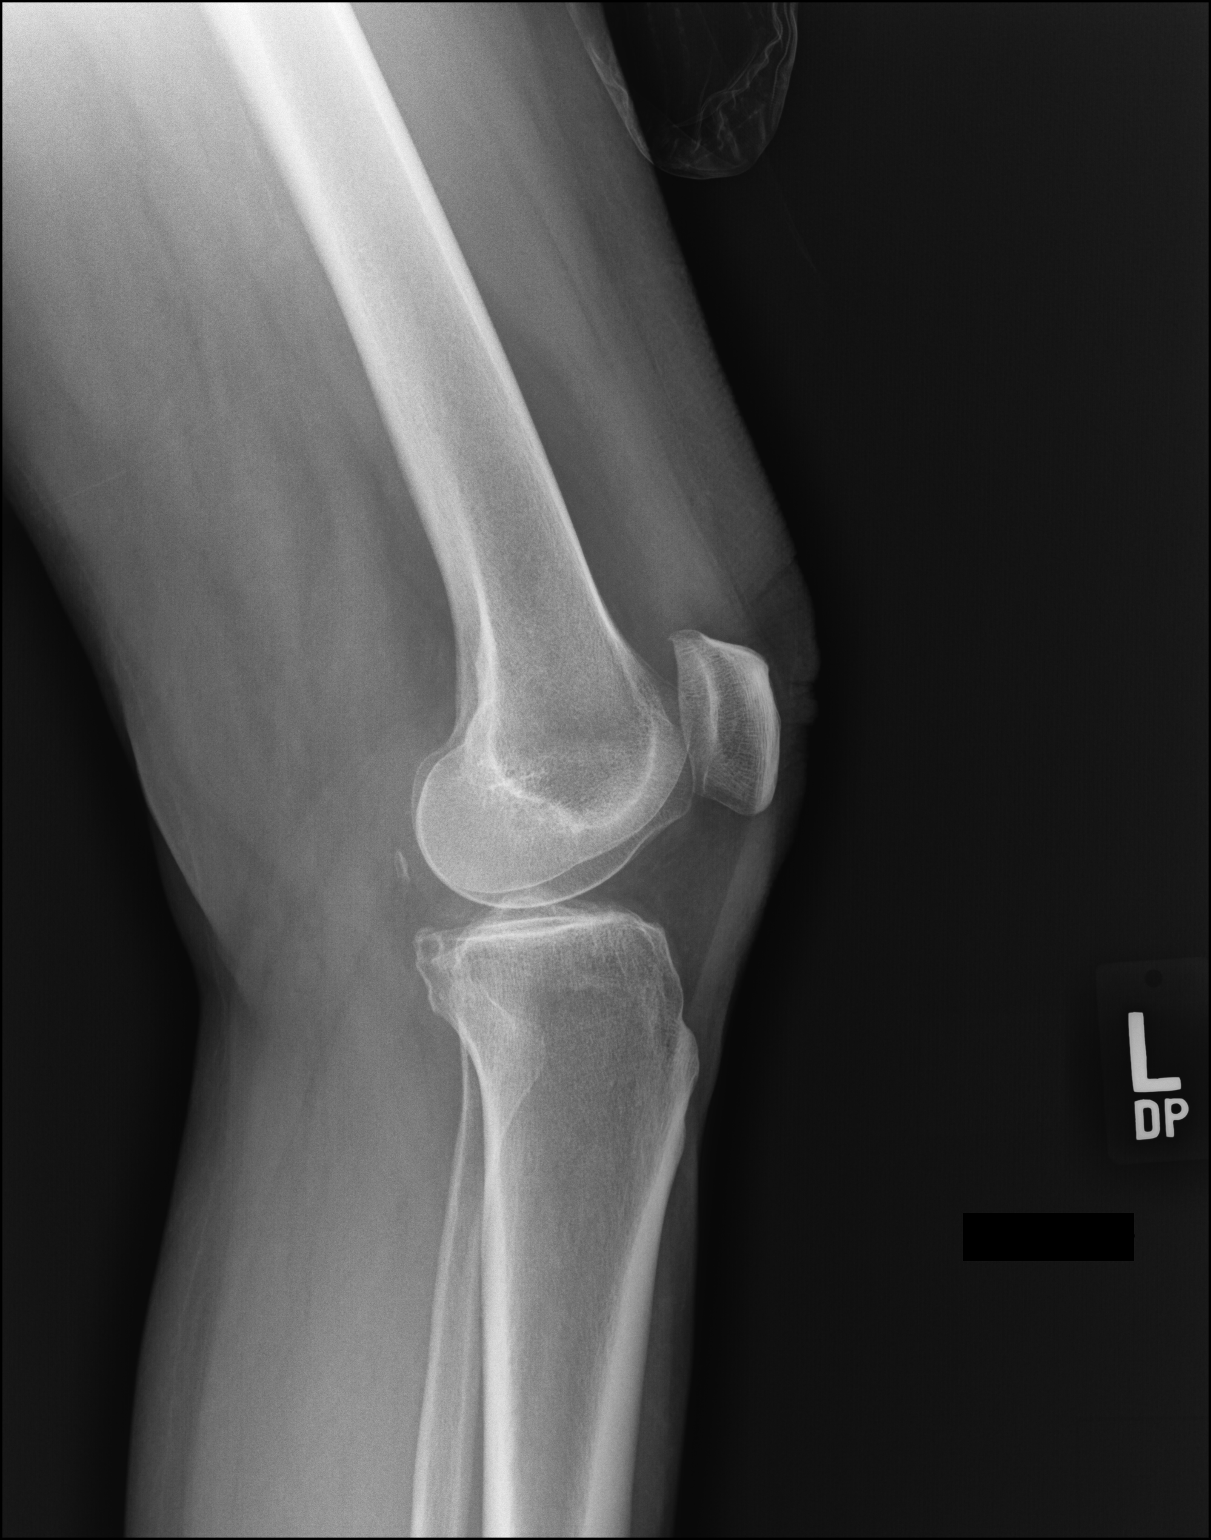

[dg knee ap/lat w/ sunrise left (3 of 3)]
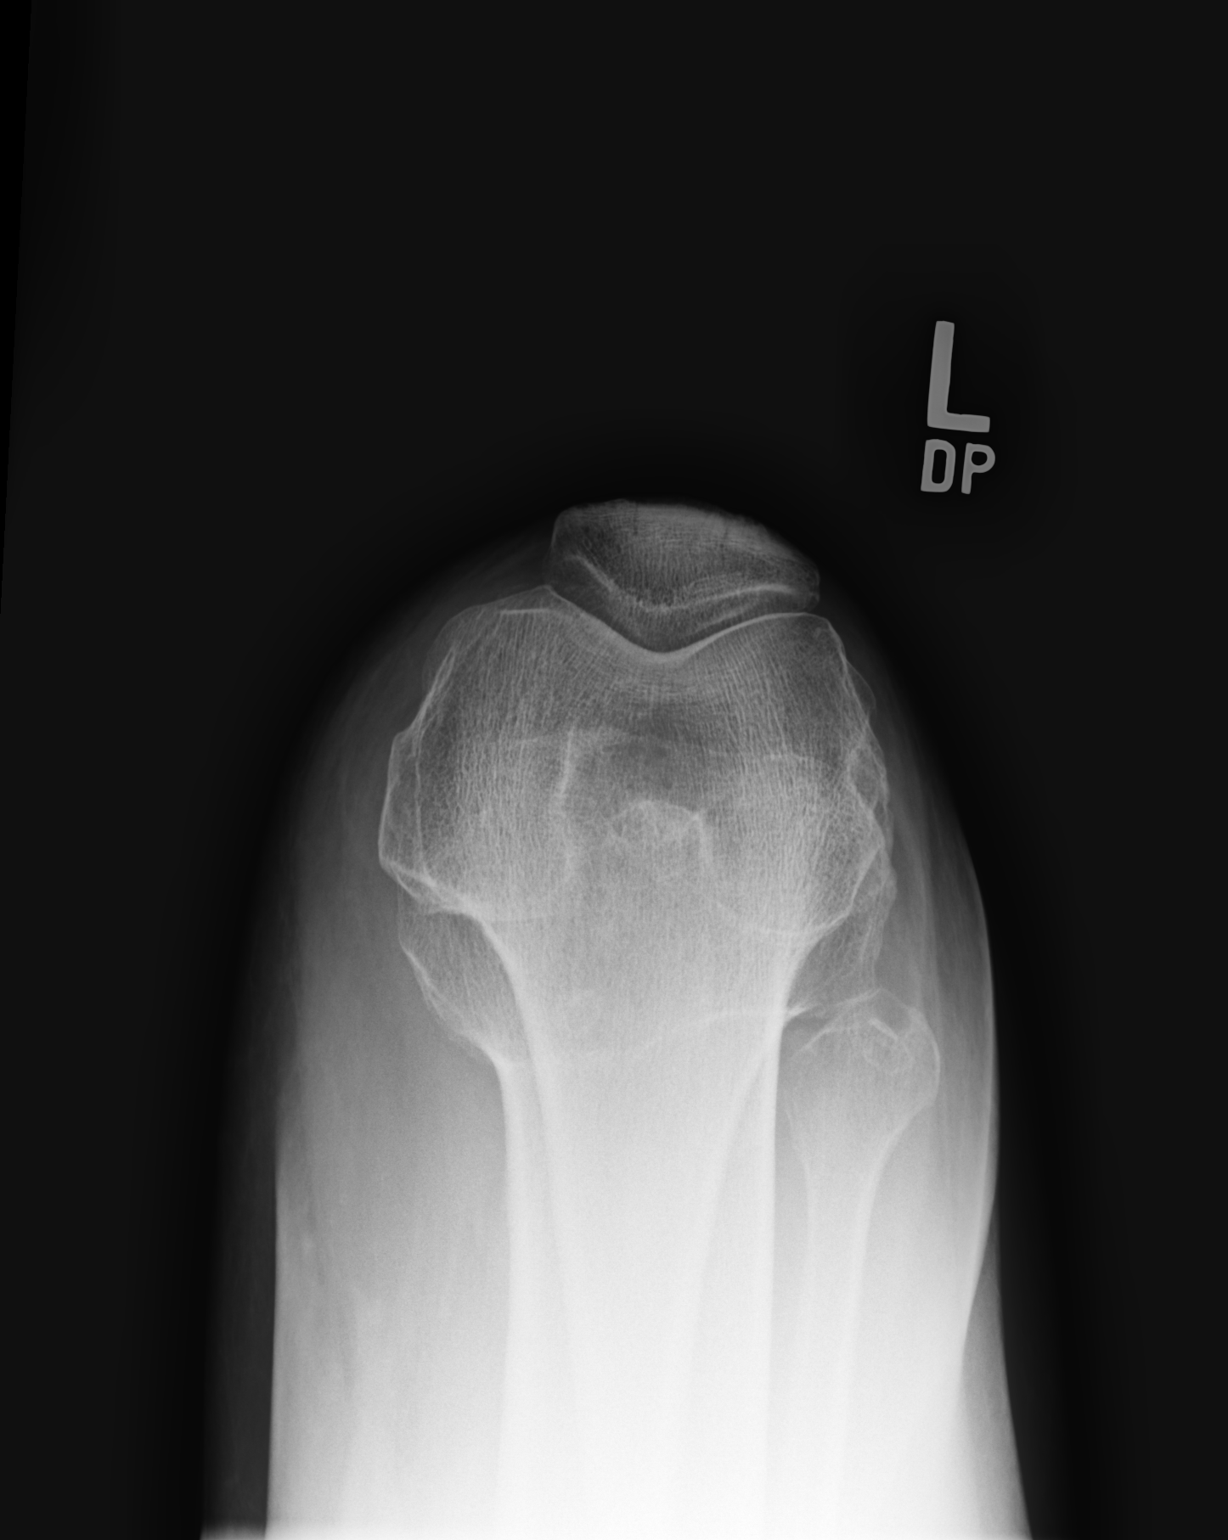

[3 of 3 positions shown; findings below may reference images not displayed]

FINDINGS: There is no acute fracture or dislocation. Mild arthritic changes.
No joint effusion. The soft tissues are unremarkable.
IMPRESSION: No acute findings.

## 2021-02-23 ENCOUNTER — Other Ambulatory Visit: Payer: Self-pay

## 2021-02-23 DIAGNOSIS — M25562 Pain in left knee: Secondary | ICD-10-CM

## 2021-02-23 NOTE — Progress Notes (Signed)
   Subjective:    Patient ID: Shannon Fields, female    DOB: [birth year], 60 y.o.   MRN: [id number]  HPI chief complaint: Left knee pain  Shannon Fields presents today complaining of 2 to 3 weeks of left knee pain.  No trauma that she can remember.  She enjoys swimming and began to notice some instability in the left knee shortly after a swim.  Her discomfort was in the anterior knee near the tibial tuberosity.  She did not notice any swelling.  She does exercise with a compression sleeve on.  She had similar issues a few years ago which was treated conservatively with home exercises.  She has resumed some home exercises and feels like her symptoms are improving.  She denies any true locking of the knee but, again, describes a "slipping "sensation that occurs with her discomfort.  Interim medical history reviewed Medications reviewed Allergies reviewed    Review of Systems As above    Objective:   Physical Exam  Well-developed, well-nourished.  No acute distress  Left knee: Full range of motion.  No obvious effusion on exam.  No tenderness to palpation along medial or lateral joint lines no tenderness to palpation along the patellar tendon or over the tibial tubercle.  Knee is stable to valgus and varus stressing.  Negative anterior drawer, negative posterior drawer.  Negative McMurray's.  Negative Thessaly's.  Neurovascularly intact distally.  Bedside ultrasound of the patellar tendon shows some mild hypoechoic changes at the distal portion of the tendon but the remainder of the tendon is unremarkable.  There is a small joint effusion seen in the suprapatellar pouch.  X-rays of the left knee including AP, lateral, and sunrise views show some irregularity in the posterior aspect of the knee on the lateral view.  This may be focal degenerative change but may also represent a loose body.  The remainder of her knee is unremarkable and without significant degenerative change.      Assessment & Plan:    Left knee pain and instability-rule out loose body  MRI of the left knee specifically to rule out an intra-articular loose body.  She could also have a degenerated and torn meniscus spite her benign exam today.  Phone follow-up with those results when available.  We will delineate further treatment based on those findings.  In the meantime she is reeducated on a home exercise program consisting of isometric quad exercises and hip abductor strengthening.  She is also planning on shutting down her pool for the season.  She has a Engineer, manufacturing and an elliptical and I think she is okay to exercise on those as long as her symptoms allow.  This note was dictated using Dragon naturally speaking software and may contain errors in syntax, spelling, or content which have not been identified prior to signing this note.

## 2021-02-25 ENCOUNTER — Other Ambulatory Visit (HOSPITAL_COMMUNITY): Payer: Self-pay

## 2021-02-25 MED ORDER — CARESTART COVID-19 HOME TEST VI KIT
PACK | 0 refills | Status: DC
  Filled 2021-02-25: qty 4, 4d supply, fill #0

## 2021-03-03 ENCOUNTER — Ambulatory Visit (HOSPITAL_COMMUNITY)
Admission: RE | Admit: 2021-03-03 | Discharge: 2021-03-03 | Disposition: A | Discharge status: Home / Self Care | Payer: 59 | Source: Ambulatory Visit | Attending: Sports Medicine | Admitting: Sports Medicine

## 2021-03-03 ENCOUNTER — Other Ambulatory Visit: Payer: Self-pay

## 2021-03-03 DIAGNOSIS — M25562 Pain in left knee: Secondary | ICD-10-CM | POA: Diagnosis not present

## 2021-03-03 IMAGING — MR MR KNEE*L* W/O CM
7 series · 40 of 40 positions shown · non-contrast
Comparison: None.

CLINICAL DATA: Anterior left knee pain around the patella for 3
weeks.

EXAM:
MRI OF THE LEFT KNEE WITHOUT CONTRAST
TECHNIQUE: Multiplanar, multisequence MR imaging of the knee was performed. No
intravenous contrast was administered.

[Series 15: T2 fat-sat · axial · left · 4.0mm · 0.36mm/px · z∈[-109,+35]mm · 6 of 30 slices shown (1 of 3)]
[im 1/30]
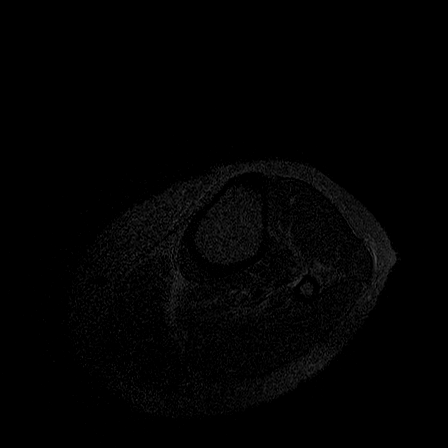
[im 6/30]
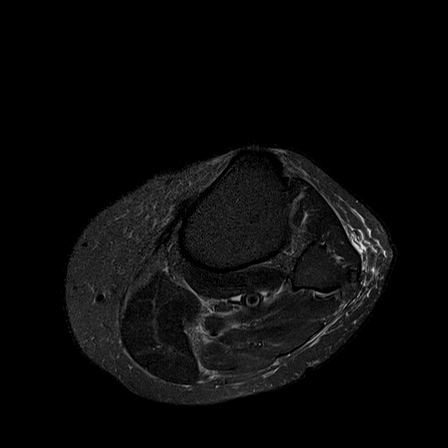
[im 12/30]
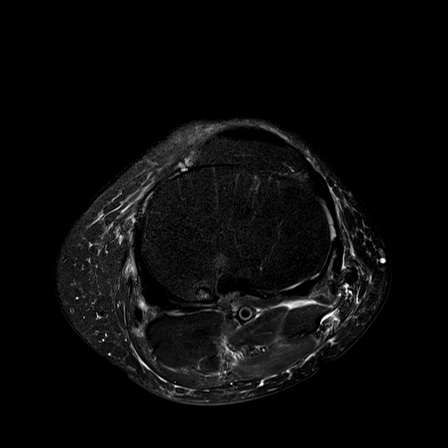
[im 18/30]
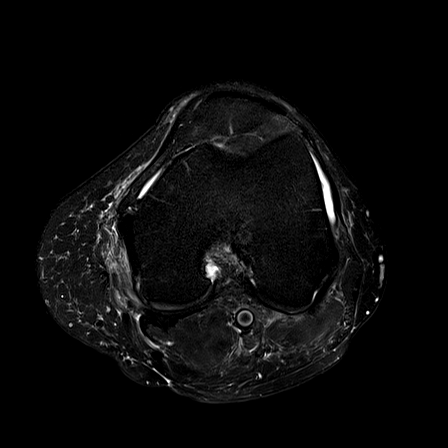
[im 24/30]
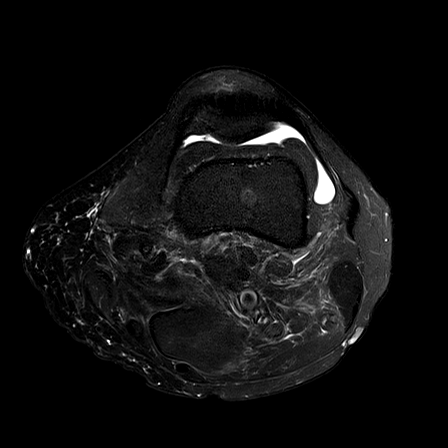
[im 30/30]
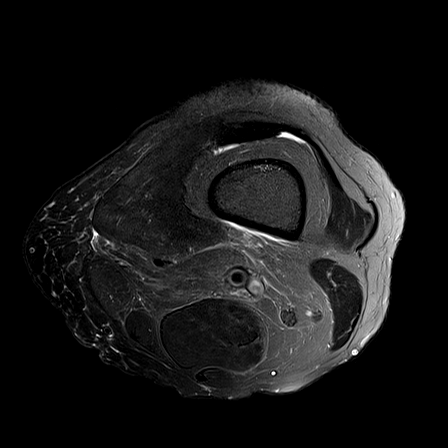

[Series 16: T1 · coronal · left · 4.0mm · 0.53mm/px · 6 of 26 slices shown]
[im 1/26]
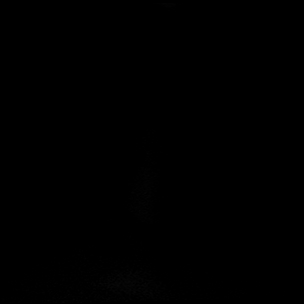
[im 6/26]
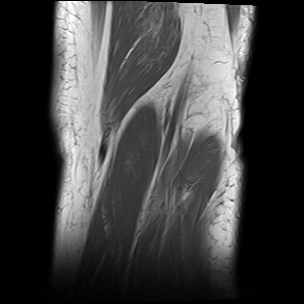
[im 11/26]
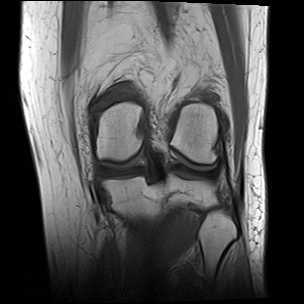
[im 16/26]
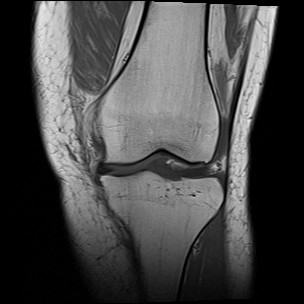
[im 21/26]
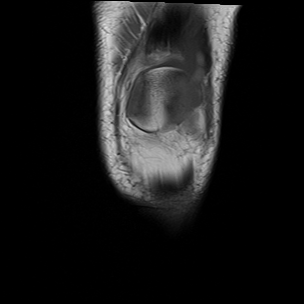
[im 26/26]
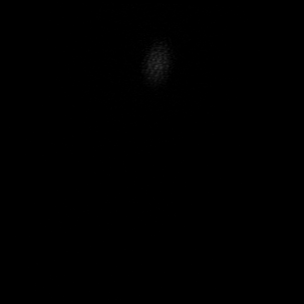

[Series 17: T2 fat-sat · coronal · left · 4.0mm · 0.62mm/px · 6 of 26 slices shown (2 of 3)]
[im 1/26]
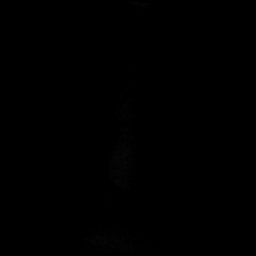
[im 6/26]
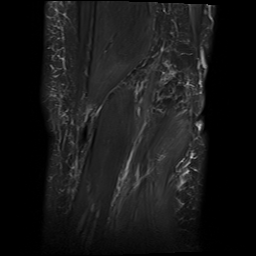
[im 11/26]
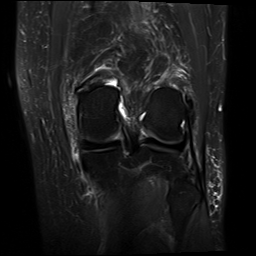
[im 16/26]
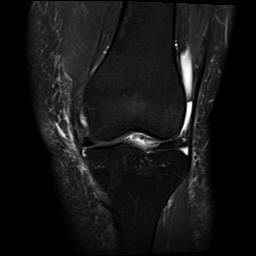
[im 21/26]
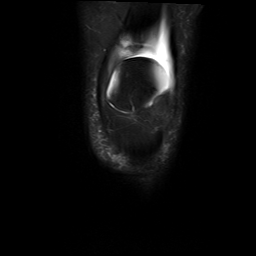
[im 26/26]
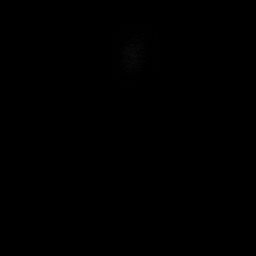

[Series 18: PD fat-sat · sagittal · left · 3.0mm · 0.56mm/px · 7 of 35 slices shown (1 of 2)]
[im 1/35]
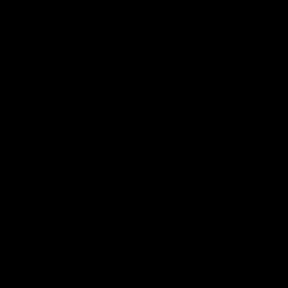
[im 6/35]
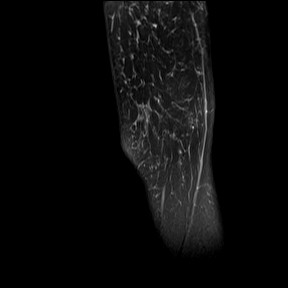
[im 12/35]
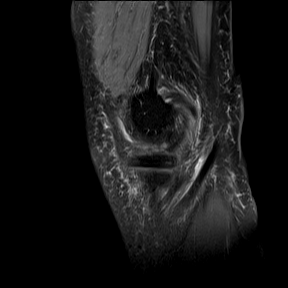
[im 18/35]
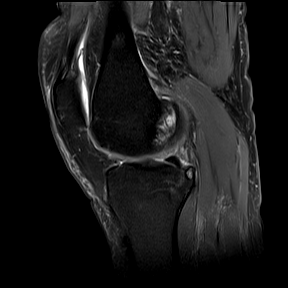
[im 23/35]
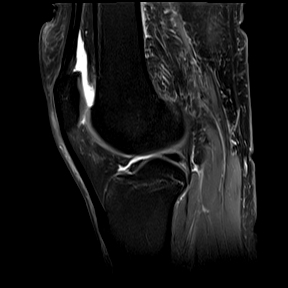
[im 29/35]
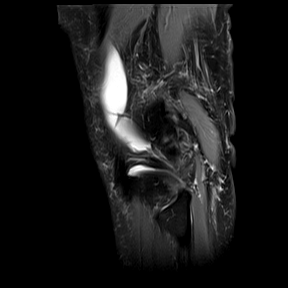
[im 35/35]
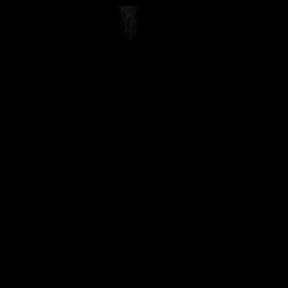

[Series 19: T2 fat-sat · sagittal · left · 3.0mm · 0.56mm/px · 7 of 34 slices shown (3 of 3)]
[im 1/34]
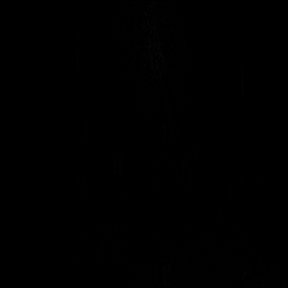
[im 6/34]
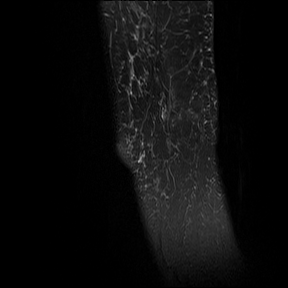
[im 12/34]
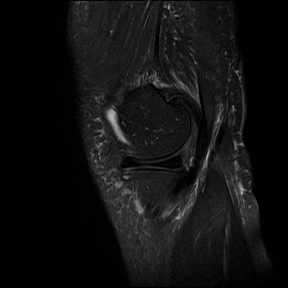
[im 17/34]
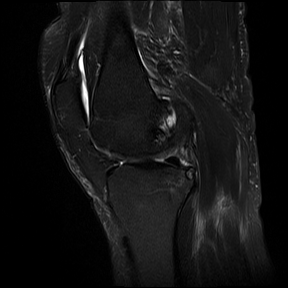
[im 23/34]
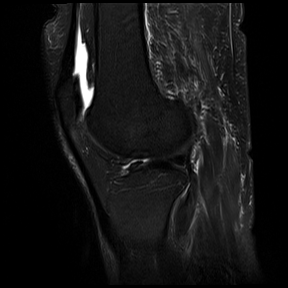
[im 28/34]
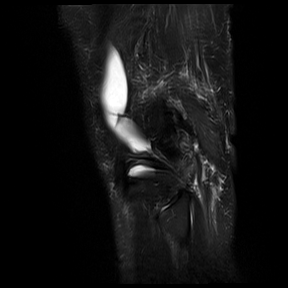
[im 34/34]
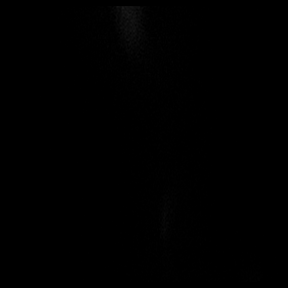

[Series 20: PD fat-sat · coronal · left · 4.0mm · 0.50mm/px · 6 of 26 slices shown (2 of 2)]
[im 1/26]
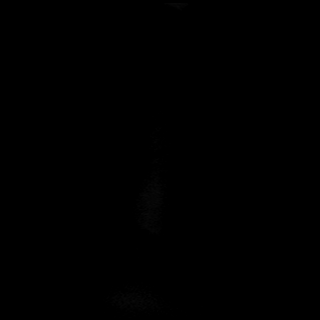
[im 6/26]
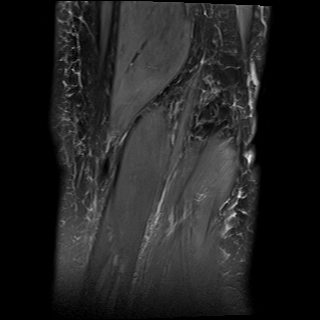
[im 11/26]
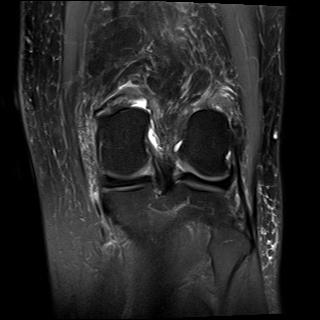
[im 16/26]
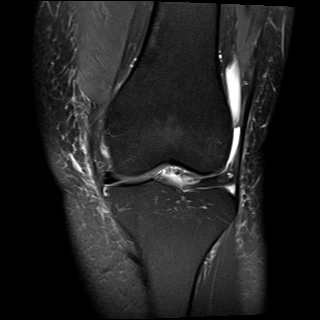
[im 21/26]
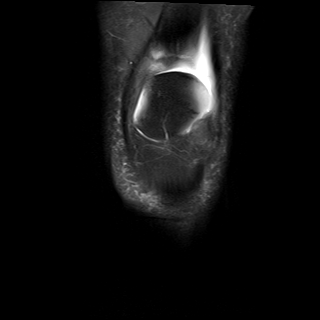
[im 26/26]
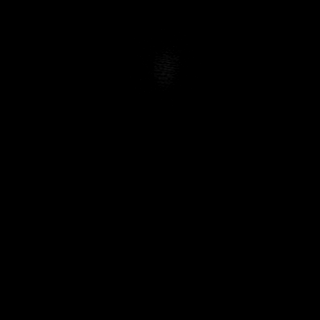

[Series 21: PD · coronal · left · 2.0mm · 0.47mm/px · 2 of 10 slices shown]
[im 1/10]
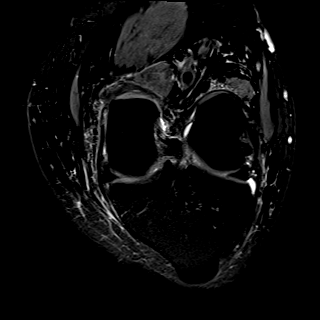
[im 10/10]
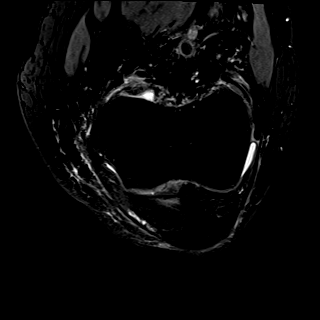

[40 of 40 positions shown; findings below may reference images not displayed]

FINDINGS: MENISCI

Medial: Intact.

Lateral: Intact.

LIGAMENTS

Cruciates: ACL and PCL are intact.

Collaterals: Medial collateral ligament is intact. Lateral
collateral ligament complex is intact.

CARTILAGE

Patellofemoral: Mild partial-thickness cartilage loss of the medial
patellar facet and trochlear groove.

Medial:  No chondral defect.

Lateral:  No chondral defect.

JOINT: Small joint effusion. Normal LYUSYA. No plical
thickening.

POPLITEAL FOSSA: Popliteus tendon is intact. No Baker's cyst.

EXTENSOR MECHANISM: Intact quadriceps tendon. Intact patellar
tendon. Intact lateral patellar retinaculum. Intact medial patellar
retinaculum. Intact MPFL.

BONES: No aggressive osseous lesion. No fracture or dislocation.

Other: No fluid collection or hematoma. Muscles are normal.
IMPRESSION: 1. No meniscal or ligamentous injury of the left knee.
2. Mild partial-thickness cartilage loss of the medial patellar
facet and trochlear groove.
3. Small joint effusion.

## 2021-03-08 ENCOUNTER — Other Ambulatory Visit: Payer: Self-pay

## 2021-03-08 ENCOUNTER — Ambulatory Visit
Admission: RE | Admit: 2021-03-08 | Discharge: 2021-03-08 | Disposition: A | Discharge status: Home / Self Care | Payer: 59 | Source: Ambulatory Visit | Attending: Family Medicine | Admitting: Family Medicine

## 2021-03-08 DIAGNOSIS — Z1231 Encounter for screening mammogram for malignant neoplasm of breast: Secondary | ICD-10-CM

## 2021-03-08 IMAGING — MG MM DIGITAL SCREENING BILAT W/ TOMO AND CAD
8 series · 8 of 24 positions shown · non-contrast
Comparison: Previous exam(s).

CLINICAL DATA: Screening.

EXAM:
DIGITAL SCREENING BILATERAL MAMMOGRAM WITH TOMOSYNTHESIS AND CAD
TECHNIQUE: Bilateral screening digital craniocaudal and mediolateral oblique
mammograms were obtained. Bilateral screening digital breast
tomosynthesis was performed. The images were evaluated with
computer-aided detection.

[L MLO synth-2D]
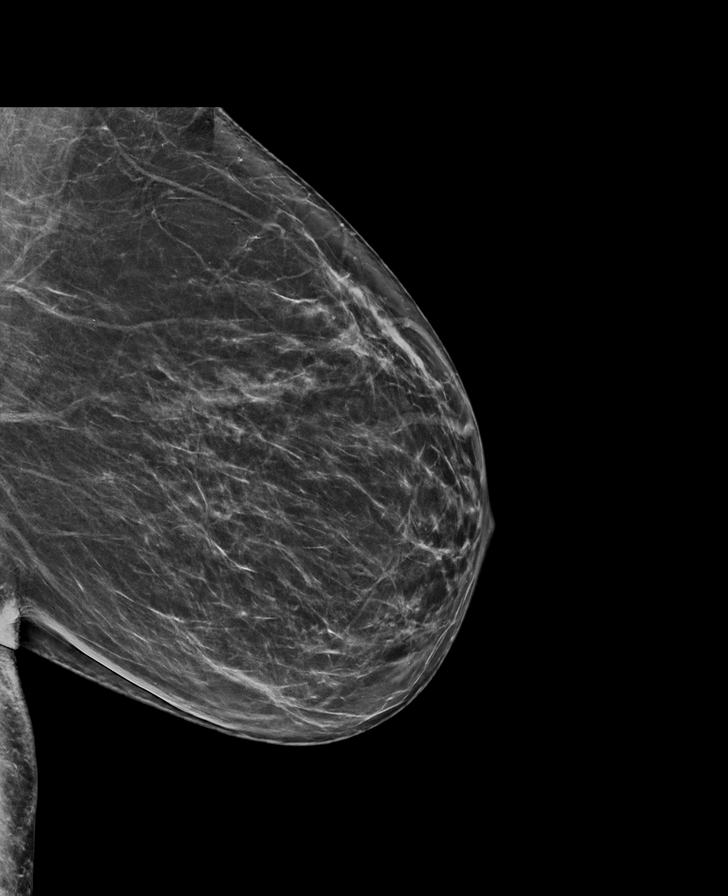

[R CC synth-2D]
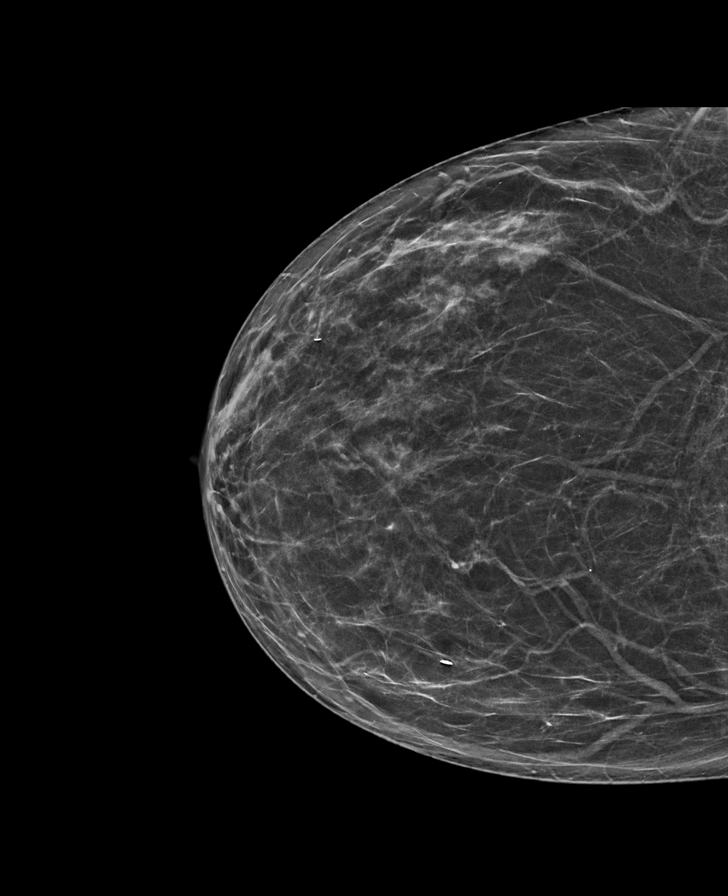

[L CC synth-2D]
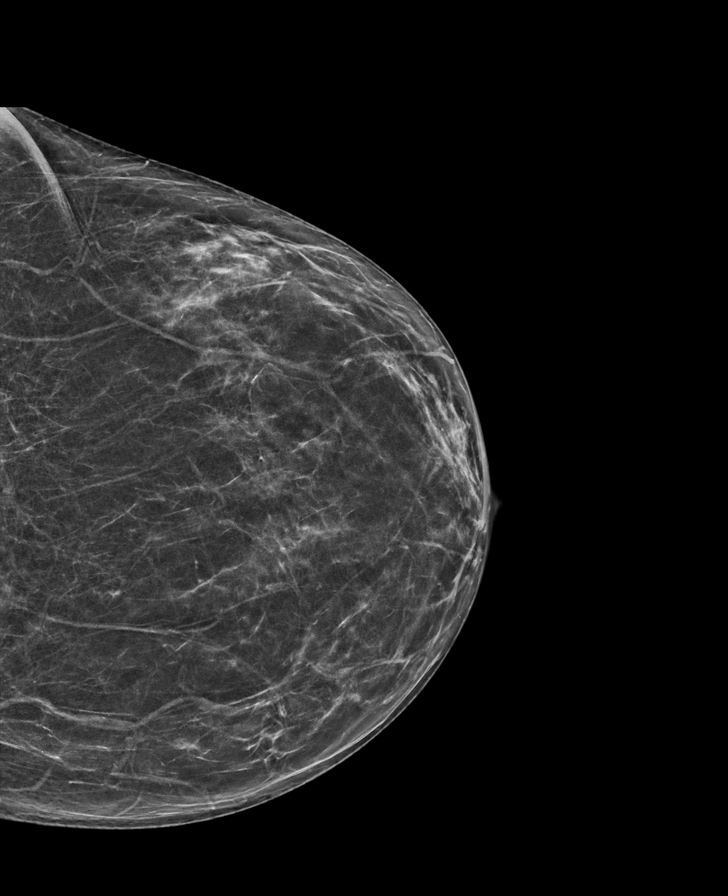

[R MLO synth-2D]
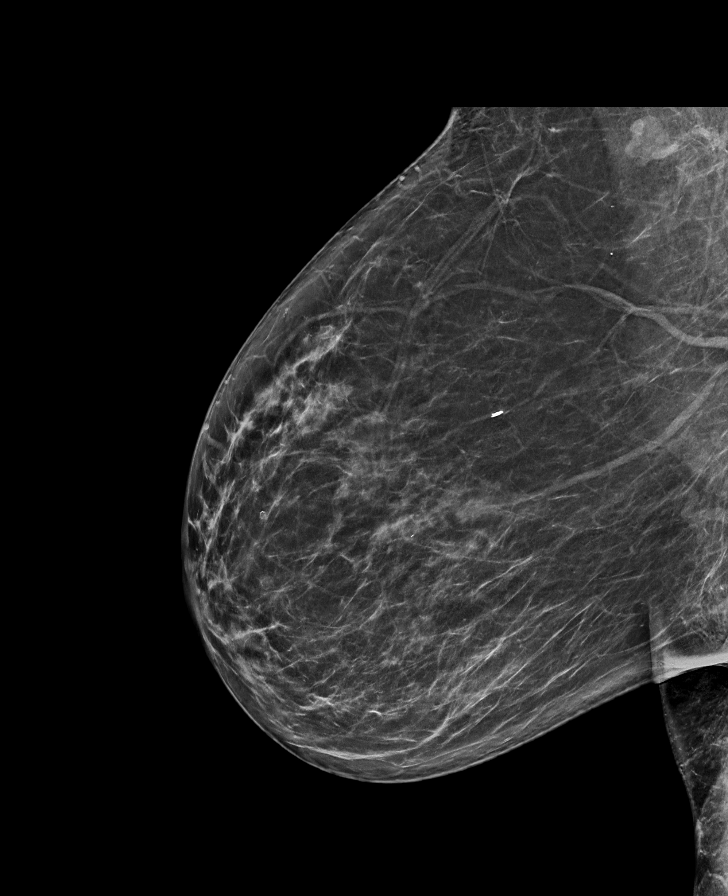

[R CC tomo · tomo slice 35/68.0]
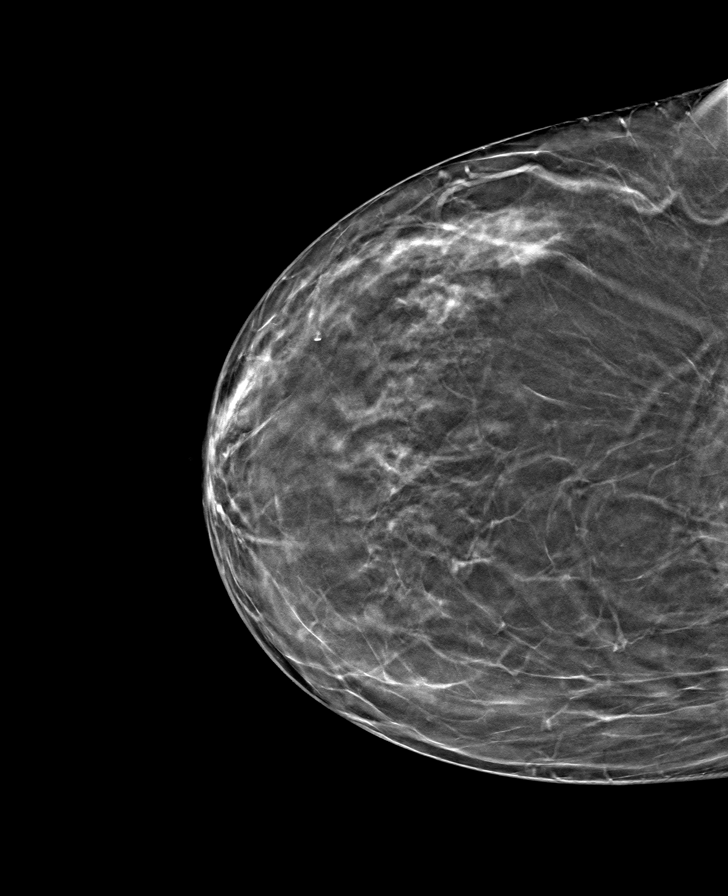

[L MLO tomo · tomo slice 39/78.0]
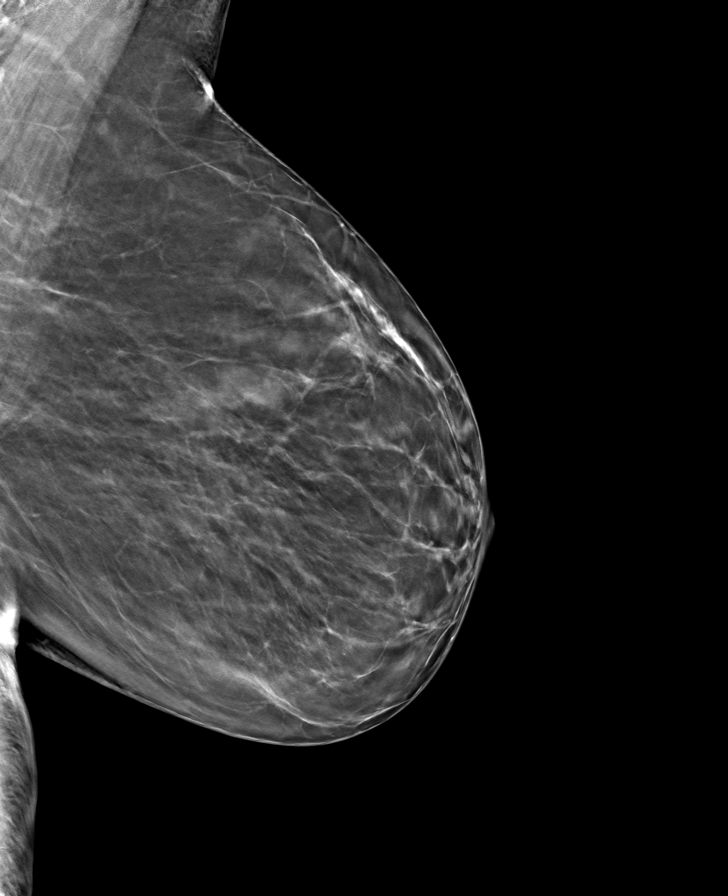

[L CC tomo · tomo slice 33/64.0]
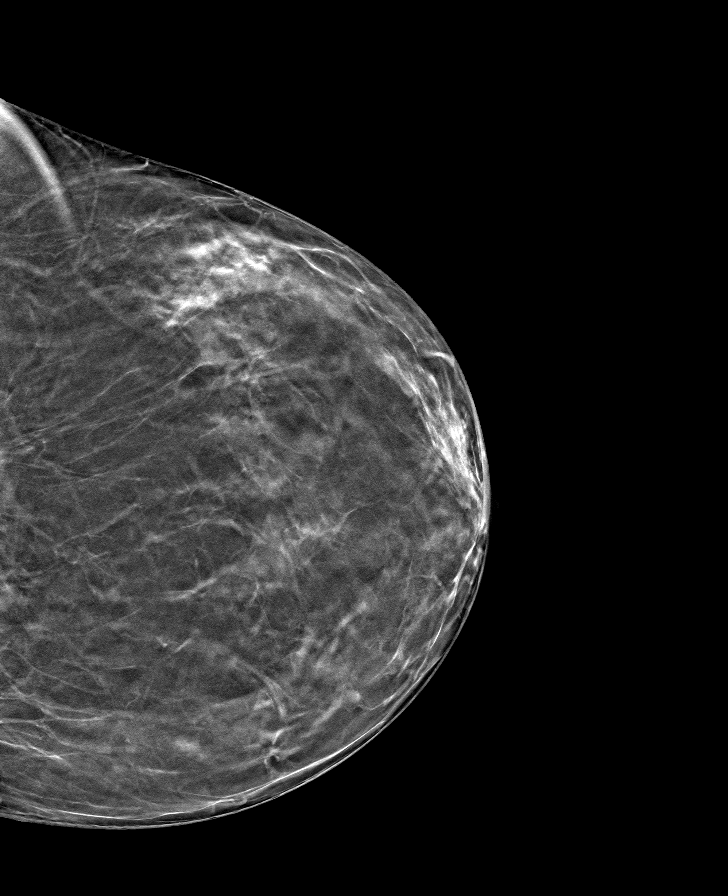

[R MLO tomo · tomo slice 40/79.0]
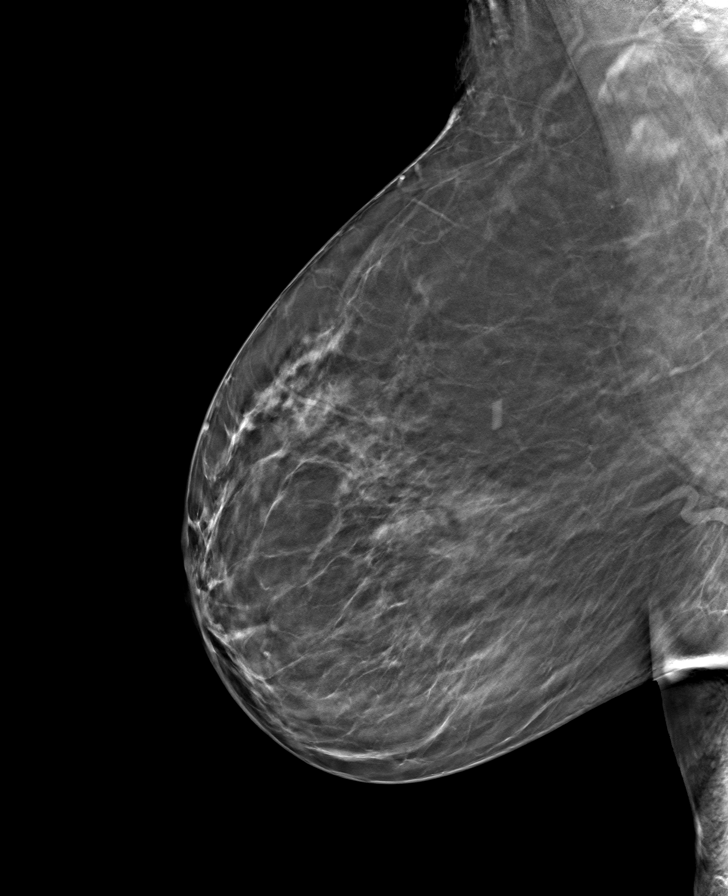

[8 of 24 positions shown; findings below may reference images not displayed]

ACR Breast Density Category b: There are scattered areas of
fibroglandular density.
FINDINGS: There are no findings suspicious for malignancy.
IMPRESSION: No mammographic evidence of malignancy. A result letter of this
screening mammogram will be mailed directly to the patient.

RECOMMENDATION:
Screening mammogram in one year. (Code:[BY])

BI-RADS CATEGORY  1: Negative.

## 2021-03-09 ENCOUNTER — Telehealth: Payer: Self-pay | Admitting: Sports Medicine

## 2021-03-09 NOTE — Telephone Encounter (Signed)
  I spoke with Shannon Fields on the phone today after reviewing MRI findings of her left knee.  No evidence of loose body or meniscal tear.  She does have some mild partial-thickness cartilage loss of the medial patellar facet and trochlear groove.  She also has a small joint effusion.  [name] seems to be doing better with activity modification and her home exercises.  She finds relief with compression and occasional over-the-counter NSAIDs.  I did discuss the possibility of a cortisone injection if symptoms worsen but she would like to wait on that for now.  I also recommended that she try to avoid repetitive squats or lunges when working out.  Otherwise, I think she may start to increase her activity as tolerated and she will follow-up with me for ongoing or recalcitrant issues.

## 2021-04-11 DIAGNOSIS — H43812 Vitreous degeneration, left eye: Secondary | ICD-10-CM | POA: Diagnosis not present

## 2021-04-14 ENCOUNTER — Ambulatory Visit (INDEPENDENT_AMBULATORY_CARE_PROVIDER_SITE_OTHER): Payer: 59 | Admitting: Sports Medicine

## 2021-04-14 VITALS — BP 150/100 | Ht 62.0 in | Wt 142.0 lb

## 2021-04-14 DIAGNOSIS — M25561 Pain in right knee: Secondary | ICD-10-CM | POA: Diagnosis not present

## 2021-04-14 DIAGNOSIS — M25861 Other specified joint disorders, right knee: Secondary | ICD-10-CM | POA: Diagnosis not present

## 2021-04-14 NOTE — Progress Notes (Signed)
   Subjective:    Patient ID: Shannon Fields, female    DOB: [birth year], 60 y.o.   MRN: [id number]  HPI chief complaint: Right knee pain  Shannon Fields presents today complaining of right knee pain and swelling.  She has noticed a small bump on the anterior aspect of her knee.  He does have some associated discomfort in this knee which has been present for several weeks.  She has been able to stay physically active.  No instability.  No recent trauma.    Review of Systems As above    Objective:   Physical Exam  Well-developed, well-nourished.  No acute distress  Right knee: Full range of motion.  No effusion.  There is a small palpable and fluctuant lump just to the lateral aspect of the mid patellar tendon.  It is not tender to palpation.  There is some tenderness to palpation along the lateral joint line.  No tenderness along the medial joint line.  Negative McMurray's.  Knee is grossly stable ligamentous exam.  Neurovascularly intact distally.  Limited MSK ultrasound of the right knee shows that the small palpable lump is cystic in nature.  It appears to originate from Hoffa's fat pad and does not appear to involve the patellar tendon.  Visualized lateral meniscus appears unremarkable.      Assessment & Plan:   Left knee pain with ultrasound evidence of a small ganglion type cyst  The cyst is not large enough or symptomatic enough to aspirate.  I do think it is reasonable to get an MRI of her knee to evaluate further.  Phone follow-up with those results when available.  In the meantime, I recommended a simple compression sleeve with exercise and I think she may continue to exercise using pain as her guide.  Of note, I did review her new workout program just to ensure that it does not involve a lot of squatting or lunging.  The exercises that I reviewed are safe for her to do.  This note was dictated using Dragon naturally speaking software and may contain errors in syntax, spelling, or content  which have not been identified prior to signing this note.

## 2021-04-26 DIAGNOSIS — Z6825 Body mass index (BMI) 25.0-25.9, adult: Secondary | ICD-10-CM | POA: Diagnosis not present

## 2021-04-26 DIAGNOSIS — Z01419 Encounter for gynecological examination (general) (routine) without abnormal findings: Secondary | ICD-10-CM | POA: Diagnosis not present

## 2021-05-12 DIAGNOSIS — H43812 Vitreous degeneration, left eye: Secondary | ICD-10-CM | POA: Diagnosis not present

## 2021-05-13 ENCOUNTER — Ambulatory Visit
Admission: RE | Admit: 2021-05-13 | Discharge: 2021-05-13 | Disposition: A | Discharge status: Home / Self Care | Payer: 59 | Source: Ambulatory Visit | Attending: Sports Medicine | Admitting: Sports Medicine

## 2021-05-13 ENCOUNTER — Other Ambulatory Visit: Payer: Self-pay

## 2021-05-13 DIAGNOSIS — M25561 Pain in right knee: Secondary | ICD-10-CM | POA: Diagnosis not present

## 2021-05-13 DIAGNOSIS — R2241 Localized swelling, mass and lump, right lower limb: Secondary | ICD-10-CM | POA: Diagnosis not present

## 2021-05-13 IMAGING — MR MR KNEE*R* W/O CM
6 series · 40 of 40 positions shown · non-contrast
Comparison: None.

CLINICAL DATA: Right anterior knee pain. Nodule along the anterior
aspect for 6 months. No known injury.

EXAM:
MRI OF THE RIGHT KNEE WITHOUT CONTRAST
TECHNIQUE: Multiplanar, multisequence MR imaging of the knee was performed. No
intravenous contrast was administered.

[Series 3: T2 fat-sat · axial · right · 4.0mm · 0.47mm/px · z∈[-30,+90]mm · 7 of 25 slices shown (1 of 3)]
[im 1/25]
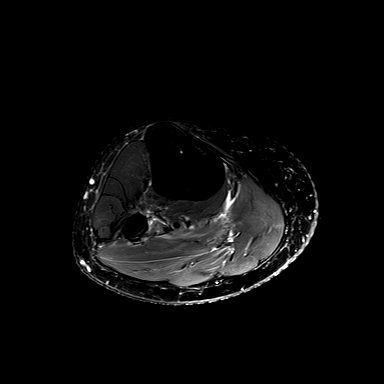
[im 5/25]
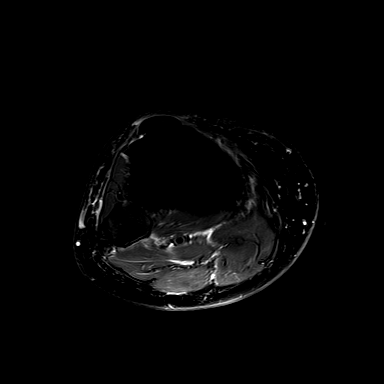
[im 9/25]
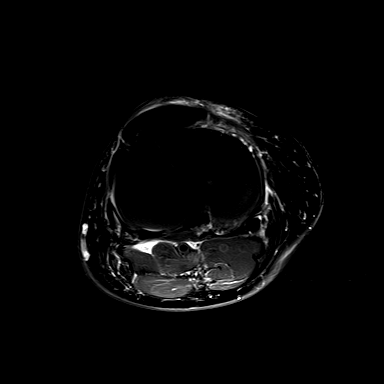
[im 13/25]
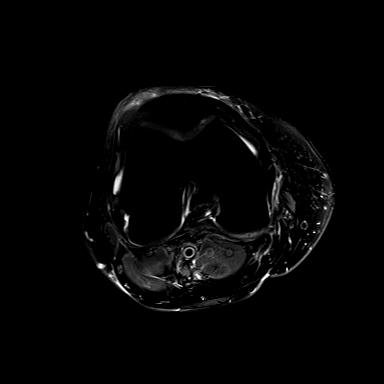
[im 17/25]
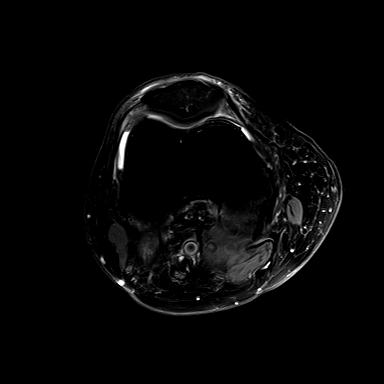
[im 21/25]
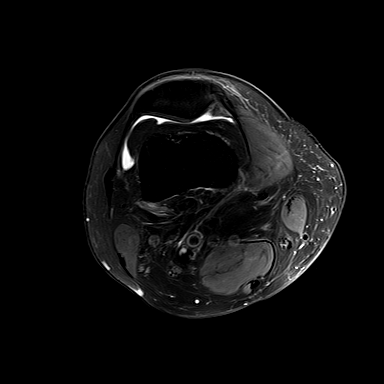
[im 25/25]
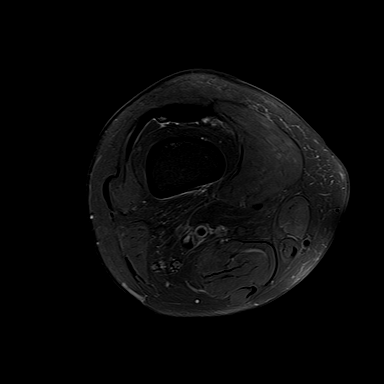

[Series 4: T1 · coronal · right · 4.0mm · 0.56mm/px · 6 of 23 slices shown]
[im 1/23]
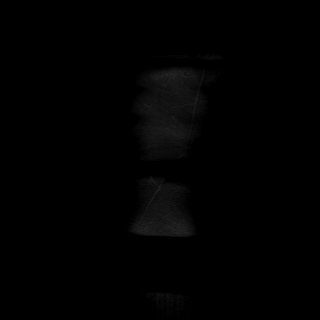
[im 5/23]
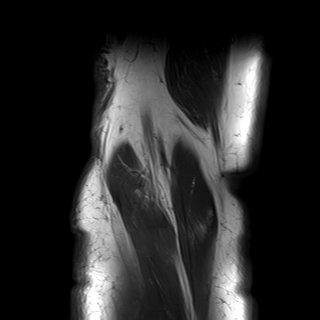
[im 9/23]
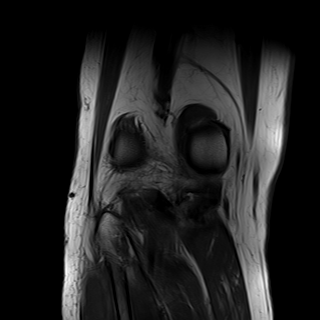
[im 14/23]
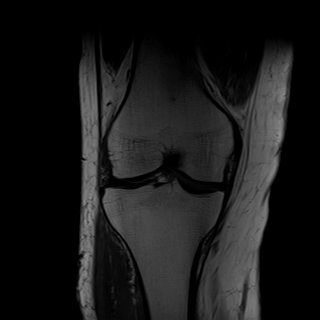
[im 18/23]
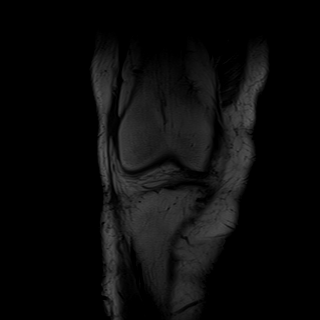
[im 23/23]
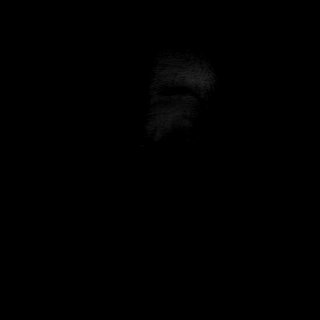

[Series 5: T2 fat-sat · coronal · right · 4.0mm · 0.56mm/px · 6 of 23 slices shown (2 of 3)]
[im 1/23]
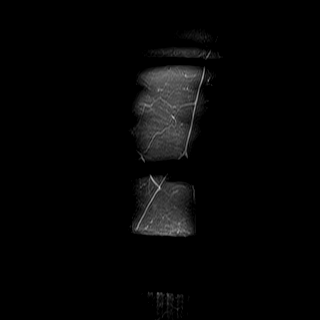
[im 5/23]
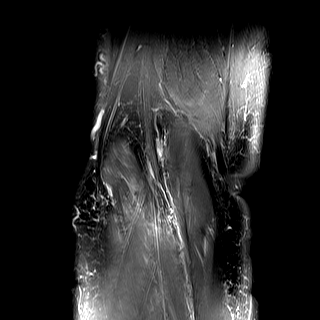
[im 9/23]
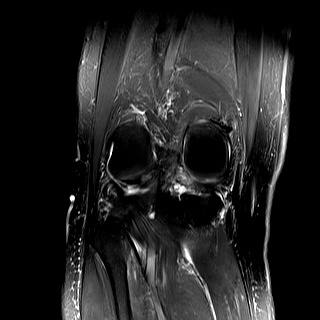
[im 14/23]
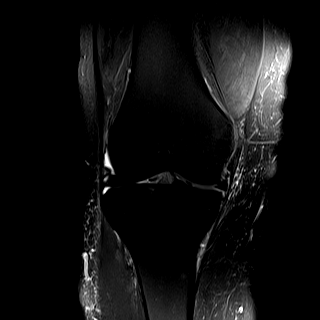
[im 18/23]
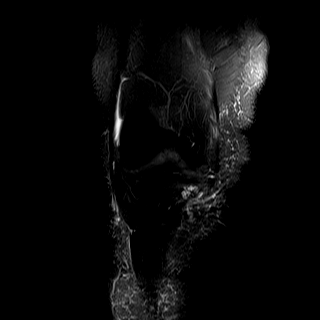
[im 23/23]
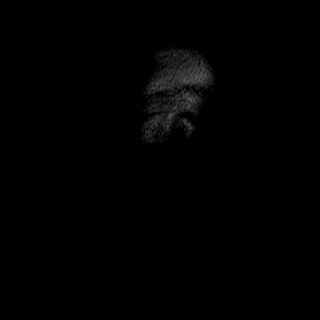

[Series 6: PD fat-sat · coronal · right · 3.0mm · 0.56mm/px · 7 of 27 slices shown (1 of 2)]
[im 1/27]
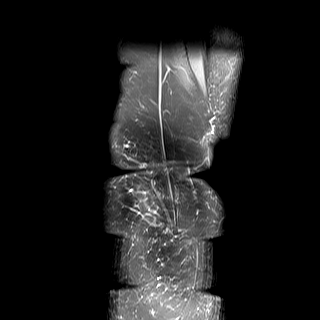
[im 5/27]
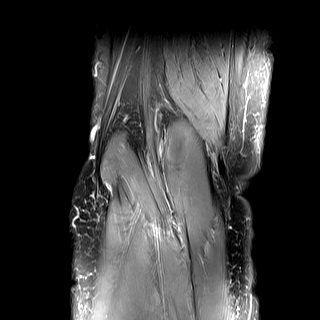
[im 9/27]
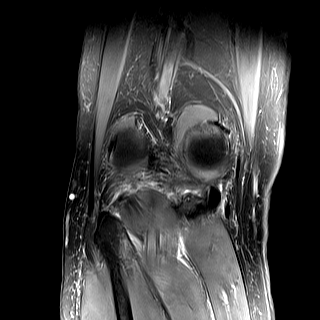
[im 14/27]
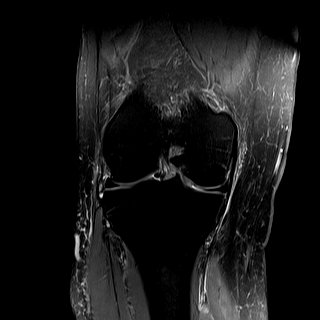
[im 18/27]
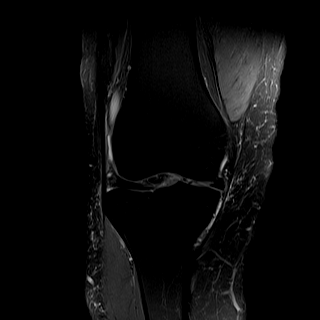
[im 22/27]
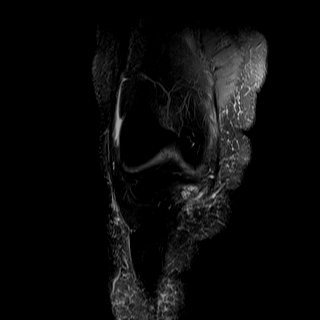
[im 27/27]
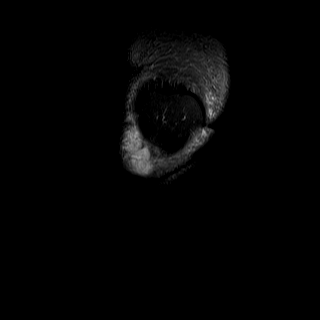

[Series 7: PD fat-sat · sagittal · right · 3.0mm · 0.70mm/px · 7 of 28 slices shown (2 of 2)]
[im 1/28]
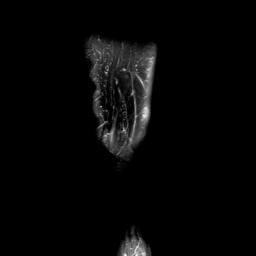
[im 5/28]
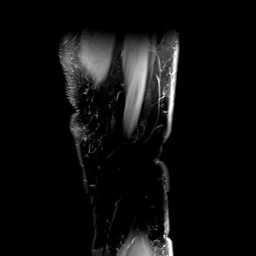
[im 10/28]
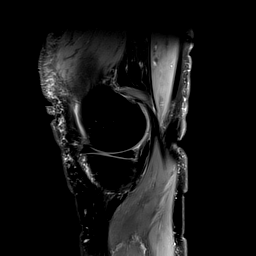
[im 14/28]
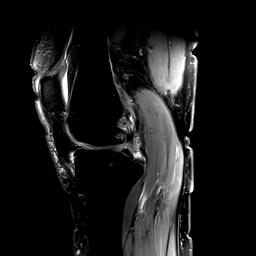
[im 19/28]
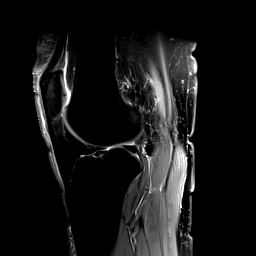
[im 23/28]
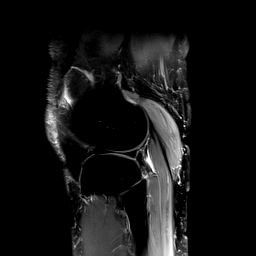
[im 28/28]
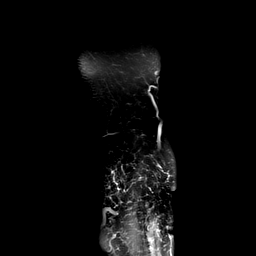

[Series 8: T2 fat-sat · sagittal · right · 3.0mm · 0.70mm/px · 7 of 28 slices shown (3 of 3)]
[im 1/28]
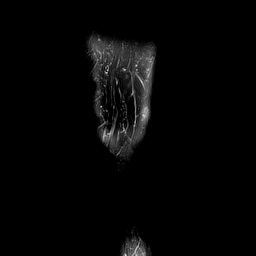
[im 5/28]
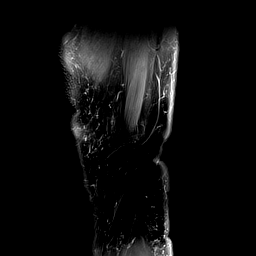
[im 10/28]
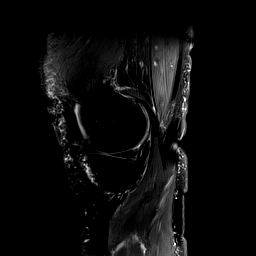
[im 14/28]
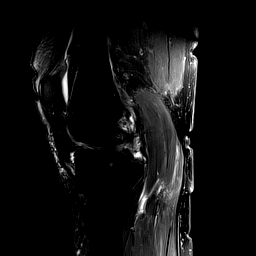
[im 19/28]
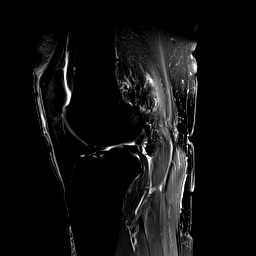
[im 23/28]
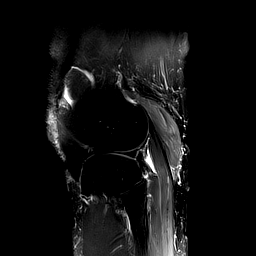
[im 28/28]
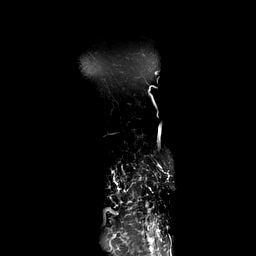

[40 of 40 positions shown; findings below may reference images not displayed]

FINDINGS: MENISCI

Medial: No discrete tear. Peripheral meniscal extrusion as can be
seen with loss of hoop strength.

Lateral: Intact.

LIGAMENTS

Cruciates: ACL and PCL are intact.

Collaterals: Medial collateral ligament is intact. Lateral
collateral ligament complex is intact.

CARTILAGE

Patellofemoral: Partial-thickness cartilage loss of the medial
patellofemoral compartment.

Medial: Partial-thickness cartilage loss of the medial femorotibial
compartment.

Lateral:  No chondral defect.

JOINT: No joint effusion. Normal [place]. No plical
thickening.

POPLITEAL FOSSA: Popliteus tendon is intact. No Baker's cyst.

EXTENSOR MECHANISM: Intact quadriceps tendon. Intact patellar
tendon. Intact lateral patellar retinaculum. Intact medial patellar
retinaculum. Intact MPFL.

BONES: No aggressive osseous lesion. No fracture or dislocation.

Other: 1.4 x 1.8 x 0.8 cm complex cystic mass along the lateral
superficial aspect of the inferior pole of the patella within the
subcutaneous fat likely reflecting a small hematoma or hemorrhagic
bursitis. Muscles are normal.
IMPRESSION: 1. 1.4 x 1.8 x 0.8 cm complex cystic mass along the lateral
superficial aspect of the inferior pole of the patella within the
subcutaneous fat likely reflecting a small hematoma or hemorrhagic
bursitis.
2. Partial-thickness cartilage loss of the medial patellofemoral
compartment.
3. Partial-thickness cartilage loss of the medial femorotibial
compartment.

## 2021-05-16 ENCOUNTER — Telehealth: Payer: Self-pay | Admitting: Sports Medicine

## 2021-05-16 NOTE — Telephone Encounter (Signed)
°  I spoke with Shannon Fields on the phone today after reviewing the MRI of her right knee.  MRI confirms what was seen on ultrasound.  There is a small complex cystic mass along the lateral superficial aspect of the inferior pole of the patella consistent with either a small hematoma or hemorrhagic bursitis.  She also has mild arthritic changes throughout the knee.  At this point in time I recommend no further treatment.  We could consider aspiration of the cyst if it becomes more symptomatic.  Follow-up as needed.

## 2021-06-15 DIAGNOSIS — D2272 Melanocytic nevi of left lower limb, including hip: Secondary | ICD-10-CM | POA: Diagnosis not present

## 2021-06-15 DIAGNOSIS — L570 Actinic keratosis: Secondary | ICD-10-CM | POA: Diagnosis not present

## 2021-06-15 DIAGNOSIS — D225 Melanocytic nevi of trunk: Secondary | ICD-10-CM | POA: Diagnosis not present

## 2021-06-15 DIAGNOSIS — D2271 Melanocytic nevi of right lower limb, including hip: Secondary | ICD-10-CM | POA: Diagnosis not present

## 2021-06-15 DIAGNOSIS — L905 Scar conditions and fibrosis of skin: Secondary | ICD-10-CM | POA: Diagnosis not present

## 2021-06-15 DIAGNOSIS — L988 Other specified disorders of the skin and subcutaneous tissue: Secondary | ICD-10-CM | POA: Diagnosis not present

## 2021-06-15 DIAGNOSIS — L821 Other seborrheic keratosis: Secondary | ICD-10-CM | POA: Diagnosis not present

## 2021-06-15 DIAGNOSIS — D1801 Hemangioma of skin and subcutaneous tissue: Secondary | ICD-10-CM | POA: Diagnosis not present

## 2021-06-15 DIAGNOSIS — D485 Neoplasm of uncertain behavior of skin: Secondary | ICD-10-CM | POA: Diagnosis not present

## 2021-06-15 DIAGNOSIS — L814 Other melanin hyperpigmentation: Secondary | ICD-10-CM | POA: Diagnosis not present

## 2021-06-15 DIAGNOSIS — L918 Other hypertrophic disorders of the skin: Secondary | ICD-10-CM | POA: Diagnosis not present

## 2021-08-04 ENCOUNTER — Other Ambulatory Visit (HOSPITAL_COMMUNITY): Payer: Self-pay

## 2021-08-04 DIAGNOSIS — D487 Neoplasm of uncertain behavior of other specified sites: Secondary | ICD-10-CM | POA: Diagnosis not present

## 2021-08-04 DIAGNOSIS — D485 Neoplasm of uncertain behavior of skin: Secondary | ICD-10-CM | POA: Diagnosis not present

## 2021-08-04 MED ORDER — MUPIROCIN 2 % EX OINT
TOPICAL_OINTMENT | Freq: Every day | CUTANEOUS | 0 refills | Status: DC
  Filled 2021-08-04: qty 22, 7d supply, fill #0

## 2021-09-07 ENCOUNTER — Other Ambulatory Visit: Payer: Self-pay | Admitting: Family Medicine

## 2021-09-07 DIAGNOSIS — E6609 Other obesity due to excess calories: Secondary | ICD-10-CM

## 2021-09-07 NOTE — Progress Notes (Signed)
nutrition referral to ensure insurance reimbursement?  Reminder: Choose AMBULATORY NUTRITION, then specify [place], so it comes to me instead of to [facility].    ?

## 2021-09-08 ENCOUNTER — Ambulatory Visit (INDEPENDENT_AMBULATORY_CARE_PROVIDER_SITE_OTHER): Payer: 59 | Admitting: Family Medicine

## 2021-09-08 DIAGNOSIS — Z713 Dietary counseling and surveillance: Secondary | ICD-10-CM | POA: Diagnosis not present

## 2021-09-08 NOTE — Progress Notes (Signed)
Medical Nutrition Therapy ?Appt start time: 0930 end time: 1030 (1 hour) ?Primary concerns today:  management of blood pressure and glucose as well as diverticulosis .  ?Relevant history/background: Shannon Fields was diagnosed with diverticulitis in early 2021, and has been making careful food choices since that time.  Has been trying to eat low-fat, low-Na, low-sugar, low-cholesterol, and to avoid foods w/ seeds or nuts.  Weight has been pretty stable since 2019, down nearly 20 lb since 2017.  Shannon Fields started a low-dose diuretic in January 2022 for BP.  Diverticulosis has been in pretty good control, but she continues to be concerned re. managing BG and BP.   ? ?Assessment:  Last fall, Shannon Fields had started to meet once a week with a personal trainer at [facility], but has not restarted since the trainer resigned and Shannon Fields had skin cancer surgery in March.  She has recently struggled some with stress eating, usually in the evenings.    ?Recent eating pattern: 2-3 meals and 1-2 snacks per day.   ?Recent usual physical activity: No consistent exercise.     ?Sleep: Estimates she gets 5-7 hrs/night (8 on weekends).  Currently enrolled in a sleep study (Inst for [employer]).   ?24-hr recall:  ?(Up at 8 AM) ?B (8:30 AM)-  1 1/4 c Cheerios, Special K hi-pro, All-Bran, <1/2 c soy milk, water, 1 c coffee, ~3 oz Orgain pro shake ?Snk ( AM)-  water ?L (1:30 PM)-  1/2 ww pita, 3 tbsp hummus, water ?Snk ( PM)-  water ?D (5 PM)-  1 fried chx sandw, aioli sauce, 3 hush puppies, Mule cocktail, water, 1/2 key lime brulee ?Snk (9 PM)-  4 ginger snaps, 1 orange, water ?Typical day? No. More typical dinner is 1/4 chx pot pie or soup&salad or chx with veg +/- starch or beans&rice.   ? ?Nutritional Diagnosis: Little progress on NI-5.7.1 Inadequate protein intake as related to disinterest in meat as well as efforts to minimize poor-quality fats as evidenced by 24-hr recall indicating intake of significant protein source only once  yesterday. ? ?Handouts given during visit include: ?After-Visit Summary (AVS) ?Meal Planning form ?Revised Goals Sheet ? ?Barriers to learning/adherence to lifestyle change: Time constraints remain a challenge.   ? ?Monitoring/Evaluation:  Dietary intake, exercise, and body weight prn. ? ?

## 2021-09-08 NOTE — Patient Instructions (Addendum)
Obtain >60 grams of protein per day; aim for ~20 g protein per meal: Make sure you get enough protein at breakfast.  If you don't want to drink more soy milk or add more pro drink to your coffee, you could add Orgain's collagen supplement to your cereal, which provides 10 g protein.   ?If you use legumes as your protein source, you need at least 1 full cup to consider adequate (= 2 oz meat). ? ?Complete the meal planning form provided today to use as your "go-to" meals.  Use as a basis for shopping so you can make any of the meals at any given time.   ? ?Design at least 3 separate strength workouts using the exercises you got from the personal trainer.  Figure out where in your home you will do your workouts, what equipment you need, and how you can keep needed equipment readily  available.  (If you'd like feedback on the workouts you create, email them to [facility].) ? ?Goals: ?Exercise at least 20 minutes other exercise 2 X wk plus 20 minutes Nordic Track 2 X.  Each weekend, plan what days you will exercise.  (Aim for consistency in days and times.)  When travelling, find out if there is a gym at the hotel, and if it's possible to walk in the area.   ? ?Eat breakfast, lunch, & dinner, conforming to recommended REAL meals (emphasis for lunch and dinner, include veg's; emphasis for breakfast, include both protein and carb). ? Remember to stock up on frozen vegetables, which will allow you to have a quick vegetable serving any time.   ?   ?Record # min of exercise; record B, L, and D for each meal that meets goal.   ?      Write down your weekly totals/averages [# of meals/7 days] at the far right of each week.   ?      Make sure you keep your Goals Sheet in a place where you see it.    ?  ?F/U in-office appt on Tuesday, May 18 at 2 PM.  ?  ?

## 2021-10-13 ENCOUNTER — Ambulatory Visit: Payer: 59 | Admitting: Family Medicine

## 2021-11-01 ENCOUNTER — Other Ambulatory Visit (HOSPITAL_COMMUNITY): Payer: Self-pay

## 2021-11-01 ENCOUNTER — Other Ambulatory Visit: Payer: Self-pay | Admitting: Family Medicine

## 2021-11-01 MED ORDER — INDAPAMIDE 1.25 MG PO TABS
1.2500 mg | ORAL_TABLET | Freq: Every day | ORAL | 0 refills | Status: DC
  Filled 2021-11-01 – 2021-11-03 (×2): qty 90, 90d supply, fill #0

## 2021-11-03 ENCOUNTER — Other Ambulatory Visit (HOSPITAL_COMMUNITY): Payer: Self-pay

## 2021-11-10 ENCOUNTER — Other Ambulatory Visit: Payer: Self-pay | Admitting: Family Medicine

## 2021-11-10 DIAGNOSIS — I10 Essential (primary) hypertension: Secondary | ICD-10-CM

## 2021-11-11 ENCOUNTER — Other Ambulatory Visit: Payer: Self-pay | Admitting: *Deleted

## 2021-11-11 ENCOUNTER — Other Ambulatory Visit: Payer: 59

## 2021-11-11 DIAGNOSIS — I10 Essential (primary) hypertension: Secondary | ICD-10-CM | POA: Diagnosis not present

## 2021-11-12 LAB — BASIC METABOLIC PANEL
BUN/Creatinine Ratio: 17 (ref 12–28)
BUN: 13 mg/dL (ref 8–27)
CO2: 26 mmol/L (ref 20–29)
Calcium: 9.8 mg/dL (ref 8.7–10.3)
Chloride: 95 mmol/L — ABNORMAL LOW (ref 96–106)
Creatinine, Ser: 0.76 mg/dL (ref 0.57–1.00)
Glucose: 97 mg/dL (ref 70–99)
Potassium: 3.9 mmol/L (ref 3.5–5.2)
Sodium: 137 mmol/L (ref 134–144)
eGFR: 90 mL/min/{1.73_m2} (ref >59)

## 2021-11-14 NOTE — Progress Notes (Signed)
    SUBJECTIVE:   CHIEF COMPLAINT / HPI:   HYPERTENSION Had been off indapamide about a year but due to higher blood pressure over the last few weeks- months started back and has been taking daily for 2 weeks although sometimes has taken 2.  Has taken one for the last 3 days.  Normal lightheadness or chest pain or new shortness of breath   PALPITATIONS Noticed more frequently than baseline when she took 2 indapamide tablets.  They have decreased since going back to one.  No sustained causing shortness of breath or chest pain .   Last echo in 2009   PERTINENT  PMH / PSH: frequent work and family stress over the last year Has not been execisng but plans to start swimming again soon  OBJECTIVE:   BP 124/82   Pulse 67   Wt 147 lb 6.4 oz (66.9 kg)   LMP 12/27/2017   SpO2 100%   BMI 26.96 kg/m   Heart - Regular rate and rhythm.  No murmurs, gallops or rubs.    Lungs:  Normal respiratory effort, chest expands symmetrically. Lungs are clear to auscultation, no crackles or wheezes. Extremities:  No cyanosis, edema, or deformity noted with good range of motion of all major joints.    ECG - NSR no ST changes.  No appreciable changes from last ECG  ASSESSMENT/PLAN:   Hypertension Continue indapamide one daily and monitor blood pressure at home.  Will let me know if running high  Palpitations None now on ECG or exam. Recent bmet unremarkable.  Monitor at home.  If worsening or new symptoms would refer for prolonged monitoring   Hyperglycemia Normal on recent BMET   Patient Instructions  Good to see you today - Thank you for coming in  Things we discussed today:  Palpitations - if continue to bother you then would refer to cardiology for monitor \  Keep taking one Indapamide a day   Your goal blood pressure is less than 140/90.  Check your blood pressure several times a week.  If regularly higher than this please let me know - either with MyChart or leaving a phone message. Next  visit please bring in your blood pressure cuff.     Please always bring your medication bottles  Come back to see me for a physical    Lind Covert, [place]

## 2021-11-15 ENCOUNTER — Ambulatory Visit (INDEPENDENT_AMBULATORY_CARE_PROVIDER_SITE_OTHER): Payer: 59 | Admitting: Family Medicine

## 2021-11-15 ENCOUNTER — Encounter: Payer: Self-pay | Admitting: Family Medicine

## 2021-11-15 ENCOUNTER — Ambulatory Visit: Payer: 59 | Admitting: Family Medicine

## 2021-11-15 DIAGNOSIS — R739 Hyperglycemia, unspecified: Secondary | ICD-10-CM | POA: Diagnosis not present

## 2021-11-15 DIAGNOSIS — R002 Palpitations: Secondary | ICD-10-CM

## 2021-11-15 DIAGNOSIS — I10 Essential (primary) hypertension: Secondary | ICD-10-CM

## 2021-11-15 NOTE — Assessment & Plan Note (Signed)
None now on ECG or exam. Recent bmet unremarkable.  Monitor at home.  If worsening or new symptoms would refer for prolonged monitoring

## 2021-11-15 NOTE — Patient Instructions (Signed)
Good to see you today - Thank you for coming in  Things we discussed today:  Palpitations - if continue to bother you then would refer to cardiology for monitor \  Keep taking one Indapamide a day   Your goal blood pressure is less than 140/90.  Check your blood pressure several times a week.  If regularly higher than this please let me know - either with MyChart or leaving a phone message. Next visit please bring in your blood pressure cuff.     Please always bring your medication bottles  Come back to see me for a physical

## 2021-11-15 NOTE — Assessment & Plan Note (Signed)
Continue indapamide one daily and monitor blood pressure at home.  Will let me know if running high

## 2021-11-15 NOTE — Assessment & Plan Note (Signed)
Normal on recent BMET

## 2022-01-18 ENCOUNTER — Ambulatory Visit (INDEPENDENT_AMBULATORY_CARE_PROVIDER_SITE_OTHER): Payer: 59 | Admitting: Family Medicine

## 2022-01-18 ENCOUNTER — Encounter: Payer: Self-pay | Admitting: Family Medicine

## 2022-01-18 DIAGNOSIS — Z6830 Body mass index (BMI) 30.0-30.9, adult: Secondary | ICD-10-CM | POA: Diagnosis not present

## 2022-01-18 DIAGNOSIS — E6609 Other obesity due to excess calories: Secondary | ICD-10-CM

## 2022-01-18 DIAGNOSIS — R739 Hyperglycemia, unspecified: Secondary | ICD-10-CM

## 2022-01-18 DIAGNOSIS — I10 Essential (primary) hypertension: Secondary | ICD-10-CM

## 2022-01-18 DIAGNOSIS — E66811 Obesity, class 1: Secondary | ICD-10-CM

## 2022-01-18 NOTE — Assessment & Plan Note (Signed)
Weight is decreasing.  Watch for changes during winter when swimming stops

## 2022-01-18 NOTE — Patient Instructions (Signed)
Good to see you today - Thank you for coming in  Things we discussed today:  Your goal blood pressure is less than 140/90.  Check your blood pressure several times a week.  If regularly higher than this please let me know - either with MyChart or leaving a phone message. Next visit please bring in your blood pressure cuff.     I will call you if your tests are not good.  Otherwise, I will send you a message on MyChart (if it is active) or a letter in the mail..  If you do not hear from me with in 2 weeks please call our office.     Get your Zoster vaccines to help prevent Shingles.  The shot may cause a sore arm and mild flu like symptoms for a few days.  You will need a second shot 2 months after your first.

## 2022-01-18 NOTE — Assessment & Plan Note (Signed)
Will recheck on bmet

## 2022-01-18 NOTE — Progress Notes (Signed)
    SUBJECTIVE:   CHIEF COMPLAINT / HPI:   Hypertension Has blood pressure cuff at home   Readings vary. No persistent chest pain or lightheadness or palpitations.  Takes indapamide regularly  ASCVD Risk Takes fish oil daily  The 10-year ASCVD risk score (Arnett DK, et al., 2019) is: 4.2%   Values used to calculate the score:     Age: 61 years     Sex: Female     Is Non-Hispanic African American: No     Diabetic: No     Tobacco smoker: No     Systolic Blood Pressure: 517 mmHg     Is BP treated: Yes     HDL Cholesterol: 90 mg/dL     Total Cholesterol: 213 mg/dL   Weight Down 2 lbs.  Is swimming and going to nutrition  PERTINENT  PMH / [employer] at home 3 days a week for [employer]  OBJECTIVE:   BP (!) 144/82   Pulse 65   Wt 145 lb 9.6 oz (66 kg)   LMP 12/27/2017   SpO2 100%   BMI 26.63 kg/m   Heart - Regular rate and rhythm.  No murmurs, gallops or rubs.    Lungs:  Normal respiratory effort, chest expands symmetrically. Lungs are clear to auscultation, no crackles or wheezes. Extremities:  No cyanosis, edema, or deformity noted with good range of motion of all major joints.     ASSESSMENT/PLAN:   Hypertension Mostly at goal by home readings.  Rare elevated diastolic.  Continue current indapamide and home monitoring.  Check bmet   Hyperglycemia Will recheck on bmet  OBESITY, UNSPECIFIED Weight is decreasing.  Watch for changes during winter when swimming stops     Annual Examination Female over 74 yo  I reviewed the following patient responses on our Physical Exam Form Tobacco use and candidacy for lung cancer screening Alcohol Use  Weight  Exercise  Risk for STI  Fall risk Advanced Directive Increased family cancer risk Violence risk  PHQ9 score reviewed  Blood pressure reviewed  I considered the following items based upon USPSTF recommendations: Cervical Cancer if female at birth Mammogram Colon cancer screening Osteoporosis screening  - Gyn?   HIV testing:  Hepatitis C testing Cholesterol screening - today STI screening if high risk (Hepatitis B, Syphilis, Gonorrhea, Chlamydia) Immunizations - Influenza, Covid, Shingle, Pneumonia, Tetanus  See After Visit Summary for recommendations  Patient Instructions  Good to see you today - Thank you for coming in  Things we discussed today:  Your goal blood pressure is less than 140/90.  Check your blood pressure several times a week.  If regularly higher than this please let me know - either with MyChart or leaving a phone message. Next visit please bring in your blood pressure cuff.     I will call you if your tests are not good.  Otherwise, I will send you a message on MyChart (if it is active) or a letter in the mail..  If you do not hear from me with in 2 weeks please call our office.     Get your Zoster vaccines to help prevent Shingles.  The shot may cause a sore arm and mild flu like symptoms for a few days.  You will need a second shot 2 months after your first.       Lind Covert, [place]

## 2022-01-18 NOTE — Assessment & Plan Note (Signed)
Mostly at goal by home readings.  Rare elevated diastolic.  Continue current indapamide and home monitoring.  Check bmet

## 2022-01-19 ENCOUNTER — Encounter: Payer: Self-pay | Admitting: Family Medicine

## 2022-01-19 DIAGNOSIS — H43813 Vitreous degeneration, bilateral: Secondary | ICD-10-CM | POA: Diagnosis not present

## 2022-01-19 DIAGNOSIS — H2513 Age-related nuclear cataract, bilateral: Secondary | ICD-10-CM | POA: Diagnosis not present

## 2022-01-19 DIAGNOSIS — H5213 Myopia, bilateral: Secondary | ICD-10-CM | POA: Diagnosis not present

## 2022-01-19 DIAGNOSIS — H52203 Unspecified astigmatism, bilateral: Secondary | ICD-10-CM | POA: Diagnosis not present

## 2022-01-19 LAB — BASIC METABOLIC PANEL
BUN/Creatinine Ratio: 13 (ref 12–28)
BUN: 10 mg/dL (ref 8–27)
CO2: 23 mmol/L (ref 20–29)
Calcium: 9.9 mg/dL (ref 8.7–10.3)
Chloride: 99 mmol/L (ref 96–106)
Creatinine, Ser: 0.78 mg/dL (ref 0.57–1.00)
Glucose: 112 mg/dL — ABNORMAL HIGH (ref 70–99)
Potassium: 4.2 mmol/L (ref 3.5–5.2)
Sodium: 142 mmol/L (ref 134–144)
eGFR: 87 mL/min/{1.73_m2} (ref >59)

## 2022-01-19 LAB — LIPID PANEL
Chol/HDL Ratio: 2.5 ratio (ref 0.0–4.4)
Cholesterol, Total: 231 mg/dL — ABNORMAL HIGH (ref 100–199)
HDL: 92 mg/dL (ref >39)
LDL Chol Calc (NIH): 126 mg/dL — ABNORMAL HIGH (ref 0–99)
Triglycerides: 79 mg/dL (ref 0–149)
VLDL Cholesterol Cal: 13 mg/dL (ref 5–40)

## 2022-01-23 DIAGNOSIS — I8392 Asymptomatic varicose veins of left lower extremity: Secondary | ICD-10-CM | POA: Diagnosis not present

## 2022-01-23 DIAGNOSIS — L821 Other seborrheic keratosis: Secondary | ICD-10-CM | POA: Diagnosis not present

## 2022-01-23 DIAGNOSIS — D2271 Melanocytic nevi of right lower limb, including hip: Secondary | ICD-10-CM | POA: Diagnosis not present

## 2022-01-23 DIAGNOSIS — L820 Inflamed seborrheic keratosis: Secondary | ICD-10-CM | POA: Diagnosis not present

## 2022-01-31 ENCOUNTER — Other Ambulatory Visit: Payer: Self-pay | Admitting: Family Medicine

## 2022-01-31 DIAGNOSIS — Z1231 Encounter for screening mammogram for malignant neoplasm of breast: Secondary | ICD-10-CM

## 2022-02-01 ENCOUNTER — Other Ambulatory Visit (HOSPITAL_COMMUNITY): Payer: Self-pay

## 2022-02-01 ENCOUNTER — Other Ambulatory Visit: Payer: Self-pay | Admitting: Family Medicine

## 2022-02-01 MED ORDER — INDAPAMIDE 1.25 MG PO TABS
1.2500 mg | ORAL_TABLET | Freq: Every day | ORAL | 3 refills | Status: DC
  Filled 2022-02-01: qty 90, 90d supply, fill #0

## 2022-02-14 ENCOUNTER — Ambulatory Visit: Payer: 59 | Admitting: Family Medicine

## 2022-02-15 ENCOUNTER — Ambulatory Visit: Payer: 59 | Admitting: Family Medicine

## 2022-03-09 ENCOUNTER — Ambulatory Visit: Payer: 59

## 2022-03-09 ENCOUNTER — Ambulatory Visit
Admission: RE | Admit: 2022-03-09 | Discharge: 2022-03-09 | Disposition: A | Discharge status: Home / Self Care | Payer: 59 | Source: Ambulatory Visit | Attending: Family Medicine | Admitting: Family Medicine

## 2022-03-09 DIAGNOSIS — Z1231 Encounter for screening mammogram for malignant neoplasm of breast: Secondary | ICD-10-CM | POA: Diagnosis not present

## 2022-04-04 ENCOUNTER — Other Ambulatory Visit: Payer: Self-pay | Admitting: Family Medicine

## 2022-04-04 ENCOUNTER — Other Ambulatory Visit (HOSPITAL_COMMUNITY): Payer: Self-pay

## 2022-04-04 MED ORDER — INDAPAMIDE 1.25 MG PO TABS
2.5000 mg | ORAL_TABLET | Freq: Every day | ORAL | 2 refills | Status: DC
  Filled 2022-04-04 – 2022-04-05 (×2): qty 180, 90d supply, fill #0
  Filled 2022-07-03 (×2): qty 180, 90d supply, fill #1
  Filled 2022-10-01: qty 180, 90d supply, fill #2

## 2022-04-05 ENCOUNTER — Other Ambulatory Visit (HOSPITAL_COMMUNITY): Payer: Self-pay

## 2022-04-06 ENCOUNTER — Other Ambulatory Visit (HOSPITAL_COMMUNITY): Payer: Self-pay

## 2022-04-27 DIAGNOSIS — Z13820 Encounter for screening for osteoporosis: Secondary | ICD-10-CM | POA: Diagnosis not present

## 2022-05-01 DIAGNOSIS — Z1151 Encounter for screening for human papillomavirus (HPV): Secondary | ICD-10-CM | POA: Diagnosis not present

## 2022-05-01 DIAGNOSIS — Z6827 Body mass index (BMI) 27.0-27.9, adult: Secondary | ICD-10-CM | POA: Diagnosis not present

## 2022-05-01 DIAGNOSIS — Z01419 Encounter for gynecological examination (general) (routine) without abnormal findings: Secondary | ICD-10-CM | POA: Diagnosis not present

## 2022-05-01 DIAGNOSIS — Z124 Encounter for screening for malignant neoplasm of cervix: Secondary | ICD-10-CM | POA: Diagnosis not present

## 2022-05-01 DIAGNOSIS — M8580 Other specified disorders of bone density and structure, unspecified site: Secondary | ICD-10-CM | POA: Diagnosis not present

## 2022-05-11 ENCOUNTER — Other Ambulatory Visit (HOSPITAL_COMMUNITY): Payer: Self-pay

## 2022-05-11 DIAGNOSIS — R300 Dysuria: Secondary | ICD-10-CM | POA: Diagnosis not present

## 2022-05-11 DIAGNOSIS — N760 Acute vaginitis: Secondary | ICD-10-CM | POA: Diagnosis not present

## 2022-05-11 MED ORDER — METRONIDAZOLE 500 MG PO TABS
500.0000 mg | ORAL_TABLET | Freq: Two times a day (BID) | ORAL | 0 refills | Status: DC
Start: 2022-05-11 — End: 2023-01-30
  Filled 2022-05-11: qty 10, 5d supply, fill #0

## 2022-05-11 MED ORDER — NYSTATIN-TRIAMCINOLONE 100000-0.1 UNIT/GM-% EX OINT
TOPICAL_OINTMENT | Freq: Two times a day (BID) | CUTANEOUS | 0 refills | Status: DC
Start: 2022-05-11 — End: 2023-01-30
  Filled 2022-05-11: qty 30, 30d supply, fill #0

## 2022-05-12 ENCOUNTER — Other Ambulatory Visit (HOSPITAL_COMMUNITY): Payer: Self-pay

## 2022-05-12 MED ORDER — SULFAMETHOXAZOLE-TRIMETHOPRIM 800-160 MG PO TABS
1.0000 | ORAL_TABLET | Freq: Two times a day (BID) | ORAL | 0 refills | Status: AC
  Filled 2022-05-12: qty 10, 5d supply, fill #0

## 2022-05-17 ENCOUNTER — Other Ambulatory Visit (HOSPITAL_COMMUNITY): Payer: Self-pay

## 2022-05-18 ENCOUNTER — Other Ambulatory Visit (HOSPITAL_COMMUNITY): Payer: Self-pay

## 2022-05-18 MED ORDER — SULFAMETHOXAZOLE-TRIMETHOPRIM 800-160 MG PO TABS
1.0000 | ORAL_TABLET | Freq: Two times a day (BID) | ORAL | 0 refills | Status: DC
  Filled 2022-05-18: qty 6, 3d supply, fill #0

## 2022-05-23 ENCOUNTER — Other Ambulatory Visit (HOSPITAL_COMMUNITY): Payer: Self-pay

## 2022-06-26 ENCOUNTER — Other Ambulatory Visit: Payer: Self-pay | Admitting: Family Medicine

## 2022-07-03 ENCOUNTER — Other Ambulatory Visit: Payer: Self-pay

## 2022-07-03 ENCOUNTER — Other Ambulatory Visit (HOSPITAL_COMMUNITY): Payer: Self-pay

## 2022-07-04 ENCOUNTER — Other Ambulatory Visit (HOSPITAL_COMMUNITY): Payer: Self-pay

## 2022-07-20 DIAGNOSIS — L821 Other seborrheic keratosis: Secondary | ICD-10-CM | POA: Diagnosis not present

## 2022-07-20 DIAGNOSIS — L814 Other melanin hyperpigmentation: Secondary | ICD-10-CM | POA: Diagnosis not present

## 2022-07-20 DIAGNOSIS — L820 Inflamed seborrheic keratosis: Secondary | ICD-10-CM | POA: Diagnosis not present

## 2022-07-20 DIAGNOSIS — D225 Melanocytic nevi of trunk: Secondary | ICD-10-CM | POA: Diagnosis not present

## 2022-07-20 DIAGNOSIS — D1801 Hemangioma of skin and subcutaneous tissue: Secondary | ICD-10-CM | POA: Diagnosis not present

## 2022-07-20 DIAGNOSIS — D485 Neoplasm of uncertain behavior of skin: Secondary | ICD-10-CM | POA: Diagnosis not present

## 2022-07-20 DIAGNOSIS — L570 Actinic keratosis: Secondary | ICD-10-CM | POA: Diagnosis not present

## 2022-07-20 DIAGNOSIS — D2272 Melanocytic nevi of left lower limb, including hip: Secondary | ICD-10-CM | POA: Diagnosis not present

## 2022-07-20 DIAGNOSIS — L7211 Pilar cyst: Secondary | ICD-10-CM | POA: Diagnosis not present

## 2022-10-02 ENCOUNTER — Other Ambulatory Visit (HOSPITAL_COMMUNITY): Payer: Self-pay

## 2022-10-03 ENCOUNTER — Other Ambulatory Visit (HOSPITAL_COMMUNITY): Payer: Self-pay

## 2022-12-28 ENCOUNTER — Other Ambulatory Visit: Payer: Self-pay | Admitting: Family Medicine

## 2022-12-28 ENCOUNTER — Other Ambulatory Visit (HOSPITAL_COMMUNITY): Payer: Self-pay

## 2022-12-28 DIAGNOSIS — I10 Essential (primary) hypertension: Secondary | ICD-10-CM

## 2022-12-28 MED ORDER — INDAPAMIDE 1.25 MG PO TABS
2.5000 mg | ORAL_TABLET | Freq: Every day | ORAL | 0 refills | Status: DC
  Filled 2022-12-28: qty 180, 90d supply, fill #0

## 2023-01-18 DIAGNOSIS — D225 Melanocytic nevi of trunk: Secondary | ICD-10-CM | POA: Diagnosis not present

## 2023-01-18 DIAGNOSIS — L237 Allergic contact dermatitis due to plants, except food: Secondary | ICD-10-CM | POA: Diagnosis not present

## 2023-01-18 DIAGNOSIS — D1801 Hemangioma of skin and subcutaneous tissue: Secondary | ICD-10-CM | POA: Diagnosis not present

## 2023-01-18 DIAGNOSIS — L821 Other seborrheic keratosis: Secondary | ICD-10-CM | POA: Diagnosis not present

## 2023-01-24 DIAGNOSIS — H5213 Myopia, bilateral: Secondary | ICD-10-CM | POA: Diagnosis not present

## 2023-01-30 ENCOUNTER — Ambulatory Visit (INDEPENDENT_AMBULATORY_CARE_PROVIDER_SITE_OTHER): Payer: Commercial Managed Care - PPO | Admitting: Family Medicine

## 2023-01-30 ENCOUNTER — Encounter: Payer: Self-pay | Admitting: Family Medicine

## 2023-01-30 ENCOUNTER — Other Ambulatory Visit: Payer: Self-pay

## 2023-01-30 ENCOUNTER — Other Ambulatory Visit (HOSPITAL_COMMUNITY): Payer: Self-pay

## 2023-01-30 VITALS — BP 134/80 | HR 60 | Ht 62.0 in | Wt 140.2 lb

## 2023-01-30 DIAGNOSIS — Z1211 Encounter for screening for malignant neoplasm of colon: Secondary | ICD-10-CM

## 2023-01-30 DIAGNOSIS — I10 Essential (primary) hypertension: Secondary | ICD-10-CM

## 2023-01-30 MED ORDER — TRIAMCINOLONE ACETONIDE 0.1 % EX CREA
TOPICAL_CREAM | Freq: Two times a day (BID) | CUTANEOUS | 0 refills | Status: DC | PRN
  Filled 2023-01-30: qty 80, 10d supply, fill #0
  Filled 2023-02-19: qty 80, 30d supply, fill #0

## 2023-01-30 NOTE — Assessment & Plan Note (Signed)
Controlled on current lozol 2.5 mg daily.  Continue and check labs.  Her goal is to eventually decrease her medications with diet and exercise

## 2023-01-30 NOTE — Progress Notes (Signed)
    SUBJECTIVE:   CHIEF COMPLAINT / HPI:   Hypertension - well controlled at home taking Lozol 2 tabs (2.5 mg) daily  Exercising regularly - swimming during the summer  Family and life generally less stressful   OBJECTIVE:   BP 134/80   Pulse 60   Ht 5\' 2"  (1.575 m)   Wt 140 lb 3.2 oz (63.6 kg)   LMP 12/27/2017   SpO2 99%   BMI 25.64 kg/m   Heart - Regular rate and rhythm.  No murmurs, gallops or rubs.    Lungs:  Normal respiratory effort, chest expands symmetrically. Lungs are clear to auscultation, no crackles or wheezes. Extremities:  No cyanosis, edema, or deformity noted with good range of motion of all major joints.   Mobility:able to get up and down from exam table without assistance or distress   ASSESSMENT/PLAN:   Primary hypertension Assessment & Plan: Controlled on current lozol 2.5 mg daily.  Continue and check labs.  Her goal is to eventually decrease her medications with diet and exercise  Orders: -     CBC -     CMP14+EGFR -     Lipid panel  Screen for colon cancer -     Ambulatory referral to Gastroenterology   Annual Examination Female over 82 yo  I reviewed the following patient responses on our Physical Exam Form Tobacco use and candidacy for lung cancer screening Alcohol Use  Weight  Exercise  Risk for STI  Fall risk Increased family cancer risk Violence risk  PHQ9 score reviewed  Blood pressure reviewed  I considered the following items based upon USPSTF recommendations: Cervical Cancer if female at birth - at her Gyn Mammogram Colon cancer screening - put in referral to [facility] Osteoporosis screening - NI HIV testing:  Hepatitis C testing Cholesterol screening - today STI screening if high risk (Hepatitis B, Syphilis, Gonorrhea, Chlamydia) Immunizations - Influenza, Covid, Shingle, Pneumonia, Tetanus  See After Visit Summary for recommendations  Patient Instructions  Good to see you today - Thank you for coming  in  Things we discussed today:  I put in a referral to GI  Consider your winter exercise  I will call you if your tests are not good.  Otherwise, I will send you a message on MyChart (if it is active) or a letter in the mail..  If you do not hear from me with in 2 weeks please call our office.        Carney Living, MD [facility]

## 2023-01-30 NOTE — Patient Instructions (Addendum)
Good to see you today - Thank you for coming in  Things we discussed today:  I put in a referral to GI  Consider your winter exercise  I will call you if your tests are not good.  Otherwise, I will send you a message on MyChart (if it is active) or a letter in the mail..  If you do not hear from me with in 2 weeks please call our office.

## 2023-01-31 ENCOUNTER — Encounter: Payer: Self-pay | Admitting: Family Medicine

## 2023-01-31 LAB — CMP14+EGFR
ALT: 20 IU/L (ref 0–32)
AST: 19 IU/L (ref 0–40)
Albumin: 4.8 g/dL (ref 3.9–4.9)
Alkaline Phosphatase: 54 IU/L (ref 44–121)
BUN/Creatinine Ratio: 17 (ref 12–28)
BUN: 12 mg/dL (ref 8–27)
Bilirubin Total: 0.6 mg/dL (ref 0.0–1.2)
CO2: 26 mmol/L (ref 20–29)
Calcium: 9.9 mg/dL (ref 8.7–10.3)
Chloride: 96 mmol/L (ref 96–106)
Creatinine, Ser: 0.71 mg/dL (ref 0.57–1.00)
Globulin, Total: 2.3 g/dL (ref 1.5–4.5)
Glucose: 105 mg/dL — ABNORMAL HIGH (ref 70–99)
Potassium: 3.8 mmol/L (ref 3.5–5.2)
Sodium: 138 mmol/L (ref 134–144)
Total Protein: 7.1 g/dL (ref 6.0–8.5)
eGFR: 97 mL/min/{1.73_m2} (ref >59)

## 2023-01-31 LAB — LIPID PANEL
Chol/HDL Ratio: 2.7 ratio (ref 0.0–4.4)
Cholesterol, Total: 228 mg/dL — ABNORMAL HIGH (ref 100–199)
HDL: 84 mg/dL (ref >39)
LDL Chol Calc (NIH): 130 mg/dL — ABNORMAL HIGH (ref 0–99)
Triglycerides: 83 mg/dL (ref 0–149)
VLDL Cholesterol Cal: 14 mg/dL (ref 5–40)

## 2023-01-31 LAB — CBC
Hematocrit: 42.5 % (ref 34.0–46.6)
Hemoglobin: 14.7 g/dL (ref 11.1–15.9)
MCH: 31.1 pg (ref 26.6–33.0)
MCHC: 34.6 g/dL (ref 31.5–35.7)
MCV: 90 fL (ref 79–97)
Platelets: 290 10*3/uL (ref 150–450)
RBC: 4.72 x10E6/uL (ref 3.77–5.28)
RDW: 11.8 % (ref 11.7–15.4)
WBC: 6.8 10*3/uL (ref 3.4–10.8)

## 2023-02-02 ENCOUNTER — Encounter: Payer: Self-pay | Admitting: Gastroenterology

## 2023-02-02 ENCOUNTER — Other Ambulatory Visit: Payer: Self-pay | Admitting: Family Medicine

## 2023-02-02 DIAGNOSIS — Z1231 Encounter for screening mammogram for malignant neoplasm of breast: Secondary | ICD-10-CM

## 2023-02-08 ENCOUNTER — Other Ambulatory Visit (HOSPITAL_COMMUNITY): Payer: Self-pay

## 2023-02-19 ENCOUNTER — Other Ambulatory Visit (HOSPITAL_COMMUNITY): Payer: Self-pay

## 2023-02-19 ENCOUNTER — Other Ambulatory Visit (INDEPENDENT_AMBULATORY_CARE_PROVIDER_SITE_OTHER): Payer: Commercial Managed Care - PPO

## 2023-02-19 ENCOUNTER — Other Ambulatory Visit: Payer: Self-pay | Admitting: Family Medicine

## 2023-02-19 DIAGNOSIS — R300 Dysuria: Secondary | ICD-10-CM | POA: Diagnosis not present

## 2023-02-19 LAB — POCT UA - MICROSCOPIC ONLY
Epithelial cells, urine per micros: NONE SEEN
WBC, Ur, HPF, POC: >20 (ref 0–5)

## 2023-02-19 LAB — POCT URINALYSIS DIP (MANUAL ENTRY)
Bilirubin, UA: NEGATIVE
Glucose, UA: NEGATIVE mg/dL
Ketones, POC UA: NEGATIVE mg/dL
Nitrite, UA: NEGATIVE
Protein Ur, POC: NEGATIVE mg/dL
Spec Grav, UA: 1.015 (ref 1.010–1.025)
Urobilinogen, UA: 0.2 E.U./dL
pH, UA: 6.5 (ref 5.0–8.0)

## 2023-02-19 MED ORDER — CEPHALEXIN 500 MG PO CAPS
500.0000 mg | ORAL_CAPSULE | Freq: Three times a day (TID) | ORAL | 0 refills | Status: AC
  Filled 2023-02-19: qty 15, 5d supply, fill #0

## 2023-02-19 NOTE — Addendum Note (Signed)
Addended by: Pearlean Brownie L on: 02/19/2023 04:44 PM   Modules accepted: Orders

## 2023-02-19 NOTE — Progress Notes (Signed)
Having UTI symptoms.  No fevers or chills or vaginal discharge  Will check UA and urine culture

## 2023-02-22 LAB — URINE CULTURE

## 2023-02-24 LAB — URINE CULTURE

## 2023-03-12 ENCOUNTER — Ambulatory Visit
Admission: RE | Admit: 2023-03-12 | Discharge: 2023-03-12 | Disposition: A | Discharge status: Home / Self Care | Payer: Commercial Managed Care - PPO | Source: Ambulatory Visit | Attending: Family Medicine | Admitting: Family Medicine

## 2023-03-12 ENCOUNTER — Other Ambulatory Visit (HOSPITAL_BASED_OUTPATIENT_CLINIC_OR_DEPARTMENT_OTHER): Payer: Self-pay

## 2023-03-12 DIAGNOSIS — Z1231 Encounter for screening mammogram for malignant neoplasm of breast: Secondary | ICD-10-CM

## 2023-03-20 ENCOUNTER — Other Ambulatory Visit (HOSPITAL_COMMUNITY): Payer: Self-pay

## 2023-03-20 ENCOUNTER — Other Ambulatory Visit: Payer: Self-pay | Admitting: Family Medicine

## 2023-03-20 DIAGNOSIS — I10 Essential (primary) hypertension: Secondary | ICD-10-CM

## 2023-03-20 MED ORDER — INDAPAMIDE 1.25 MG PO TABS
2.5000 mg | ORAL_TABLET | Freq: Every day | ORAL | 1 refills | Status: DC
  Filled 2023-03-20: qty 180, 90d supply, fill #0
  Filled 2023-06-27: qty 180, 90d supply, fill #1

## 2023-03-21 ENCOUNTER — Other Ambulatory Visit (HOSPITAL_COMMUNITY): Payer: Self-pay

## 2023-04-20 ENCOUNTER — Encounter: Payer: Self-pay | Admitting: Family Medicine

## 2023-05-16 DIAGNOSIS — Z01419 Encounter for gynecological examination (general) (routine) without abnormal findings: Secondary | ICD-10-CM | POA: Diagnosis not present

## 2023-05-16 DIAGNOSIS — I10 Essential (primary) hypertension: Secondary | ICD-10-CM | POA: Diagnosis not present

## 2023-05-16 DIAGNOSIS — Z6826 Body mass index (BMI) 26.0-26.9, adult: Secondary | ICD-10-CM | POA: Diagnosis not present

## 2023-05-17 ENCOUNTER — Other Ambulatory Visit (HOSPITAL_BASED_OUTPATIENT_CLINIC_OR_DEPARTMENT_OTHER): Payer: Self-pay | Admitting: Obstetrics and Gynecology

## 2023-05-17 DIAGNOSIS — Z139 Encounter for screening, unspecified: Secondary | ICD-10-CM

## 2023-06-01 ENCOUNTER — Ambulatory Visit
Admission: RE | Admit: 2023-06-01 | Discharge: 2023-06-01 | Disposition: A | Discharge status: Home / Self Care | Payer: Commercial Managed Care - PPO | Source: Ambulatory Visit | Attending: Obstetrics and Gynecology | Admitting: Obstetrics and Gynecology

## 2023-06-01 DIAGNOSIS — Z139 Encounter for screening, unspecified: Secondary | ICD-10-CM | POA: Insufficient documentation

## 2023-06-05 ENCOUNTER — Ambulatory Visit: Payer: Commercial Managed Care - PPO | Admitting: Gastroenterology

## 2023-06-05 ENCOUNTER — Encounter: Payer: Self-pay | Admitting: Gastroenterology

## 2023-06-05 VITALS — BP 148/84 | HR 70 | Ht 62.0 in | Wt 147.0 lb

## 2023-06-05 DIAGNOSIS — R1310 Dysphagia, unspecified: Secondary | ICD-10-CM

## 2023-06-05 DIAGNOSIS — R931 Abnormal findings on diagnostic imaging of heart and coronary circulation: Secondary | ICD-10-CM | POA: Diagnosis not present

## 2023-06-05 DIAGNOSIS — Z8719 Personal history of other diseases of the digestive system: Secondary | ICD-10-CM | POA: Diagnosis not present

## 2023-06-05 DIAGNOSIS — K219 Gastro-esophageal reflux disease without esophagitis: Secondary | ICD-10-CM

## 2023-06-05 DIAGNOSIS — Z1211 Encounter for screening for malignant neoplasm of colon: Secondary | ICD-10-CM

## 2023-06-05 NOTE — Progress Notes (Signed)
 HPI :  63 year old female with a history of diverticulosis/diverticulitis, GERD, dysphagia, here to establish care for some of these issues.  She states she has had GERD for 10 to 15 years.  Has essentially been using Pepcid as needed for this and generally symptoms have really not bothered her too much lately.  She does not take Pepcid daily, only as needed.  Over the past 6 months she is developed some dysphagia.  She has dysphagia to bulkier larger solid foods that can get hung up in her mid chest.  She drinks fluids to help push things through which can help.  Sometimes this can be uncomfortable.  She had an EGD at the same time of her last colonoscopy, she thinks around 2013.  She did not have dysphagia at that time.  She had the studies done at [facility], I do not have any formal reports of those test at this time.  She thinks her last colonoscopy was in 2013, she is not sure if she had a polyp or not removed.  Since that time she has had 3 episodes of diverticulitis.  I see a CT scan in February 2021 showing left-sided diverticulitis.  She states her last episode was in August 2024.  Her symptoms are typically left lower sided pain.  She currently denies any problems with her bowels.  No blood in her stools.  No family history of colon cancer.  She generally feels well without complaints.  Of note she just recently had a cardiac CT done, she has not seen the results.  Coronary calcium  score is 662 which is 90th percentile for her age.  They recommended cardiology consultation.  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  That evaluation is pending.  She denies any exertional chest pain or shortness of breath.  No family history of heart disease   Colonoscopy 2013 - no report available   Past Medical History:  Diagnosis Date   Diverticulosis    History of diverticulitis 06/2019   History of  gastroesophageal reflux (GERD)    Osteopenia    PMB (postmenopausal bleeding)    Uterine fibroid    Uterine polyp 07/10/2012   Wears glasses    White coat syndrome without diagnosis of hypertension      Past Surgical History:  Procedure Laterality Date   ABDOMINOPLASTY  12-02-2001  @[facility]    AND VENTRAL HERNIA REPAIR   CESAREAN SECTION  x2  last one 07-05-2020 @ [facility]   AND BILATERAL TUBAL LIGATION W/ LAST C/S   DILATATION & CURETTAGE/HYSTEROSCOPY WITH MYOSURE N/A 06/10/2020   Procedure: DILATATION & CURETTAGE/HYSTEROSCOPY WITH MYOSURE;  Surgeon: Viktoria Comer SAUNDERS, MD;  Location: [facility];  Service: Gynecology;  Laterality: N/A;   DILATION AND CURETTAGE OF UTERUS  12/ 2021  @ gyn office w/ iv sedation   EPIGASTRIC HERNIA REPAIR  12-31-2000 @[facility]    HYSTEROSCOPY WITH D & C  07-10-2012  @[facility]    W/ TRUE CLEAR RESECTION POLYPECTOMY   INGUINAL HERNIA REPAIR Left 2005 approx.   per pt double hernia w/ 6 inch mesh   UMBILICAL HERNIA REPAIR  11-13-2000 @[facility]    Family History  Problem Relation Age of Onset   Hypertension Mother    Fibroids Mother    Lung cancer Mother    Sudden death Neg Hx    Diabetes Neg Hx    Heart attack  Neg Hx    Hyperlipidemia Neg Hx    Breast cancer Neg Hx    Ovarian cancer Neg Hx    Social History   Tobacco Use   Smoking status: Never   Smokeless tobacco: Never  Vaping Use   Vaping status: Never Used  Substance Use Topics   Alcohol use: Yes    Alcohol/week: 0.0 standard drinks of alcohol    Comment: occasional   Drug use: Never   Current Outpatient Medications  Medication Sig Dispense Refill   acidophilus (RISAQUAD) CAPS capsule Take 1 capsule by mouth daily.     calcium -vitamin D (OSCAL WITH D) 500-200 MG-UNIT tablet Take 1 tablet by mouth 1 day or 1 dose.     desonide (DESOWEN) 0.05 % cream   1   HM GARLIC PO Take by mouth daily.     indapamide  (LOZOL ) 1.25 MG tablet Take 2 tablets (2.5 mg total) by mouth daily. 180 tablet 1   Magnesium  125 MG CAPS Take 250 mg by mouth in the morning and at bedtime.      Multiple Vitamin (MULTIVITAMIN) capsule Take 1 capsule by mouth daily.     Omega 3 1200 MG CAPS Take 1,200 mg by mouth.     triamcinolone  cream (KENALOG ) 0.1 % Apply as directed to affected area 2 (two) times daily as needed. 80 g 0   No current facility-administered medications for this visit.   Allergies  Allergen Reactions   Egg-Derived Products Rash    Childhood, per patient request     Review of Systems: All systems reviewed and negative except where noted in HPI.    CT CARDIAC SCORING (SELF PAY ONLY) Result Date: 06/01/2023 CLINICAL DATA:  Risk stratification EXAM: Coronary Calcium  Score TECHNIQUE: The patient was scanned on a Siemens Somatom scanner. Axial non-contrast 3 mm slices were carried out through the heart. The data set was analyzed on a dedicated work station and scored using the Agatson method. FINDINGS: Non-cardiac: See separate report from [facility]. Ascending Aorta: Normal size Pericardium: Normal Coronary arteries: Normal origin of left and right coronary arteries. Distribution of arterial calcifications if present, as noted below; LM 0 LAD 402 LCx 20.9 RCA 238 Total 662 IMPRESSION AND RECOMMENDATION: 1. Coronary calcium  score of 662. This was 98th percentile for age and sex matched control. 2. CAC >300 in LAD, LCx, RCA. CAC-DRS A3/N3. 3. Recommend aspirin and statin if no contraindication. 4. Recommend cardiology consultation. 5. Continue heart healthy lifestyle and risk factor modification. Electronically Signed   By: Redell Cave M.D.   On: 06/01/2023 16:02    Physical Exam: BP (!) 148/84   Pulse 70   Ht 5' 2 (1.575 m)   Wt 147 lb (66.7 kg)   LMP 12/27/2017   BMI 26.89 kg/m  Constitutional: Pleasant,well-developed, female in no acute distress. HEENT: Normocephalic and atraumatic. Conjunctivae are normal. No scleral icterus. Neck supple.  Cardiovascular: Normal rate, regular  rhythm.  Pulmonary/chest: Effort normal and breath sounds normal.  Abdominal: Soft, nondistended, nontender. There are no masses palpable. No hepatomegaly. Extremities: no edema Lymphadenopathy: No cervical adenopathy noted. Neurological: Alert and oriented to person place and time. Skin: Skin is warm and dry. No rashes noted. Psychiatric: Normal mood and affect. Behavior is normal.   ASSESSMENT: 63 y.o. female here for assessment of the following  1. Dysphagia, unspecified type   2. Gastroesophageal reflux disease, unspecified whether esophagitis present   3. History of diverticulitis   4. Colon cancer screening  5. Abnormal screening cardiac CT    Longstanding GERD which is mild and well-controlled with Pepcid as needed, has since developed dysphagia to solids as outlined.  We discussed differential for her dysphagia.  Hopefully this is peptic stricture, but EGD is recommended to rule out other etiologies and potentially dilate if needed.  At the same time she is due for colon cancer screening and to evaluate history of multiple episodes of diverticulitis since her last colonoscopy.  We discussed optical colonoscopy, risks and benefits of the procedure and anesthesia and she understands and wants to proceed.  However, recent cardiac CT noted.  She currently does not have any exertional cardiopulmonary symptoms however, I do recommend she see a cardiologist and have follow-up for this prior to giving her anesthesia for these elective exams.  She is in agreement with this and will pursue evaluation with cardiology, she declined a referral for that today as she knows some of the cardiologist herself.  In the interim, chew food well, avoid large pieces of beef or chicken to prevent impaction.  I offered her a barium swallow to get a sense of what is causing her dysphagia in the interim, she declines that for now but will let me know if she changes her mind.  When she is cleared by cardiology  she will call back to schedule EGD and colonoscopy.  We otherwise discussed diverticulitis and risks for recurrence.  If she has symptoms concerning for this she will contact me for empiric antibiotics if needed.  PLAN: - will get records from [facility] for prior EGD and colonoscopy to clarify findings - she needs EGD and colonoscopy but given abnormal cardiac CT she needs to see cardiology first for further evaluation and clearance.  - offered barium swallow in the interim, she declines - using pepcid PRN for reflux now and well controlled - call if symptoms of recurrent diverticulitis - she will contact us  to schedule EGD and colonoscopy once cleared by cardiology  Shannon Naval, MD [facility]  CC: Shannon Fields, *

## 2023-06-05 NOTE — Patient Instructions (Signed)
 We will request your records from your last EGD and Colonoscopy at [facility].  Please follow up with Cardiology and let us  know when you are cleared to schedule a colonoscopy and EGD with Dr. Leigh.  Thank you for entrusting me with your care and for choosing [facility], Dr. Elspeth Leigh    If your blood pressure at your visit was 140/90 or greater, please contact your primary care physician to follow up on this. ______________________________________________________  If you are age 63 or older, your body mass index should be between 23-30. Your Body mass index is 26.89 kg/m. If this is out of the aforementioned range listed, please consider follow up with your Primary Care Provider.  If you are age 101 or younger, your body mass index should be between 19-25. Your Body mass index is 26.89 kg/m. If this is out of the aformentioned range listed, please consider follow up with your Primary Care Provider.  ________________________________________________________  The [facility] providers would like to encourage you to use MYCHART to communicate with providers for non-urgent requests or questions.  Due to long hold times on the telephone, sending your provider a message by [facility] may be a faster and more efficient way to get a response.  Please allow 48 business hours for a response.  Please remember that this is for non-urgent requests.  _______________________________________________________  Due to recent changes in healthcare laws, you may see the results of your imaging and laboratory studies on MyChart before your provider has had a chance to review them.  We understand that in some cases there may be results that are confusing or concerning to you. Not all laboratory results come back in the same time frame and the provider may be waiting for multiple results in order to interpret others.  Please give us  48 hours in order for your provider to thoroughly review all the  results before contacting the office for clarification of your results.

## 2023-06-11 ENCOUNTER — Telehealth (HOSPITAL_BASED_OUTPATIENT_CLINIC_OR_DEPARTMENT_OTHER): Payer: Self-pay | Admitting: *Deleted

## 2023-06-11 NOTE — Telephone Encounter (Signed)
 Received message from Dr Duke Salvia Dr Riley Kill would like patient to be seen in prevention clinic  Scheduled patient for 1/30 at 10:00 am and left message to call back Work number could not be completed

## 2023-06-13 ENCOUNTER — Telehealth: Payer: Self-pay | Admitting: Gastroenterology

## 2023-06-13 NOTE — Telephone Encounter (Signed)
 PT is calling to update us  that her cardiologist has cleared her for an colonoscopy and would like to know her next steps.

## 2023-06-13 NOTE — Telephone Encounter (Signed)
 Hey Shannon Fields   Pt called back and I gave her appt info and address. She was ok with appt date and time.

## 2023-06-15 NOTE — Telephone Encounter (Signed)
Called [facility] records and requested the EGD/Colonoscopy records again

## 2023-06-15 NOTE — Telephone Encounter (Signed)
Patient has an appointment with Dr. Duke Salvia at [facility] on 1-30.  She wanted to get on the schedule with [facility] for her procedure and not wait to do so until after her 1-30 appointment.  She has been scheduled for ECL on 08-02-23 at 1:00pm and a PV on 2-18 at 10:00am via phone.  Will check for clearance after 1-30

## 2023-06-15 NOTE — Telephone Encounter (Signed)
Called patient and left detailed message with these comments.

## 2023-06-15 NOTE — Telephone Encounter (Signed)
Molli Knock, that is okay to schedule her for March.  I am assuming the cardiologist may want to do some additional testing for her at their visit and will need to await the results and if they have additional testing that is recommended that will need to be completed before we actually proceed with her exams on our end.  Nothing I think we need to do in the interim, I had previously offered her a barium study but she declined at the time.  I do want to get the reports from [facility] of her last EGD and colonoscopy and do not see that they have come through yet although I think we have made the request.  Thanks

## 2023-06-22 ENCOUNTER — Encounter (HOSPITAL_BASED_OUTPATIENT_CLINIC_OR_DEPARTMENT_OTHER): Payer: Self-pay

## 2023-06-27 ENCOUNTER — Ambulatory Visit (HOSPITAL_BASED_OUTPATIENT_CLINIC_OR_DEPARTMENT_OTHER): Payer: Commercial Managed Care - PPO | Admitting: Cardiovascular Disease

## 2023-06-27 ENCOUNTER — Encounter (HOSPITAL_BASED_OUTPATIENT_CLINIC_OR_DEPARTMENT_OTHER): Payer: Self-pay | Admitting: Cardiovascular Disease

## 2023-06-27 ENCOUNTER — Encounter (HOSPITAL_BASED_OUTPATIENT_CLINIC_OR_DEPARTMENT_OTHER): Payer: Self-pay | Admitting: *Deleted

## 2023-06-27 VITALS — BP 146/78 | HR 62 | Ht 62.0 in | Wt 144.2 lb

## 2023-06-27 DIAGNOSIS — I10 Essential (primary) hypertension: Secondary | ICD-10-CM

## 2023-06-27 DIAGNOSIS — I2510 Atherosclerotic heart disease of native coronary artery without angina pectoris: Secondary | ICD-10-CM | POA: Diagnosis not present

## 2023-06-27 HISTORY — DX: Atherosclerotic heart disease of native coronary artery without angina pectoris: I25.10

## 2023-06-27 NOTE — Patient Instructions (Addendum)
Medication Instructions:  START ASPRIN 81 MG DAILY   *If you need a refill on your cardiac medications before your next appointment, please call your pharmacy*  Lab Work: [facility] TODAY   If you have labs (blood work) drawn today and your tests are completely normal, you will receive your results only by: MyChart Message (if you have MyChart) OR A paper copy in the mail If you have any lab test that is abnormal or we need to change your treatment, we will call you to review the results.  Testing/Procedures: NONE  Follow-Up: At [facility], you and your health needs are our priority.  As part of our continuing mission to provide you with exceptional heart care, we have created designated Provider Care Teams.  These Care Teams include your primary Cardiologist (physician) and Advanced Practice Providers (APPs -  Physician Assistants and Nurse Practitioners) who all work together to provide you with the care you need, when you need it.  We recommend signing up for the patient portal called "MyChart".  Sign up information is provided on this After Visit Summary.  MyChart is used to connect with patients for Virtual Visits (Telemedicine).  Patients are able to view lab/test results, encounter notes, upcoming appointments, etc.  Non-urgent messages can be sent to your provider as well.   To learn more about what you can do with MyChart, go to [email or web address].    Your next appointment:   6 month(s)  Provider:   Chilton Si, MD   Other Instructions YOU ARE LOW RISK FOR UPCOMING PROCEDURE, NO FURTHER CARDIAC TESTING REQUIRED

## 2023-06-27 NOTE — Progress Notes (Signed)
Cardiology Office Note:  .   Date:  07/12/2023 ID:  Olive Bass, DOB 01-19-1961, MRN [id number] PCP: McDiarmid, Leighton Roach, MD [facility] Providers Cardiologist:  None      History of Present Illness: .    Shannon Fields is a 63 y.o. female with hypertension, hyperlipidemia, and coronary calcification who is here today for new patient consult for elevated coronary calcium score.  She underwent a calcium score test due to borderline cholesterol levels and age-related concerns, despite being asymptomatic. The results were unexpectedly high, prompting further evaluation. She is transitioning to new healthcare providers due to her previous doctors retiring.  She has a history of hypertension, which she attributes partly to 'white coat syndrome'. She is currently on indapamide to manage her blood pressure, which she monitors weekly. Her blood pressure readings at home range from 120-145/80-95 mmHg. She has no chest pain, shortness of breath, or swelling in her legs or feet.  She experiences infrequent episodes of dysphagia, describing a sensation of food being stuck and causing severe pain. These episodes have occurred approximately four times in the past six months, with the last episode occurring about a month ago. She is concerned about the potential need for a barium swallow test if cardiac issues delay her scheduled endoscopy and colonoscopy.  She engages in regular physical activity, particularly swimming for over an hour five times a week during the summer. However, she is less active in the winter due to leg length discrepancy and Achilles tendon issues. She has recently acquired new shoes to address these issues and is considering orthotics if necessary.  Her family history includes maternal hypertension and strokes in her maternal grandmother. Her mother passed away at 31 from lung cancer. She has no known family history of chronic kidney disease or sudden death.  She does not smoke  and consumes alcohol occasionally, typically one to two glasses of wine or beer on weekends. She follows a diet low in sodium and fat, avoids red meat, and focuses on high protein and calcium-rich foods due to her osteopenia. She takes calcium supplements.     Family history: Her family history includes Fibroids in her mother; Hypertension in her mother; Lung cancer in her mother; Stroke in her maternal grandmother.   ASCVD Risk Score: The 10-year ASCVD risk score (Arnett DK, et al., 2019) is: 6.2%   Values used to calculate the score:     Age: 41 years     Sex: Female     Is Non-Hispanic African American: No     Diabetic: No     Tobacco smoker: No     Systolic Blood Pressure: 148 mmHg     Is BP treated: Yes     HDL Cholesterol: 84 mg/dL     Total Cholesterol: 228 mg/dL    ROS: As per HPI       Studies Reviewed: Marland Kitchen   EKG Interpretation Date/Time:  Wednesday June 27 2023 10:43:40 EST Ventricular Rate:  61 PR Interval:  136 QRS Duration:  88 QT Interval:  442 QTC Calculation: 444 R Axis:   -7  Text Interpretation: Normal sinus rhythm Minimal voltage criteria for LVH, may be normal variant ( R in aVL ) Left anterior fasicular block When compared with ECG of 26-Jan-2016 11:41, Premature ventricular complexes are no longer Present Confirmed by Chilton Si ([id number]) on 06/27/2023 11:08:33 AM     Coronary calcium score 06/01/23: 1. Coronary calcium score of 662. This was 98th percentile for  age and sex matched control.   2. CAC >300 in LAD, LCx, RCA. CAC-DRS A3/N3.   3. Recommend aspirin and statin if no contraindication.   4. Recommend cardiology consultation.   5. Continue heart healthy lifestyle and risk factor modification.  Risk Assessment/Calculations:         Physical Exam:    VS:  BP (!) 146/78   Pulse 62   Ht 5\' 2"  (1.575 m)   Wt 144 lb 3.2 oz (65.4 kg)   LMP 12/27/2017   SpO2 94%   BMI 26.37 kg/m  , BMI Body mass index is 26.37 kg/m. GENERAL:  Well  appearing HEENT: Pupils equal round and reactive, fundi not visualized, oral mucosa unremarkable NECK:  No jugular venous distention, waveform within normal limits, carotid upstroke brisk and symmetric, no bruits, no thyromegaly LUNGS:  Clear to auscultation bilaterally HEART:  RRR.  PMI not displaced or sustained,S1 and S2 within normal limits, no S3, no S4, no clicks, no rubs, no murmurs ABD:  Flat, positive bowel sounds normal in frequency in pitch, no bruits, no rebound, no guarding, no midline pulsatile mass, no hepatomegaly, no splenomegaly EXT:  2 plus pulses throughout, no edema, no cyanosis no clubbing SKIN:  No rashes no nodules NEURO:  Cranial nerves II through XII grossly intact, motor grossly intact throughout PSYCH:  Cognitively intact, oriented to person place and time     ASSESSMENT AND PLAN: .    # Coronary Artery Disease Incidental finding of coronary artery calcification on CT scan. No symptoms of angina or dyspnea on exertion. Discussed the pathophysiology of coronary calcification and the importance of aggressive management to prevent progression. -Start daily aspirin 81 mg daily. - Recommend starting statin therapy, considering rosuvastatin or atorvastatin.  She would like to discuss this recommendation with a friend who is a pharmacist first -Check Lipoprotein(a) level and CRP today. -Recheck cholesterol levels in 2-3 months after initiation of statin therapy if she decides to do so.  # Hypertension Blood pressure readings at home range from 120s-140s/80s-90s. Currently managed with indapamide. -Check blood pressure twice daily for the next couple of weeks. -Consider another 24-hour blood pressure monitor if needed. -BP goal <130/80  # Dysphagia Infrequent episodes of food feeling stuck in the throat with severe pain, last episode about a month ago. No recent episodes. -Cleared for upcoming EGD and colonoscopy.  # General Health Maintenance -Encouraged to find a  winter physical activity and continue with a diet rich in plant-based options. -Plan to follow up in 2-3 months.          Dispo: f/u in 2-3 months in [facility]  Signed, Eligha Bridegroom, NP-C

## 2023-06-28 ENCOUNTER — Telehealth: Payer: Self-pay

## 2023-06-28 ENCOUNTER — Other Ambulatory Visit (HOSPITAL_COMMUNITY): Payer: Self-pay

## 2023-06-28 ENCOUNTER — Ambulatory Visit (HOSPITAL_BASED_OUTPATIENT_CLINIC_OR_DEPARTMENT_OTHER): Payer: Commercial Managed Care - PPO | Admitting: Cardiovascular Disease

## 2023-06-28 NOTE — Telephone Encounter (Addendum)
Called patient and left her a message that she has been cleared by Cardiology. Confirmed procedure and PV

## 2023-06-28 NOTE — Telephone Encounter (Signed)
-----   Message from Benancio Deeds sent at 06/27/2023  3:58 PM EST ----- Lorain Childes - patient cleared to proceed at the [facility] ----- Message ----- From: Burnell Blanks, LPN Sent: [id number]/1191  12:22 PM EST To: Benancio Deeds, MD

## 2023-06-29 ENCOUNTER — Encounter: Payer: Self-pay | Admitting: Gastroenterology

## 2023-06-29 ENCOUNTER — Encounter (HOSPITAL_BASED_OUTPATIENT_CLINIC_OR_DEPARTMENT_OTHER): Payer: Self-pay | Admitting: Cardiovascular Disease

## 2023-06-29 DIAGNOSIS — I10 Essential (primary) hypertension: Secondary | ICD-10-CM

## 2023-06-29 DIAGNOSIS — I2510 Atherosclerotic heart disease of native coronary artery without angina pectoris: Secondary | ICD-10-CM

## 2023-06-29 NOTE — Telephone Encounter (Signed)
 Inbound call from patient returning phone call. Requesting a call back. Please advise, thank you.

## 2023-07-02 NOTE — Telephone Encounter (Signed)
Called and spoke to patient. She wanted to know if there was an earlier appointment than 3-6. Dr. Lanetta Inch next opening is 3-17. Patient is on the waitlist for a double

## 2023-07-05 ENCOUNTER — Ambulatory Visit: Payer: Commercial Managed Care - PPO | Admitting: Pharmacist

## 2023-07-05 ENCOUNTER — Encounter: Payer: Self-pay | Admitting: Pharmacist

## 2023-07-05 VITALS — BP 148/76 | HR 62 | Wt 146.6 lb

## 2023-07-05 DIAGNOSIS — I2510 Atherosclerotic heart disease of native coronary artery without angina pectoris: Secondary | ICD-10-CM

## 2023-07-05 LAB — LIPOPROTEIN A (LPA): Lipoprotein (a): 12.4 nmol/L (ref <75.0)

## 2023-07-05 LAB — C-REACTIVE PROTEIN: CRP: <1 mg/L (ref 0–10)

## 2023-07-05 NOTE — Telephone Encounter (Signed)
 Request for records faxed again to [facility]

## 2023-07-05 NOTE — Assessment & Plan Note (Signed)
 ASCVD risk - elevated - 98th percentile calcium  score. High intensity statin indicated.  Patient educated on purpose, proper use and potential adverse effects of myalgias and other less common side effects.   Following instruction patient verbalized understanding of risk and currently proposed treatment plan.  Highly recommended statin therapy as first step to manage cholesterol / plaque burden.  -Suggested atorvastatin 40mg  daily (alternative rosuvastatin  20mg  daily) - TBD by Cardiology.

## 2023-07-05 NOTE — Progress Notes (Signed)
    S:     Chief Complaint  Patient presents with   Medication Management    Medication Review - statin discussion   63 y.o. female who presents for diabetes evaluation, education, and management. Patient arrives in  good spirits and presents without any assistance.   Patient was referred and last seen by Primary Care Provider, Dr. Jeanelle, on 01/30/2023.   PMH is significant for recent concerning Calcium  Score for CAD risk evaluation which was found to be 98 percentile  Current hyperlipidemia medications include: None.  Majority of visit spent discussion drug therapy options for her cardiovascular risk.  Including discussion of benefits/risk and side effects of therapeutic options.    O:   Review of Systems  All other systems reviewed and are negative.   Physical Exam Vitals reviewed.  Constitutional:      Appearance: Normal appearance.  Pulmonary:     Effort: Pulmonary effort is normal.  Neurological:     Mental Status: She is alert.  Psychiatric:        Mood and Affect: Mood normal.        Behavior: Behavior normal.        Thought Content: Thought content normal.       Vitals:   07/05/23 1603  BP: (!) 148/76  Pulse: 62  SpO2: 100%    Lipid Panel     Component Value Date/Time   CHOL 228 (H) 01/30/2023 1744   TRIG 83 01/30/2023 1744   HDL 84 01/30/2023 1744   CHOLHDL 2.7 01/30/2023 1744   CHOLHDL 3.0 11/11/2013 0853   VLDL 20 11/11/2013 0853   LDLCALC 130 (H) 01/30/2023 1744     A/P: Completed medication review - updated all medications including dietary supplements.   ASCVD risk - elevated - 98th percentile calcium  score. High intensity statin indicated.  Patient educated on purpose, proper use and potential adverse effects of myalgias and other less common side effects.   Following instruction patient verbalized understanding of risk and currently proposed treatment plan.  Highly recommended statin therapy as first step to manage cholesterol /  plaque burden.  -Suggested atorvastatin 40mg  daily (alternative rosuvastatin  20mg  daily) - TBD by Cardiology.   Written patient instructions provided. Patient verbalized understanding of treatment plan.  Total time in face to face counseling 30 minutes.    Follow-up:  Pharmacist PRN PCP clinic visit in TBD Patient seen with Owens Cowing, PharmD Candidate and Doyal Goods, PharmD Candidate.

## 2023-07-05 NOTE — Patient Instructions (Addendum)
 It was nice to see you today!  Your goal blood pressure is <130/80  mmHg.  Medication Changes:  Continue all other medication the same.    Keep up the good work with diet and exercise. Aim for a diet full of vegetables, fruit and lean meats (chicken, turkey, fish). Try to limit salt intake by eating fresh or frozen vegetables (instead of canned), rinse canned vegetables prior to cooking and do not add any additional salt to meals.

## 2023-07-09 NOTE — Progress Notes (Signed)
 Reviewed and agree with Dr Macky Lower plan.

## 2023-07-12 ENCOUNTER — Encounter (HOSPITAL_BASED_OUTPATIENT_CLINIC_OR_DEPARTMENT_OTHER): Payer: Self-pay | Admitting: Cardiovascular Disease

## 2023-07-17 ENCOUNTER — Ambulatory Visit (AMBULATORY_SURGERY_CENTER): Payer: Commercial Managed Care - PPO | Admitting: *Deleted

## 2023-07-17 ENCOUNTER — Other Ambulatory Visit (HOSPITAL_COMMUNITY): Payer: Self-pay

## 2023-07-17 VITALS — Ht 62.0 in | Wt 145.0 lb

## 2023-07-17 DIAGNOSIS — Z1211 Encounter for screening for malignant neoplasm of colon: Secondary | ICD-10-CM

## 2023-07-17 DIAGNOSIS — K2100 Gastro-esophageal reflux disease with esophagitis, without bleeding: Secondary | ICD-10-CM

## 2023-07-17 DIAGNOSIS — R1310 Dysphagia, unspecified: Secondary | ICD-10-CM

## 2023-07-17 MED ORDER — SUFLAVE 178.7 G PO SOLR
1.0000 | Freq: Once | ORAL | 0 refills | Status: AC
  Filled 2023-07-17: qty 2, 1d supply, fill #0

## 2023-07-17 NOTE — Progress Notes (Signed)
Pt's name and DOB verified at the beginning of the pre-visit wit 2 identifiers  Permission given to speak with  Pt denies any difficulty with ambulating,sitting, laying down or rolling side to side  Pt has no issues with ambulation   Pt has no issues moving head neck or swallowing  No egg or soy allergy known to patient   No issues known to pt with past sedation with any surgeries or procedures  Patient denies ever being intubated  No FH of Malignant Hyperthermia  Pt is not on diet pills or shots  Pt is not on home 02   Pt is not on blood thinners   Pt denies issues with constipation   Pt is not on dialysis  Pt denise any abnormal heart rhythms   Pt denies any upcoming cardiac testing  Patient's chart reviewed by Cathlyn Parsons CNRA prior to pre-visit and patient appropriate for the [facility].  Pre-visit completed and red dot placed by patient's name on their procedure day (on provider's schedule).    Chart not reviewed by CRNA prior to [facility]  Visit by phone  Pt states weight is 145 lb  IInstructions reviewed. Pt given [facility], [facility] main # and MD on call # prior to instructions.  Pt states understanding of instructions. Instructed to review again prior to procedure. Pt states they will.   Informed pt that they will receive a text or  call from [facility] regarding there prep med.

## 2023-07-18 ENCOUNTER — Other Ambulatory Visit (HOSPITAL_COMMUNITY): Payer: Self-pay

## 2023-07-18 ENCOUNTER — Encounter (HOSPITAL_BASED_OUTPATIENT_CLINIC_OR_DEPARTMENT_OTHER): Payer: Self-pay | Admitting: Cardiovascular Disease

## 2023-07-18 DIAGNOSIS — Z5181 Encounter for therapeutic drug level monitoring: Secondary | ICD-10-CM

## 2023-07-18 DIAGNOSIS — I10 Essential (primary) hypertension: Secondary | ICD-10-CM

## 2023-07-18 DIAGNOSIS — I2510 Atherosclerotic heart disease of native coronary artery without angina pectoris: Secondary | ICD-10-CM

## 2023-07-19 NOTE — Telephone Encounter (Signed)
Called and left another message for [facility] that we are awaiting her procedure records.

## 2023-07-19 NOTE — Telephone Encounter (Signed)
Last office note mentions 2 cholesterol management possibilities, please advise for which and dose!

## 2023-07-20 ENCOUNTER — Encounter: Payer: Self-pay | Admitting: Gastroenterology

## 2023-07-23 NOTE — Telephone Encounter (Signed)
 Records received and placed on Dr. Lanetta Inch desk for his review

## 2023-07-24 ENCOUNTER — Telehealth: Payer: Self-pay | Admitting: Gastroenterology

## 2023-07-24 DIAGNOSIS — T1512XA Foreign body in conjunctival sac, left eye, initial encounter: Secondary | ICD-10-CM | POA: Diagnosis not present

## 2023-07-24 NOTE — Telephone Encounter (Signed)
 Records arrived from prior workup as outlined:  EGD 05/07/2012 - Dr. Madilyn Fireman [facility] - LA grade A esophagitis, antral erosions, gastritis- biopsies negative for H pylori  Colonoscopy 03/07/2012 - Dr Madilyn Fireman [facility] - 5mm transverse colon polyp, 6mm transverse colon polyp - path showed benign colonic mucosa   Await currently scheduled EGD and colonoscopy per office note.

## 2023-07-25 MED ORDER — ROSUVASTATIN CALCIUM 20 MG PO TABS
20.0000 mg | ORAL_TABLET | Freq: Every day | ORAL | 3 refills | Status: AC
  Filled 2023-07-25: qty 90, 90d supply, fill #0

## 2023-07-26 ENCOUNTER — Other Ambulatory Visit (HOSPITAL_COMMUNITY): Payer: Self-pay

## 2023-07-26 DIAGNOSIS — L821 Other seborrheic keratosis: Secondary | ICD-10-CM | POA: Diagnosis not present

## 2023-07-26 DIAGNOSIS — D2261 Melanocytic nevi of right upper limb, including shoulder: Secondary | ICD-10-CM | POA: Diagnosis not present

## 2023-07-26 DIAGNOSIS — D2271 Melanocytic nevi of right lower limb, including hip: Secondary | ICD-10-CM | POA: Diagnosis not present

## 2023-07-26 DIAGNOSIS — D2272 Melanocytic nevi of left lower limb, including hip: Secondary | ICD-10-CM | POA: Diagnosis not present

## 2023-07-26 DIAGNOSIS — L814 Other melanin hyperpigmentation: Secondary | ICD-10-CM | POA: Diagnosis not present

## 2023-07-26 DIAGNOSIS — L218 Other seborrheic dermatitis: Secondary | ICD-10-CM | POA: Diagnosis not present

## 2023-07-26 DIAGNOSIS — L570 Actinic keratosis: Secondary | ICD-10-CM | POA: Diagnosis not present

## 2023-07-26 DIAGNOSIS — D1801 Hemangioma of skin and subcutaneous tissue: Secondary | ICD-10-CM | POA: Diagnosis not present

## 2023-07-31 ENCOUNTER — Encounter: Payer: Self-pay | Admitting: Gastroenterology

## 2023-08-02 ENCOUNTER — Encounter: Payer: Self-pay | Admitting: Gastroenterology

## 2023-08-02 ENCOUNTER — Ambulatory Visit (AMBULATORY_SURGERY_CENTER): Payer: Commercial Managed Care - PPO | Admitting: Gastroenterology

## 2023-08-02 ENCOUNTER — Other Ambulatory Visit (HOSPITAL_COMMUNITY): Payer: Self-pay

## 2023-08-02 VITALS — BP 138/70 | HR 60 | Temp 98.8°F | Resp 12 | Ht 62.0 in | Wt 145.0 lb

## 2023-08-02 DIAGNOSIS — D125 Benign neoplasm of sigmoid colon: Secondary | ICD-10-CM

## 2023-08-02 DIAGNOSIS — K6289 Other specified diseases of anus and rectum: Secondary | ICD-10-CM | POA: Diagnosis not present

## 2023-08-02 DIAGNOSIS — K648 Other hemorrhoids: Secondary | ICD-10-CM

## 2023-08-02 DIAGNOSIS — D123 Benign neoplasm of transverse colon: Secondary | ICD-10-CM | POA: Diagnosis not present

## 2023-08-02 DIAGNOSIS — Z1211 Encounter for screening for malignant neoplasm of colon: Secondary | ICD-10-CM

## 2023-08-02 DIAGNOSIS — Q439 Congenital malformation of intestine, unspecified: Secondary | ICD-10-CM

## 2023-08-02 DIAGNOSIS — K2970 Gastritis, unspecified, without bleeding: Secondary | ICD-10-CM

## 2023-08-02 DIAGNOSIS — K2100 Gastro-esophageal reflux disease with esophagitis, without bleeding: Secondary | ICD-10-CM

## 2023-08-02 DIAGNOSIS — K5730 Diverticulosis of large intestine without perforation or abscess without bleeding: Secondary | ICD-10-CM

## 2023-08-02 DIAGNOSIS — K222 Esophageal obstruction: Secondary | ICD-10-CM

## 2023-08-02 DIAGNOSIS — K3189 Other diseases of stomach and duodenum: Secondary | ICD-10-CM | POA: Diagnosis not present

## 2023-08-02 DIAGNOSIS — R1310 Dysphagia, unspecified: Secondary | ICD-10-CM | POA: Diagnosis not present

## 2023-08-02 DIAGNOSIS — Z8719 Personal history of other diseases of the digestive system: Secondary | ICD-10-CM | POA: Diagnosis not present

## 2023-08-02 HISTORY — DX: Personal history of other diseases of the digestive system: Z87.19

## 2023-08-02 HISTORY — DX: Esophageal obstruction: K22.2

## 2023-08-02 MED ORDER — SODIUM CHLORIDE 0.9 % IV SOLN: 500.0000 mL | INTRAVENOUS | Status: AC

## 2023-08-02 MED ORDER — OMEPRAZOLE 20 MG PO CPDR
20.0000 mg | DELAYED_RELEASE_CAPSULE | Freq: Every day | ORAL | 3 refills | Status: AC
  Filled 2023-08-02: qty 30, 30d supply, fill #0
  Filled 2024-04-28: qty 30, 30d supply, fill #1

## 2023-08-02 NOTE — Progress Notes (Signed)
 Sedate, gd SR, tolerated procedure well, VSS, report to RN

## 2023-08-02 NOTE — Progress Notes (Signed)
 Called to room to assist during endoscopic procedure.  Patient ID and intended procedure confirmed with present staff. Received instructions for my participation in the procedure from the performing physician.

## 2023-08-02 NOTE — Op Note (Signed)
 [facility] Patient Name: Shannon Fields Procedure Date: 08/02/2023 2:07 PM MRN: [id number] Endoscopist: Viviann Spare P. Adela Lank , MD, [id number] Age: 63 Referring MD:  Date of Birth: 12-Oct-1960 Gender: Female Account #: [id number] Procedure:                Colonoscopy Indications:              Screening for colorectal malignant neoplasm - last                            exam 2013, also with history of left sided                            diverticulitis Medicines:                Monitored Anesthesia Care Procedure:                Pre-Anesthesia Assessment:                           - Prior to the procedure, a History and Physical                            was performed, and patient medications and                            allergies were reviewed. The patient's tolerance of                            previous anesthesia was also reviewed. The risks                            and benefits of the procedure and the sedation                            options and risks were discussed with the patient.                            All questions were answered, and informed consent                            was obtained. Prior Anticoagulants: The patient has                            taken no anticoagulant or antiplatelet agents. ASA                            Grade Assessment: II - A patient with mild systemic                            disease. After reviewing the risks and benefits,                            the patient was deemed in satisfactory condition to  undergo the procedure.                           After obtaining informed consent, the colonoscope                            was passed under direct vision. Throughout the                            procedure, the patient's blood pressure, pulse, and                            oxygen saturations were monitored continuously. The                            Olympus Scope SN [id number] was introduced  through the                            anus and advanced to the the cecum, identified by                            appendiceal orifice and ileocecal valve. The                            colonoscopy was technically difficult and complex                            due to a tortuous colon. The patient tolerated the                            procedure well. The quality of the bowel                            preparation was good. The ileocecal valve,                            appendiceal orifice, and rectum were photographed. Scope In: 2:33:37 PM Scope Out: 2:52:16 PM Scope Withdrawal Time: 0 hours 11 minutes 50 seconds  Total Procedure Duration: 0 hours 18 minutes 39 seconds  Findings:                 Skin tags were found on perianal exam.                           A 3 mm polyp was found in the splenic flexure. The                            polyp was sessile. The polyp was removed with a                            cold snare. Resection and retrieval were complete.                           A 3 mm polyp was  found in the sigmoid colon. The                            polyp was sessile. The polyp was removed with a                            cold snare. Resection and retrieval were complete.                           Multiple small-mouthed diverticula were found in                            the left colon.                           The colon was tortuous.                           Anal papilla(e) was hypertrophied. Biopsies were                            taken with a cold forceps for histology to rule out                            AIN.                           Internal hemorrhoids were found during retroflexion.                           The exam was otherwise without abnormality. Complications:            No immediate complications. Estimated blood loss:                            Minimal. Estimated Blood Loss:     Estimated blood loss was minimal. Impression:               -  Perianal skin tags found on perianal exam.                           - One 3 mm polyp at the splenic flexure, removed                            with a cold snare. Resected and retrieved.                           - One 3 mm polyp in the sigmoid colon, removed with                            a cold snare. Resected and retrieved.                           - Diverticulosis in the left colon.                           -  Tortuous colon.                           - Anal papilla(e) was hypertrophied. Biopsied to                            rule out AIN.                           - Internal hemorrhoids.                           - The examination was otherwise normal. Recommendation:           - Patient has a contact number available for                            emergencies. The signs and symptoms of potential                            delayed complications were discussed with the                            patient. Return to normal activities tomorrow.                            Written discharge instructions were provided to the                            patient.                           - Resume previous diet.                           - Continue present medications.                           - Await pathology results. Viviann Spare P. Ernest Orr, MD 08/02/2023 2:57:18 PM This report has been signed electronically.

## 2023-08-02 NOTE — Patient Instructions (Addendum)
 Resume previous diet- post dilation diet--clear liquid for 2 - 3 hours then soft diet -see handout Continue present medications, begin Omeprazole 20 mg daily for the next 4 weeks then as needed for reflux Await pathology results  Handouts/information given for esophageal stricture, Gastritis, esophagitis, hiatal hernia, polyps, diverticulosis and hemorrhoids  YOU HAD AN ENDOSCOPIC PROCEDURE TODAY AT THE [facility]:   Refer to the procedure report that was given to you for any specific questions about what was found during the examination.  If the procedure report does not answer your questions, please call your gastroenterologist to clarify.  If you requested that your care partner not be given the details of your procedure findings, then the procedure report has been included in a sealed envelope for you to review at your convenience later.  YOU SHOULD EXPECT: Some feelings of bloating in the abdomen. Passage of more gas than usual.  Walking can help get rid of the air that was put into your GI tract during the procedure and reduce the bloating. If you had a lower endoscopy (such as a colonoscopy or flexible sigmoidoscopy) you may notice spotting of blood in your stool or on the toilet paper. If you underwent a bowel prep for your procedure, you may not have a normal bowel movement for a few days.  Please Note:  You might notice some irritation and congestion in your nose or some drainage.  This is from the oxygen used during your procedure.  There is no need for concern and it should clear up in a day or so.  SYMPTOMS TO REPORT IMMEDIATELY:  Following lower endoscopy (colonoscopy):  Excessive amounts of blood in the stool  Significant tenderness or worsening of abdominal pains  Swelling of the abdomen that is new, acute  Fever of 100F or higher Following upper endoscopy (EGD)  Vomiting of blood or coffee ground material  New chest pain or pain under the shoulder blades  Painful  or persistently difficult swallowing  New shortness of breath  Black, tarry-looking stools For urgent or emergent issues, a gastroenterologist can be reached at any hour by calling [phone]. Do not use MyChart messaging for urgent concerns.   DIET:  see post dilation diet handout, then TOMORROW- you may proceed to your regular diet.  Drink plenty of fluids but you should avoid alcoholic beverages for 24 hours.  ACTIVITY:  You should plan to take it easy for the rest of today and you should NOT DRIVE or use heavy machinery until tomorrow (because of the sedation medicines used during the test).    FOLLOW UP: Our staff will call the number listed on your records the next business day following your procedure.  We will call around 7:15- 8:00 am to check on you and address any questions or concerns that you may have regarding the information given to you following your procedure. If we do not reach you, we will leave a message.     If any biopsies were taken you will be contacted by phone or by letter within the next 1-3 weeks.  Please call us at [phone] if you have not heard about the biopsies in 3 weeks.   SIGNATURES/CONFIDENTIALITY: You and/or your care partner have signed paperwork which will be entered into your electronic medical record.  These signatures attest to the fact that that the information above on your After Visit Summary has been reviewed and is understood.  Full responsibility of the confidentiality of this discharge information  lies with you and/or your care-partner.

## 2023-08-02 NOTE — Op Note (Signed)
 [facility] Patient Name: Shannon Fields Procedure Date: 08/02/2023 2:15 PM MRN: [id number] Endoscopist: Viviann Spare P. Adela Lank , MD, [id number] Age: 63 Referring MD:  Date of Birth: Apr 04, 1961 Gender: Female Account #: [id number] Procedure:                Upper GI endoscopy Indications:              Dysphagia, Follow-up of gastro-esophageal reflux                            disease Medicines:                Monitored Anesthesia Care Procedure:                Pre-Anesthesia Assessment:                           - Prior to the procedure, a History and Physical                            was performed, and patient medications and                            allergies were reviewed. The patient's tolerance of                            previous anesthesia was also reviewed. The risks                            and benefits of the procedure and the sedation                            options and risks were discussed with the patient.                            All questions were answered, and informed consent                            was obtained. Prior Anticoagulants: The patient has                            taken no anticoagulant or antiplatelet agents. ASA                            Grade Assessment: II - A patient with mild systemic                            disease. After reviewing the risks and benefits,                            the patient was deemed in satisfactory condition to                            undergo the procedure.  After obtaining informed consent, the endoscope was                            passed under direct vision. Throughout the                            procedure, the patient's blood pressure, pulse, and                            oxygen saturations were monitored continuously. The                            Olympus Scope [id number] was introduced through the                            mouth, and advanced to the second part  of duodenum.                            The upper GI endoscopy was accomplished without                            difficulty. The patient tolerated the procedure                            well. Scope In: Scope Out: Findings:                 Esophagogastric landmarks were identified: the                            Z-line was found at 35 cm, the gastroesophageal                            junction was found at 35 cm and the upper extent of                            the gastric folds was found at 36 cm from the                            incisors.                           A 1 cm hiatal hernia was present.                           LA Grade A (one or more mucosal breaks less than 5                            mm, not extending between tops of 2 mucosal folds)                            esophagitis was found.                           One  benign-appearing, intrinsic stenosis was found                            35 cm from the incisors. This stenosis measured                            less than one cm (in length). A TTS dilator was                            passed through the scope. Dilation with a                            15-16.5-18 mm balloon dilator was performed to 15                            mm, 16.5 mm and 18 mm, after which appropriate                            dilation effect noted. Biopsy forceps then used to                            open the stricture further.                           The exam of the esophagus was otherwise normal.                           Patchy mild inflammation characterized by erythema                            and granularity was found in the gastric antrum.                           The exam of the stomach was otherwise normal.                           Biopsies were taken with a cold forceps for                            Helicobacter pylori testing.                           Patchy mildly erythematous mucosa was found in the                             duodenal bulb.                           The exam of the duodenum was otherwise normal. Complications:            No immediate complications. Estimated blood loss:                            Minimal. Estimated Blood Loss:     Estimated blood  loss was minimal. Impression:               - Esophagogastric landmarks identified.                           - 1 cm hiatal hernia.                           - LA Grade A reflux esophagitis.                           - Benign-appearing esophageal stenosis. Dilated to                            18mm with good result.                           - Gastritis.                           - Erythematous duodenopathy.                           - Biopsies were taken with a cold forceps for                            Helicobacter pylori testing. Recommendation:           - Patient has a contact number available for                            emergencies. The signs and symptoms of potential                            delayed complications were discussed with the                            patient. Return to normal activities tomorrow.                            Written discharge instructions were provided to the                            patient.                           - Post dilation diet as tolerated.                           - Continue present medications.                           - Start omeprazole 20mg  / day for the next 4 weeks                            and then use as needed for reflux                           -  Await pathology results. Viviann Spare P. Johnattan Strassman, MD 08/02/2023 3:00:21 PM This report has been signed electronically.

## 2023-08-02 NOTE — Progress Notes (Signed)
 Pt's states no medical or surgical changes since previsit or office visit.

## 2023-08-02 NOTE — Progress Notes (Signed)
 [facility] History and Physical   Primary Care Physician:  McDiarmid, Leighton Roach, MD   Reason for Procedure:   GERD, dysphagia, history of diverticulitis, screening  Plan:    EGD with possible dilation, colonoscopy     HPI: Shannon Fields is a 63 y.o. female  here for EGD and colonoscopy - longstanding GERD, on pepcid, history of dysphagia. EGD to further evaluate and perform dilation if needed. Last colonoscopy 2013 with Dr. Madilyn Fireman - 2 benign polyps removed. She has since had diverticulitis a few times. Colonoscopy to evaluate this and perform her screening.    Patient denies any bowel symptoms at this time. No family history of colon cancer known. Otherwise feels well without any cardiopulmonary symptoms.   I have discussed risks / benefits of anesthesia and endoscopic procedure with Olive Bass and they wish to proceed with the exams as outlined today.    Past Medical History:  Diagnosis Date   Coronary artery calcification 06/27/2023   Diverticulosis    GERD (gastroesophageal reflux disease)    History of diverticulitis 06/2019   History of gastroesophageal reflux (GERD)    Osteopenia    Osteoporosis    PMB (postmenopausal bleeding)    Uterine fibroid    Uterine polyp 07/10/2012   Wears glasses    White coat syndrome without diagnosis of hypertension     Past Surgical History:  Procedure Laterality Date   ABDOMINOPLASTY  12-02-2001  @[facility]    AND VENTRAL HERNIA REPAIR   CESAREAN SECTION  x2  last one 07-05-2020 @ [facility]   AND BILATERAL TUBAL LIGATION W/ LAST C/S   DILATATION & CURETTAGE/HYSTEROSCOPY WITH MYOSURE N/A 06/10/2020   Procedure: DILATATION & CURETTAGE/HYSTEROSCOPY WITH MYOSURE;  Surgeon: Carver Fila, MD;  Location: [facility];  Service: Gynecology;  Laterality: N/A;   DILATION AND CURETTAGE OF UTERUS  12/ 2021  @ gyn office w/ iv sedation   EPIGASTRIC HERNIA REPAIR  12-31-2000 @[facility]    HYSTEROSCOPY WITH D & C  07-10-2012  @[facility]    W/  TRUE CLEAR RESECTION POLYPECTOMY   INGUINAL HERNIA REPAIR Left 2005 approx.   per pt double hernia w/ 6 inch mesh   UMBILICAL HERNIA REPAIR  11-13-2000 @[facility]     Prior to Admission medications   Medication Sig Start Date End Date Taking? Authorizing Provider  acidophilus (RISAQUAD) CAPS capsule Take 1 capsule by mouth daily.   Yes [provider]  aspirin EC 81 MG tablet Take 81 mg by mouth daily. Swallow whole.   Yes [provider]  calcium-vitamin D (OSCAL WITH D) 500-200 MG-UNIT tablet Take 1 tablet by mouth 1 day or 1 dose.   Yes [provider]  HM GARLIC PO Take by mouth daily.   Yes [provider]  indapamide (LOZOL) 1.25 MG tablet Take 2 tablets (2.5 mg total) by mouth daily. 03/20/23  Yes Carney Living, MD  Magnesium 125 MG CAPS Take 250 mg by mouth in the morning and at bedtime. TAKES 2 TABLETS IN THE MORNING   Yes [provider]  Omega 3 1200 MG CAPS Take 1,200 mg by mouth. Takes every day per pt   Yes [provider]  TAURINE PO Take 1,000 mg by mouth daily at 6 (six) AM.   Yes [provider]  desonide (DESOWEN) 0.05 % cream  12/24/15   [provider]  Multiple Vitamin (MULTIVITAMIN) capsule Take 1 capsule by mouth daily.    [provider]  rosuvastatin (CRESTOR)  20 MG tablet Take 1 tablet (20 mg total) by mouth daily. Patient not taking: Reported on 08/02/2023 07/25/23 10/24/23  Chilton Si, MD    Current Outpatient Medications  Medication Sig Dispense Refill   acidophilus (RISAQUAD) CAPS capsule Take 1 capsule by mouth daily.     aspirin EC 81 MG tablet Take 81 mg by mouth daily. Swallow whole.     calcium-vitamin D (OSCAL WITH D) 500-200 MG-UNIT tablet Take 1 tablet by mouth 1 day or 1 dose.     HM GARLIC PO Take by mouth daily.     indapamide (LOZOL) 1.25 MG tablet Take 2 tablets (2.5 mg total) by mouth daily. 180 tablet 1   Magnesium 125 MG CAPS Take 250 mg by mouth in the  morning and at bedtime. TAKES 2 TABLETS IN THE MORNING     Omega 3 1200 MG CAPS Take 1,200 mg by mouth. Takes every day per pt     TAURINE PO Take 1,000 mg by mouth daily at 6 (six) AM.     desonide (DESOWEN) 0.05 % cream  (Patient not taking: Reported on 08/02/2023)  1   Multiple Vitamin (MULTIVITAMIN) capsule Take 1 capsule by mouth daily.     rosuvastatin (CRESTOR) 20 MG tablet Take 1 tablet (20 mg total) by mouth daily. (Patient not taking: Reported on 08/02/2023) 90 tablet 3   Current Facility-Administered Medications  Medication Dose Route Frequency Provider Last Rate Last Admin   0.9 %  sodium chloride infusion  500 mL Intravenous Continuous Louise Rawson, Willaim Rayas, MD        Allergies as of 08/02/2023 - Review Complete 08/02/2023  Allergen Reaction Noted   Egg-derived products Rash 03/25/2012    Family History  Problem Relation Age of Onset   Hypertension Mother    Fibroids Mother    Lung cancer Mother    Stroke Maternal Grandmother    Sudden death Neg Hx    Diabetes Neg Hx    Heart attack Neg Hx    Hyperlipidemia Neg Hx    Breast cancer Neg Hx    Ovarian cancer Neg Hx    Colon cancer Neg Hx    Colon polyps Neg Hx    Esophageal cancer Neg Hx    Stomach cancer Neg Hx    Rectal cancer Neg Hx     Social History   Socioeconomic History   Marital status: Married    Spouse name: Not on file   Number of children: Not on file   Years of education: Not on file   Highest education level: Not on file  Occupational History   Occupation: works at [employer]  Tobacco Use   Smoking status: Never   Smokeless tobacco: Never  Vaping Use   Vaping status: Never Used  Substance and Sexual Activity   Alcohol use: Yes    Alcohol/week: 0.0 standard drinks of alcohol    Comment: occasional   Drug use: Never   Sexual activity: Not on file  Other Topics Concern   Not on file  Social History Narrative   Not on file   Social Drivers of Health   Financial  Resource Strain: Not on file  Food Insecurity: Not on file  Transportation Needs: Not on file  Physical Activity: Not on file  Stress: Not on file  Social Connections: Not on file  Intimate Partner Violence: Not on file    Review of Systems: All other review of systems negative except  as mentioned in the HPI.  Physical Exam: Vital signs BP (!) 155/91   Pulse 71   Temp 98.8 F (37.1 C) (Temporal)   Ht 5\' 2"  (1.575 m)   Wt 145 lb (65.8 kg)   LMP 12/27/2017   SpO2 98%   BMI 26.52 kg/m   General:   Alert,  Well-developed, pleasant and cooperative in NAD Lungs:  Clear throughout to auscultation.   Heart:  Regular rate and rhythm Abdomen:  Soft, nontender and nondistended.   Neuro/Psych:  Alert and cooperative. Normal mood and affect. A and O x 3  Harlin Rain, MD [facility]

## 2023-08-03 ENCOUNTER — Telehealth: Payer: Self-pay

## 2023-08-03 NOTE — Telephone Encounter (Signed)
  Follow up Call-     08/02/2023    1:26 PM  Call back number  Post procedure Call Back phone  # [phone]  Permission to leave phone message Yes     Left Message

## 2023-08-06 ENCOUNTER — Encounter: Payer: Self-pay | Admitting: Family Medicine

## 2023-08-06 DIAGNOSIS — K2970 Gastritis, unspecified, without bleeding: Secondary | ICD-10-CM

## 2023-08-06 DIAGNOSIS — K209 Esophagitis, unspecified without bleeding: Secondary | ICD-10-CM | POA: Insufficient documentation

## 2023-08-06 DIAGNOSIS — M1990 Unspecified osteoarthritis, unspecified site: Secondary | ICD-10-CM | POA: Insufficient documentation

## 2023-08-06 HISTORY — DX: Gastritis, unspecified, without bleeding: K29.70

## 2023-08-06 HISTORY — DX: Esophagitis, unspecified without bleeding: K20.90

## 2023-08-07 LAB — SURGICAL PATHOLOGY

## 2023-08-09 ENCOUNTER — Encounter: Payer: Self-pay | Admitting: Gastroenterology

## 2023-08-10 ENCOUNTER — Encounter: Payer: Self-pay | Admitting: Gastroenterology

## 2023-08-13 ENCOUNTER — Other Ambulatory Visit (HOSPITAL_COMMUNITY): Payer: Self-pay

## 2023-08-14 ENCOUNTER — Other Ambulatory Visit (HOSPITAL_COMMUNITY): Payer: Self-pay

## 2023-08-14 MED ORDER — TRIAMCINOLONE ACETONIDE 0.1 % EX CREA
TOPICAL_CREAM | Freq: Two times a day (BID) | CUTANEOUS | 0 refills | Status: AC | PRN
  Filled 2023-08-14: qty 80, 30d supply, fill #0

## 2023-09-25 ENCOUNTER — Other Ambulatory Visit: Payer: Self-pay | Admitting: Family Medicine

## 2023-09-25 ENCOUNTER — Encounter: Payer: Self-pay | Admitting: Family Medicine

## 2023-09-25 DIAGNOSIS — I10 Essential (primary) hypertension: Secondary | ICD-10-CM

## 2023-09-26 ENCOUNTER — Telehealth: Payer: Self-pay

## 2023-09-26 ENCOUNTER — Other Ambulatory Visit (HOSPITAL_COMMUNITY): Payer: Self-pay

## 2023-09-26 DIAGNOSIS — I10 Essential (primary) hypertension: Secondary | ICD-10-CM

## 2023-09-26 MED ORDER — INDAPAMIDE 1.25 MG PO TABS
2.5000 mg | ORAL_TABLET | Freq: Every day | ORAL | 3 refills | Status: DC
  Filled 2023-09-26 (×2): qty 180, 90d supply, fill #0

## 2023-09-26 NOTE — Telephone Encounter (Signed)
 Received call from [facility] regarding Indapamide  prescription.   Pharmacist reports that patient is requesting dosage change from 1.25 mg tablet to 2.5 mg tablet, so that she only has to take 1 tablet daily.  Requesting that prescription be resent to [facility] pharmacy.   Elsie Halo, RN

## 2023-09-27 ENCOUNTER — Other Ambulatory Visit (HOSPITAL_COMMUNITY): Payer: Self-pay

## 2023-09-27 ENCOUNTER — Other Ambulatory Visit: Payer: Self-pay

## 2023-09-27 MED ORDER — INDAPAMIDE 2.5 MG PO TABS
2.5000 mg | ORAL_TABLET | Freq: Every day | ORAL | 3 refills | Status: AC
  Filled 2023-09-27: qty 90, 90d supply, fill #0
  Filled 2023-12-17: qty 90, 90d supply, fill #1
  Filled 2024-03-13: qty 90, 90d supply, fill #2
  Filled 2024-06-06: qty 90, 90d supply, fill #3
  Filled 2024-06-06: qty 90, 90d supply, fill #0

## 2023-09-27 NOTE — Telephone Encounter (Signed)
 Idapamide changed to 2.5 mg tab dose.  Sent to [facility]

## 2023-10-30 ENCOUNTER — Encounter: Payer: Self-pay | Admitting: Sports Medicine

## 2023-10-30 ENCOUNTER — Ambulatory Visit: Admitting: Sports Medicine

## 2023-10-30 ENCOUNTER — Ambulatory Visit (HOSPITAL_BASED_OUTPATIENT_CLINIC_OR_DEPARTMENT_OTHER)
Admission: RE | Admit: 2023-10-30 | Discharge: 2023-10-30 | Disposition: A | Discharge status: Home / Self Care | Source: Ambulatory Visit | Attending: Sports Medicine | Admitting: Sports Medicine

## 2023-10-30 VITALS — BP 151/92 | Ht 62.0 in | Wt 144.0 lb

## 2023-10-30 DIAGNOSIS — S99922A Unspecified injury of left foot, initial encounter: Secondary | ICD-10-CM

## 2023-10-30 DIAGNOSIS — M19072 Primary osteoarthritis, left ankle and foot: Secondary | ICD-10-CM | POA: Diagnosis not present

## 2023-10-30 HISTORY — DX: Primary osteoarthritis, left ankle and foot: M19.072

## 2023-10-30 NOTE — Progress Notes (Signed)
   Subjective:    Patient ID: Shannon Fields, female    DOB: January 30, 1961, 63 y.o.   MRN: [id number]  HPI chief complaint: Possible left foot foreign body  Shannon Fields presents today with concerns about a possible foreign body in her left foot.  A couple of weeks ago while visiting a local park a piece of bark stuck her in the medial aspect of her left foot.  She initially had some erythema but that has since resolved.  She feels a palpable bump in the area of the injury.  It is not painful.    Review of Systems As above    Objective:   Physical Exam  Well-developed, well-nourished.  No acute distress  Left foot: There is a palpable, freely movable area in the medial aspect of the foot near the insertion of the anterior tibialis tendon.  It is nontender.  Nonerythematous.  There is a small healed puncture wound at this same level.  No drainage.  Limited MSK ultrasound does not show definitive evidence of a foreign body  X-ray of the left foot including AP, lateral, and oblique views also show no evidence of foreign body      Assessment & Plan:   Questionable wooden foreign body, left foot  MRI specifically to evaluate for a wooden foreign body that would need to be excised.  I will follow-up via MyChart with those results when available and we will delineate further treatment based on those findings.  This note was dictated using Dragon naturally speaking software and may contain errors in syntax, spelling, or content which have not been identified prior to signing this note.

## 2023-10-31 ENCOUNTER — Ambulatory Visit
Admission: RE | Admit: 2023-10-31 | Discharge: 2023-10-31 | Disposition: A | Discharge status: Home / Self Care | Source: Ambulatory Visit | Attending: Sports Medicine | Admitting: Sports Medicine

## 2023-10-31 ENCOUNTER — Ambulatory Visit

## 2023-10-31 DIAGNOSIS — S99922A Unspecified injury of left foot, initial encounter: Secondary | ICD-10-CM | POA: Insufficient documentation

## 2023-10-31 DIAGNOSIS — M65972 Unspecified synovitis and tenosynovitis, left ankle and foot: Secondary | ICD-10-CM | POA: Diagnosis not present

## 2023-10-31 DIAGNOSIS — M778 Other enthesopathies, not elsewhere classified: Secondary | ICD-10-CM

## 2023-10-31 DIAGNOSIS — M19072 Primary osteoarthritis, left ankle and foot: Secondary | ICD-10-CM | POA: Diagnosis not present

## 2023-10-31 HISTORY — DX: Other enthesopathies, not elsewhere classified: M77.8

## 2023-11-05 ENCOUNTER — Telehealth: Payer: Self-pay | Admitting: Sports Medicine

## 2023-11-05 NOTE — Telephone Encounter (Signed)
 I spoke with Shannon Fields on the phone last week after reviewing the recent MRI of her left foot.  No evidence of large foreign body.  The area of interest seems to represent scarring in a region measuring up to approximately 3 x 12 x 12 mm.  A tiny wooden splinter could not be excluded.  Based on these findings, I recommended a period of watchful waiting. Shannon Fields will let me know if her foot becomes more symptomatic.  Follow-up as needed.

## 2024-01-15 ENCOUNTER — Other Ambulatory Visit: Payer: Self-pay | Admitting: Family Medicine

## 2024-01-15 DIAGNOSIS — Z0000 Encounter for general adult medical examination without abnormal findings: Secondary | ICD-10-CM

## 2024-01-17 ENCOUNTER — Ambulatory Visit: Admitting: Family Medicine

## 2024-01-25 ENCOUNTER — Other Ambulatory Visit (HOSPITAL_COMMUNITY): Payer: Self-pay

## 2024-01-25 DIAGNOSIS — H00011 Hordeolum externum right upper eyelid: Secondary | ICD-10-CM | POA: Diagnosis not present

## 2024-01-25 MED ORDER — AZITHROMYCIN 250 MG PO TABS
ORAL_TABLET | ORAL | 0 refills | Status: AC
  Filled 2024-01-25: qty 6, 5d supply, fill #0

## 2024-01-31 ENCOUNTER — Ambulatory Visit (INDEPENDENT_AMBULATORY_CARE_PROVIDER_SITE_OTHER): Admitting: Family Medicine

## 2024-01-31 ENCOUNTER — Encounter: Payer: Self-pay | Admitting: Family Medicine

## 2024-01-31 VITALS — BP 130/60 | Ht 62.0 in | Wt 140.0 lb

## 2024-01-31 DIAGNOSIS — Z79899 Other long term (current) drug therapy: Secondary | ICD-10-CM | POA: Diagnosis not present

## 2024-01-31 DIAGNOSIS — I10 Essential (primary) hypertension: Secondary | ICD-10-CM | POA: Diagnosis not present

## 2024-01-31 DIAGNOSIS — E785 Hyperlipidemia, unspecified: Secondary | ICD-10-CM

## 2024-01-31 DIAGNOSIS — Z0000 Encounter for general adult medical examination without abnormal findings: Secondary | ICD-10-CM | POA: Diagnosis not present

## 2024-01-31 DIAGNOSIS — E7800 Pure hypercholesterolemia, unspecified: Secondary | ICD-10-CM | POA: Diagnosis not present

## 2024-01-31 NOTE — Progress Notes (Unsigned)
 Shannon Fields is {Pc accompanied by:5710} Sources of clinical information for visit is/are {Information source:60032}. Nursing assessment for this office visit was reviewed with the patient for accuracy and revision.   Previous Report(s) Reviewed: {Outside review:15817}     01/30/2023   11:35 AM  Depression screen PHQ 2/9  Decreased Interest 0  Down, Depressed, Hopeless 0  PHQ - 2 Score 0  Altered sleeping 0  Tired, decreased energy 0  Change in appetite 0  Feeling bad or failure about yourself  0  Trouble concentrating 0  Moving slowly or fidgety/restless 0  Suicidal thoughts 0  PHQ-9 Score 0   Flowsheet Row Office Visit from 01/30/2023 in [facility] - [facility] Of [place]. [facility] Office Visit from 11/15/2021 in [facility] - [facility] Of [place] Office Visit from 04/28/2020 in [facility] - A Dept Of [place]. [facility]  Thoughts that you would be better off dead, or of hurting yourself in some way Not at all Not at all Not at all  PHQ-9 Total Score 0 2 4       01/30/2023   11:35 AM 11/15/2021   10:25 AM 10/06/2020    8:38 AM 05/19/2020    9:42 AM 07/10/2019    8:40 AM  Fall Risk   Falls in the past year? 0 0 0 0 0   Number falls in past yr: 0 0 0 0 0  Injury with Fall? 0 0 0 0      Data saved with a previous flowsheet row definition       01/30/2023   11:35 AM 11/15/2021   10:25 AM 05/19/2020    9:42 AM  PHQ9 SCORE ONLY  PHQ-9 Total Score 0  2  0      Data saved with a previous flowsheet row definition    There are no preventive care reminders to display for this patient.  Health Maintenance Due  Topic Date Due   Pneumococcal Vaccine: 50+ Years (1 of 2 - PCV) Never done   Zoster Vaccines- Shingrix (1 of 2) Never done   DTaP/Tdap/Td (3 - Td or Tdap) 12/20/2020   INFLUENZA VACCINE  12/28/2023   COVID-19 Vaccine (3 - 2025-26 season) 01/28/2024      History/P.E. limitations:  {exam; limitations ed:60112}  There are no preventive care reminders to display for this patient. There are no preventive care reminders to display for this patient.  Health Maintenance Due  Topic Date Due   Pneumococcal Vaccine: 50+ Years (1 of 2 - PCV) Never done   Zoster Vaccines- Shingrix (1 of 2) Never done   DTaP/Tdap/Td (3 - Td or Tdap) 12/20/2020   INFLUENZA VACCINE  12/28/2023   COVID-19 Vaccine (3 - 2025-26 season) 01/28/2024     Chief Complaint  Patient presents with   Annual Exam     Discussed the use of AI scribe software for clinical note transcription with the patient, who gave verbal consent to proceed.  History of Present Illness      SDOH Screenings   Food Insecurity: No Food Insecurity (01/30/2024)  Housing: Low Risk  (01/30/2024)  Transportation Needs: No Transportation Needs (01/30/2024)  Alcohol Screen: Low Risk  (01/30/2024)  Depression (PHQ2-9): Low Risk  (01/30/2023)  Financial Resource Strain: Low Risk  (01/30/2024)  Physical Activity: Sufficiently Active (01/30/2024)  Social Connections: Socially Integrated (01/30/2024)  Stress: No Stress Concern Present (  01/30/2024)  Tobacco Use: Low Risk  (08/02/2023)   --------------------------------------------------------------------------------------------------------------------------------------------- Visit Problem List with Assessment and Plan   Assessment and Plan Assessment & Plan      No problem-specific Assessment & Plan notes found for this encounter.

## 2024-02-01 ENCOUNTER — Encounter: Payer: Self-pay | Admitting: Family Medicine

## 2024-02-01 ENCOUNTER — Ambulatory Visit: Payer: Self-pay | Admitting: Family Medicine

## 2024-02-01 DIAGNOSIS — Z0000 Encounter for general adult medical examination without abnormal findings: Secondary | ICD-10-CM | POA: Insufficient documentation

## 2024-02-01 DIAGNOSIS — E785 Hyperlipidemia, unspecified: Secondary | ICD-10-CM | POA: Insufficient documentation

## 2024-02-01 LAB — LIPID PANEL
Chol/HDL Ratio: 3 ratio (ref 0.0–4.4)
Cholesterol, Total: 240 mg/dL — ABNORMAL HIGH (ref 100–199)
HDL: 81 mg/dL (ref >39)
LDL Chol Calc (NIH): 143 mg/dL — ABNORMAL HIGH (ref 0–99)
Triglycerides: 92 mg/dL (ref 0–149)
VLDL Cholesterol Cal: 16 mg/dL (ref 5–40)

## 2024-02-01 LAB — CMP14+EGFR
ALT: 21 IU/L (ref 0–32)
AST: 19 IU/L (ref 0–40)
Albumin: 4.4 g/dL (ref 3.9–4.9)
Alkaline Phosphatase: 48 IU/L (ref 44–121)
BUN/Creatinine Ratio: 21 (ref 12–28)
BUN: 15 mg/dL (ref 8–27)
Bilirubin Total: 0.5 mg/dL (ref 0.0–1.2)
CO2: 25 mmol/L (ref 20–29)
Calcium: 9.9 mg/dL (ref 8.7–10.3)
Chloride: 98 mmol/L (ref 96–106)
Creatinine, Ser: 0.73 mg/dL (ref 0.57–1.00)
Globulin, Total: 2.8 g/dL (ref 1.5–4.5)
Glucose: 105 mg/dL — ABNORMAL HIGH (ref 70–99)
Potassium: 4.4 mmol/L (ref 3.5–5.2)
Sodium: 137 mmol/L (ref 134–144)
Total Protein: 7.2 g/dL (ref 6.0–8.5)
eGFR: 93 mL/min/1.73 (ref >59)

## 2024-02-01 LAB — MICROALBUMIN / CREATININE URINE RATIO
Creatinine, Urine: 46.5 mg/dL
Microalb/Creat Ratio: <6 mg/g{creat} (ref 0–29)
Microalbumin, Urine: <3 ug/mL

## 2024-02-01 NOTE — Assessment & Plan Note (Signed)
 Established problem Not currently taking statin LDL 142 (but HDL is 81!) The 10-year ASCVD risk score (Arnett DK, et al., 2019) is: 5.1%   Values used to calculate the score:     Age: 63 years     Clincally relevant sex: Female     Is Non-Hispanic African American: No     Diabetic: No     Tobacco smoker: No     Systolic Blood Pressure: 130 mmHg     Is BP treated: Yes     HDL Cholesterol: 81 mg/dL     Total Cholesterol: 240 mg/dL  Apparently low C-RP level, and low LpA Only concern is the coronary calcification score.  Shannon Fields will consider whether or not to restart a statin.

## 2024-02-01 NOTE — Assessment & Plan Note (Signed)
 Established problem Well Controlled. Patient is at goal of <130/85. No signs of complications, medication side effects, or red flags. Continue indapamide  2.5 mg daily

## 2024-02-01 NOTE — Assessment & Plan Note (Signed)
 Discussed recommendations for Prevnar, Shingrix, and Tdap vaccinations Low risk BRC1/2 score No parental history of hip fracture.

## 2024-02-25 DIAGNOSIS — H5213 Myopia, bilateral: Secondary | ICD-10-CM | POA: Diagnosis not present

## 2024-03-12 ENCOUNTER — Ambulatory Visit
Admission: RE | Admit: 2024-03-12 | Discharge: 2024-03-12 | Disposition: A | Discharge status: Home / Self Care | Source: Ambulatory Visit | Attending: Family Medicine | Admitting: Family Medicine

## 2024-03-12 ENCOUNTER — Ambulatory Visit

## 2024-03-12 DIAGNOSIS — Z1231 Encounter for screening mammogram for malignant neoplasm of breast: Secondary | ICD-10-CM | POA: Diagnosis not present

## 2024-03-12 DIAGNOSIS — Z0000 Encounter for general adult medical examination without abnormal findings: Secondary | ICD-10-CM

## 2024-03-26 ENCOUNTER — Ambulatory Visit (INDEPENDENT_AMBULATORY_CARE_PROVIDER_SITE_OTHER): Admitting: Family Medicine

## 2024-03-26 VITALS — BP 110/78 | Ht 62.0 in | Wt 144.0 lb

## 2024-03-26 DIAGNOSIS — M25551 Pain in right hip: Secondary | ICD-10-CM

## 2024-03-26 NOTE — Patient Instructions (Signed)
  VISIT SUMMARY: You came in today because of hip pain that started after sleeping on the ground during a camping trip. The pain initially improved after a hike but came back the next day. It radiates down your leg but does not go past your knee, and it gets worse throughout the day. You have been using ice, heat, and ibuprofen  for relief. You have a history of pelvic asymmetry and leg length discrepancy, but you maintain an active lifestyle with regular swimming and hiking.  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 It is common in hikers because of repetitive stress. You should use topical Voltaren gel and take Tylenol  as needed for pain. Apply heat to the affected area for 15 minutes, 3-4 times daily. Perform the stretches and strengthening exercises provided by [facility]. You can continue your normal activities, including hiking and camping. If your symptoms do not improve, consider formal physical therapy.  INSTRUCTIONS: Follow up in 5-6 weeks or sooner if your symptoms worsen.

## 2024-03-27 NOTE — Progress Notes (Signed)
 PCP: Orie Milda CROME, MD  Discussed the use of AI scribe software for clinical note transcription with the patient, who gave verbal consent to proceed.  History of Present Illness Shannon Fields is a 63 year old female who presents with hip pain.  Right Hip pain - Onset after sleeping on the ground during a camping trip - Initially improved after a three-mile hike but recurred the following day - Pain radiates down the leg but does not extend past the knee - No associated numbness or tingling - Pain worsens throughout the day - No pain upon waking and does not disturb sleep - Avoids pressure on the affected leg by leaning on the contralateral leg - Uses ice, heat, and ibuprofen  once daily for symptom relief, with caution due to hypertension - No prior episodes of similar symptoms - Uncertain of the specific trigger for this episode  Musculoskeletal history - Previous evaluation for pelvic asymmetry and leg length discrepancy of 1.5 inches - No joint space in first metatarsophalangeal (MTP) joints - Presence of bone spurs  Functional status - Maintains an active lifestyle - Swims four days per week - Hikes regularly    Past Medical History:  Diagnosis Date   Benign esophageal stricture 08/02/2023   Dr Leigh - graduated dilation during EGD   Coronary artery calcification 06/27/2023   Diverticulosis    Esophagitis determined by endoscopy 08/06/2023   EGD Dr Leigh, 08/02/23: Grade 1 LA esophagitis     Flexor hallucis longus tendinitis 10/31/2023   MRI   Gastric inflammation 08/06/2023   EGD Dr Leigh (08/02/23): mild patchy gastric inflammation     GERD (gastroesophageal reflux disease)    History of diverticulitis 06/2019   History of esophageal stricture 08/02/2023   S/P dilation Dr Leigh   History of gastroesophageal reflux (GERD)    Osteoarthritis of joint of great toe of left foot 10/30/2023   KM:Emnhmzddpcz severe great toe metatarsophalangeal joint  osteoarthritis.   Osteoarthritis of joint of toe of left foot 10/31/2023   FMP:Fnizmjuz fourth, mild to moderate second, and mild rest of the tarsometatarsal osteoarthritis.   Osteopenia    Palpitations 01/26/2016   PMB (postmenopausal bleeding)    Postmenopausal bleeding    Thoracogenic scoliosis 08/30/2007   Qualifier: Diagnosis of   By: HARVEY MD, KARL         UNEQUAL LEG LENGTH 08/30/2007   Qualifier: Diagnosis of   By: HARVEY MD, KARL         Uterine fibroid    Uterine polyp 07/10/2012   Wears glasses    White coat syndrome without diagnosis of hypertension     Current Outpatient Medications on File Prior to Visit  Medication Sig Dispense Refill   acidophilus (RISAQUAD) CAPS capsule Take 1 capsule by mouth daily.     aspirin EC 81 MG tablet Take 81 mg by mouth daily. Swallow whole.     calcium -vitamin D (OSCAL WITH D) 500-200 MG-UNIT tablet Take 1 tablet by mouth 1 day or 1 dose.     HM GARLIC PO Take by mouth daily.     indapamide  (LOZOL ) 2.5 MG tablet Take 1 tablet (2.5 mg total) by mouth daily. 90 tablet 3   Magnesium 125 MG CAPS Take 250 mg by mouth in the morning and at bedtime. TAKES 2 TABLETS IN THE MORNING     Multiple Vitamin (MULTIVITAMIN) capsule Take 1 capsule by mouth daily.     Omega 3 1200 MG CAPS Take 1,200 mg by mouth.  Takes every day per pt     omeprazole  (PRILOSEC) 20 MG capsule Take 1 capsule (20 mg total) by mouth daily for 30 days then daily as needed for reflux symptoms 30 capsule 3   rosuvastatin  (CRESTOR ) 20 MG tablet Take 1 tablet (20 mg total) by mouth daily. (Patient not taking: Reported on 08/02/2023) 90 tablet 3   TAURINE PO Take 1,000 mg by mouth daily at 6 (six) AM.     triamcinolone  cream (KENALOG ) 0.1 % Apply as directed to affected area 2 (two) times daily as needed. 80 g 0   No current facility-administered medications on file prior to visit.    Past Surgical History:  Procedure Laterality Date   ABDOMINOPLASTY  12-02-2001  @[facility]    AND  VENTRAL HERNIA REPAIR   CESAREAN SECTION  x2  last one 07-05-2020 @ [facility]   AND BILATERAL TUBAL LIGATION W/ LAST C/S   DILATATION & CURETTAGE/HYSTEROSCOPY WITH MYOSURE N/A 06/10/2020   Procedure: DILATATION & CURETTAGE/HYSTEROSCOPY WITH MYOSURE;  Surgeon: Viktoria Comer SAUNDERS, MD;  Location: [facility];  Service: Gynecology;  Laterality: N/A;   DILATION AND CURETTAGE OF UTERUS  12/ 2021  @ gyn office w/ iv sedation   EPIGASTRIC HERNIA REPAIR  12-31-2000 @[facility]    HYSTEROSCOPY WITH D & C  07-10-2012  @[facility]    W/ TRUE CLEAR RESECTION POLYPECTOMY   INGUINAL HERNIA REPAIR Left 2005 approx.   per pt double hernia w/ 6 inch mesh   UMBILICAL HERNIA REPAIR  11-13-2000 @[facility]     Allergies  Allergen Reactions   Egg Protein-Containing Drug Products Rash    Childhood, per patient request    BP 110/78   Ht 5' 2 (1.575 m)   Wt 144 lb (65.3 kg)   LMP 12/27/2017   BMI 26.34 kg/m      02/22/2021    2:58 PM 04/14/2021   11:25 AM  [facility] Exercise  Frequency of aerobic exercise (# of days/week) 3 2  Average time in minutes 60 20  Frequency of strengthening activities (# of days/week) 0 3        No data to display              Objective:  Physical Exam:  Gen: NAD, comfortable in exam room  Right hip: No deformity. Full range of motion with 5/5 strength except 4/5 hip abduction. Tenderness to palpation greater trochanter and just proximal to this within gluteal tendons. Neurovascularly intact distally. Negative logroll Negative faber, fadir, and piriformis stretches. Mild positive ober, negative noble   Assessment & Plan Greater trochanteric pain syndrome of the right hip with gluteal tendinitis and trochanteric bursitis Pain localized to the lateral right hip with radiation down the thigh. Tenderness over the greater trochanter and gluteal muscle weakness noted. Differential diagnosis excluded other conditions. Common in hikers due to repetitive stress.  Not dangerous, no activity limitation required. - Use topical Voltaren gel for pain management. - Take Tylenol  as needed for pain. - Apply heat to the affected area for 15 minutes, 3-4 times daily. - Perform stretches and strengthening exercises as provided by [facility]. - Continue normal activities, including hiking and camping. - Consider formal physical therapy if symptoms do not improve. - Follow up in 5-6 weeks or sooner if symptoms worsen.

## 2024-04-14 DIAGNOSIS — H0011 Chalazion right upper eyelid: Secondary | ICD-10-CM | POA: Diagnosis not present

## 2024-04-28 ENCOUNTER — Other Ambulatory Visit (HOSPITAL_COMMUNITY): Payer: Self-pay

## 2024-04-29 ENCOUNTER — Other Ambulatory Visit (HOSPITAL_COMMUNITY): Payer: Self-pay

## 2024-05-19 DIAGNOSIS — Z13228 Encounter for screening for other metabolic disorders: Secondary | ICD-10-CM | POA: Diagnosis not present

## 2024-05-19 DIAGNOSIS — Z130 Encounter for screening for diseases of the blood and blood-forming organs and certain disorders involving the immune mechanism: Secondary | ICD-10-CM | POA: Diagnosis not present

## 2024-05-19 DIAGNOSIS — Z131 Encounter for screening for diabetes mellitus: Secondary | ICD-10-CM | POA: Diagnosis not present

## 2024-05-19 DIAGNOSIS — Z1321 Encounter for screening for nutritional disorder: Secondary | ICD-10-CM | POA: Diagnosis not present

## 2024-05-19 DIAGNOSIS — Z13820 Encounter for screening for osteoporosis: Secondary | ICD-10-CM | POA: Diagnosis not present

## 2024-05-19 DIAGNOSIS — Z1329 Encounter for screening for other suspected endocrine disorder: Secondary | ICD-10-CM | POA: Diagnosis not present

## 2024-05-19 DIAGNOSIS — Z13220 Encounter for screening for lipoid disorders: Secondary | ICD-10-CM | POA: Diagnosis not present

## 2024-05-19 DIAGNOSIS — Z01419 Encounter for gynecological examination (general) (routine) without abnormal findings: Secondary | ICD-10-CM | POA: Diagnosis not present

## 2024-05-19 DIAGNOSIS — Z6826 Body mass index (BMI) 26.0-26.9, adult: Secondary | ICD-10-CM | POA: Diagnosis not present

## 2024-06-06 ENCOUNTER — Other Ambulatory Visit: Payer: Self-pay

## 2024-06-06 ENCOUNTER — Other Ambulatory Visit (HOSPITAL_COMMUNITY): Payer: Self-pay
